# Patient Record
Sex: Female | Born: 1970
Health system: Southern US, Community
[De-identification: ages and names within clinical notes are randomized; demographics above are authoritative.]

## PROBLEM LIST (undated history)

## (undated) DIAGNOSIS — Z973 Presence of spectacles and contact lenses: Secondary | ICD-10-CM

## (undated) DIAGNOSIS — M1990 Unspecified osteoarthritis, unspecified site: Secondary | ICD-10-CM

## (undated) DIAGNOSIS — K219 Gastro-esophageal reflux disease without esophagitis: Secondary | ICD-10-CM

## (undated) DIAGNOSIS — E669 Obesity, unspecified: Secondary | ICD-10-CM

## (undated) DIAGNOSIS — I10 Essential (primary) hypertension: Secondary | ICD-10-CM

## (undated) DIAGNOSIS — G8929 Other chronic pain: Secondary | ICD-10-CM

## (undated) DIAGNOSIS — O00109 Unspecified tubal pregnancy without intrauterine pregnancy: Secondary | ICD-10-CM

## (undated) DIAGNOSIS — F419 Anxiety disorder, unspecified: Secondary | ICD-10-CM

## (undated) DIAGNOSIS — M549 Dorsalgia, unspecified: Secondary | ICD-10-CM

## (undated) DIAGNOSIS — Z87891 Personal history of nicotine dependence: Secondary | ICD-10-CM

## (undated) HISTORY — DX: Gastro-esophageal reflux disease without esophagitis: K21.9

## (undated) HISTORY — DX: Anxiety disorder, unspecified: F41.9

## (undated) HISTORY — DX: Unspecified osteoarthritis, unspecified site: M19.90

## (undated) HISTORY — DX: Presence of spectacles and contact lenses: Z97.3

## (undated) HISTORY — DX: Dorsalgia, unspecified: M54.9

## (undated) HISTORY — DX: Other chronic pain: G89.29

## (undated) HISTORY — DX: Obesity, unspecified: E66.9

## (undated) HISTORY — DX: Personal history of nicotine dependence: Z87.891

---

## 1997-10-15 HISTORY — PX: ECTOPIC PREGNANCY SURGERY: SHX613

## 2012-05-27 ENCOUNTER — Emergency Department (HOSPITAL_COMMUNITY): Payer: No Typology Code available for payment source

## 2012-05-27 ENCOUNTER — Encounter (HOSPITAL_COMMUNITY): Payer: Self-pay

## 2012-05-27 ENCOUNTER — Emergency Department (HOSPITAL_COMMUNITY)
Admission: EM | Admit: 2012-05-27 | Discharge: 2012-05-27 | Disposition: A | Discharge status: Home / Self Care | Payer: No Typology Code available for payment source | Attending: Emergency Medicine | Admitting: Emergency Medicine

## 2012-05-27 DIAGNOSIS — IMO0002 Reserved for concepts with insufficient information to code with codable children: Secondary | ICD-10-CM

## 2012-05-27 DIAGNOSIS — Y998 Other external cause status: Secondary | ICD-10-CM | POA: Diagnosis not present

## 2012-05-27 DIAGNOSIS — F17200 Nicotine dependence, unspecified, uncomplicated: Secondary | ICD-10-CM | POA: Insufficient documentation

## 2012-05-27 DIAGNOSIS — Y93I9 Activity, other involving external motion: Secondary | ICD-10-CM | POA: Insufficient documentation

## 2012-05-27 DIAGNOSIS — S139XXA Sprain of joints and ligaments of unspecified parts of neck, initial encounter: Secondary | ICD-10-CM | POA: Insufficient documentation

## 2012-05-27 DIAGNOSIS — S61209A Unspecified open wound of unspecified finger without damage to nail, initial encounter: Secondary | ICD-10-CM | POA: Diagnosis not present

## 2012-05-27 DIAGNOSIS — E119 Type 2 diabetes mellitus without complications: Secondary | ICD-10-CM | POA: Insufficient documentation

## 2012-05-27 DIAGNOSIS — Z043 Encounter for examination and observation following other accident: Secondary | ICD-10-CM | POA: Diagnosis present

## 2012-05-27 DIAGNOSIS — I10 Essential (primary) hypertension: Secondary | ICD-10-CM | POA: Diagnosis not present

## 2012-05-27 HISTORY — DX: Essential (primary) hypertension: I10

## 2012-05-27 MED ORDER — IBUPROFEN 800 MG PO TABS: 800.0000 mg | ORAL_TABLET | Freq: Three times a day (TID) | ORAL | Status: AC | PRN

## 2012-05-27 MED ORDER — OXYCODONE-ACETAMINOPHEN 5-325 MG PO TABS: 1.0000 | ORAL_TABLET | Freq: Once | ORAL | Status: DC

## 2012-05-27 MED ORDER — IBUPROFEN 800 MG PO TABS
800.0000 mg | ORAL_TABLET | Freq: Once | ORAL | Status: AC
  Administered 2012-05-27: 800 mg via ORAL
  Filled 2012-05-27: qty 1

## 2012-05-27 MED ORDER — ACETAMINOPHEN 325 MG PO TABS
975.0000 mg | ORAL_TABLET | Freq: Once | ORAL | Status: AC
  Administered 2012-05-27: 975 mg via ORAL
  Filled 2012-05-27: qty 3
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
## 2012-05-27 NOTE — ED Notes (Signed)
Pt ambulatory at discharge, pt discharged with son and spouse
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
## 2012-06-12 ENCOUNTER — Emergency Department (HOSPITAL_COMMUNITY)
Admission: EM | Admit: 2012-06-12 | Discharge: 2012-06-12 | Disposition: A | Discharge status: Home / Self Care | Payer: No Typology Code available for payment source | Attending: Emergency Medicine | Admitting: Emergency Medicine

## 2012-06-12 ENCOUNTER — Encounter (HOSPITAL_COMMUNITY): Payer: Self-pay | Admitting: *Deleted

## 2012-06-12 DIAGNOSIS — I10 Essential (primary) hypertension: Secondary | ICD-10-CM | POA: Insufficient documentation

## 2012-06-12 DIAGNOSIS — F17200 Nicotine dependence, unspecified, uncomplicated: Secondary | ICD-10-CM | POA: Insufficient documentation

## 2012-06-12 DIAGNOSIS — E119 Type 2 diabetes mellitus without complications: Secondary | ICD-10-CM | POA: Insufficient documentation

## 2012-06-12 DIAGNOSIS — M62838 Other muscle spasm: Secondary | ICD-10-CM | POA: Insufficient documentation

## 2012-06-12 HISTORY — DX: Unspecified tubal pregnancy without intrauterine pregnancy: O00.109

## 2012-06-12 MED ORDER — HYDROCODONE-ACETAMINOPHEN 5-325 MG PO TABS
2.0000 | ORAL_TABLET | Freq: Once | ORAL | Status: AC
  Administered 2012-06-12: 2 via ORAL
  Filled 2012-06-12: qty 2

## 2012-06-12 MED ORDER — DIAZEPAM 5 MG PO TABS
10.0000 mg | ORAL_TABLET | Freq: Once | ORAL | Status: AC
  Administered 2012-06-12: 10 mg via ORAL
  Filled 2012-06-12: qty 2

## 2012-06-12 MED ORDER — ONDANSETRON 4 MG PO TBDP
8.0000 mg | ORAL_TABLET | Freq: Once | ORAL | Status: AC
  Administered 2012-06-12: 8 mg via ORAL
  Filled 2012-06-12: qty 2

## 2012-06-12 MED ORDER — DIAZEPAM 5 MG/ML IJ SOLN
10.0000 mg | Freq: Once | INTRAMUSCULAR | Status: AC
  Administered 2012-06-12: 10 mg via INTRAMUSCULAR
  Filled 2012-06-12: qty 2

## 2012-06-12 MED ORDER — DIAZEPAM 5 MG PO TABS: 5.0000 mg | ORAL_TABLET | Freq: Four times a day (QID) | ORAL | Status: AC | PRN

## 2012-06-12 MED ORDER — HYDROCODONE-ACETAMINOPHEN 5-325 MG PO TABS: 2.0000 | ORAL_TABLET | ORAL | Status: AC | PRN

## 2012-06-12 MED ORDER — MORPHINE SULFATE 4 MG/ML IJ SOLN
4.0000 mg | Freq: Once | INTRAMUSCULAR | Status: AC
  Administered 2012-06-12: 4 mg via INTRAMUSCULAR
  Filled 2012-06-12: qty 1

## 2012-06-12 NOTE — ED Provider Notes (Signed)
Medical screening examination/treatment/procedure(s) were performed by non-physician practitioner and as supervising physician I was immediately available for consultation/collaboration.  Phinneas Shakoor, MD 06/12/12 0548 
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
## 2012-06-12 NOTE — ED Notes (Signed)
PT. REFUSED VALIUM AND MORPHINE INJECTION AS WELL AS ZOFRAN ODT , REQUESTED ORAL PAIN MEDICATION - P. DAMMEN PA NOTIFIED.
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
## 2012-08-17 ENCOUNTER — Other Ambulatory Visit: Payer: Self-pay | Admitting: Oncology

## 2012-10-31 ENCOUNTER — Encounter: Payer: Self-pay | Admitting: Medical

## 2012-10-31 ENCOUNTER — Ambulatory Visit (INDEPENDENT_AMBULATORY_CARE_PROVIDER_SITE_OTHER): Payer: Commercial Managed Care - PPO | Admitting: Medical

## 2012-10-31 VITALS — BP 130/70 | HR 83 | Temp 98.2°F | Resp 14 | Ht 66.0 in | Wt 214.0 lb

## 2012-10-31 DIAGNOSIS — I10 Essential (primary) hypertension: Secondary | ICD-10-CM

## 2012-10-31 DIAGNOSIS — F17200 Nicotine dependence, unspecified, uncomplicated: Secondary | ICD-10-CM

## 2012-10-31 DIAGNOSIS — R42 Dizziness and giddiness: Secondary | ICD-10-CM

## 2012-10-31 DIAGNOSIS — E119 Type 2 diabetes mellitus without complications: Secondary | ICD-10-CM

## 2012-10-31 LAB — CBC WITH DIFFERENTIAL/PLATELET
Eosinophils Relative: 4 % (ref 0–5)
HCT: 39.9 % (ref 36.0–46.0)
Hemoglobin: 13.1 g/dL (ref 12.0–15.0)
Lymphocytes Relative: 40 % (ref 12–46)
Lymphs Abs: 2.5 10*3/uL (ref 0.7–4.0)
MCV: 86 fL (ref 78.0–100.0)
Monocytes Absolute: 0.6 10*3/uL (ref 0.1–1.0)
Monocytes Relative: 10 % (ref 3–12)
Neutro Abs: 2.9 10*3/uL (ref 1.7–7.7)
RBC: 4.64 MIL/uL (ref 3.87–5.11)
WBC: 6.3 10*3/uL (ref 4.0–10.5)

## 2012-10-31 LAB — POCT URINALYSIS DIPSTICK
Bilirubin, UA: NEGATIVE
Blood, UA: NEGATIVE
Glucose, UA: NEGATIVE
Nitrite, UA: NEGATIVE
Spec Grav, UA: <=1.005
Urobilinogen, UA: NEGATIVE

## 2012-10-31 NOTE — Progress Notes (Signed)
Subjective:   HPI  Kelsey Olson is a 42 y.o. female who presents as a new patient today.  Moved from [place].  Diagnosed with diabetes in 2005.  Was originally on insulin and oral medication, but over time lost weight and did better with diet and exercise.  Not just taking metformin once daily.   She is on lisinopril once daily at 20mg  daily.  She thinks her medication is giving her problems.   Takes lisinopril on empty stomach in the morning, and within a short period of time gets dizziness and jitteriness.   At times feels like she may pass out, but hasn't fainted.  Sometimes seems like BP is low.  She doesn't check her BP regularly though.  She does check glucose which runs 90-115 fasting in the mornings.  She hasn't had labs in almost 2 years.  otherwise been in usual state of health without c/o. No other aggravating or relieving factors.    No other c/o.  The following portions of the patient's history were reviewed and updated as appropriate: allergies, current medications, past family history, past medical history, past social history, past surgical history and problem list.  Past Medical History  Diagnosis Date  . Hypertension   . Tubal pregnancy   . Tobacco use disorder   . Diabetes mellitus 2005  . Wears glasses   . Anxiety   . Chronic back pain     Allergies  Allergen Reactions  . Percocet (Oxycodone-Acetaminophen) Itching and Other (See Comments)    Hallucinations   Review of Systems Constitutional: -fever, -chills, -sweats, -unexpected weight change,-fatigue ENT: -runny nose, -ear pain, -sore throat Cardiology:  -chest pain, -palpitations, -edema Respiratory: -cough, -shortness of breath, -wheezing Gastroenterology: -abdominal pain, -nausea, -vomiting, -diarrhea, -constipation  Hematology: -bleeding or bruising problems Musculoskeletal: -arthralgias, -myalgias, -joint swelling, -back pain Ophthalmology: -vision changes Urology: -dysuria, -difficulty  urinating, -hematuria, -urinary frequency, -urgency Neurology: -headache, -weakness, -tingling, -numbness      Objective:   Physical Exam  General appearance: alert, no distress, WD/WN, pleasant AA female HEENT: normocephalic, sclerae anicteric, TMs pearly, nares patent, no discharge or erythema, pharynx normal Oral cavity: MMM, no lesions Neck: supple, no lymphadenopathy, no thyromegaly, no masses, no bruits Heart: RRR, normal S1, S2, no murmurs Lungs: CTA bilaterally, no wheezes, rhonchi, or rales Abdomen: +bs, soft, non tender, non distended, no masses, no hepatomegaly, no splenomegaly Pulses: 2+ symmetric, upper and lower extremities, normal cap refill Neuro: CN2-12 intact, nonfocal exam  Assessment and Plan :     Encounter Diagnoses  Name Primary?  . Type II or unspecified type diabetes mellitus without mention of complication, not stated as uncontrolled Yes  . Essential hypertension, benign   . Dizziness   . Tobacco use disorder    Labs today, c/t current medications.   Symptoms suggests possibly an intolerance to Lisinopril, but will await labs.  I asked her to records BP measurements and glucose measurements fasting and at bedtime and return these in 1wk for me to review.  Advised she work on quitting tobacco.  F/u pending labs.

## 2012-11-01 LAB — COMPREHENSIVE METABOLIC PANEL
ALT: 16 U/L (ref 0–35)
BUN: 9 mg/dL (ref 6–23)
CO2: 28 mEq/L (ref 19–32)
Calcium: 9.4 mg/dL (ref 8.4–10.5)
Creat: 0.97 mg/dL (ref 0.50–1.10)
Total Bilirubin: 0.4 mg/dL (ref 0.3–1.2)

## 2012-11-01 LAB — MICROALBUMIN / CREATININE URINE RATIO: Creatinine, Urine: 96.3 mg/dL

## 2012-11-01 LAB — LIPID PANEL
Cholesterol: 189 mg/dL (ref 0–200)
Total CHOL/HDL Ratio: 5 Ratio
Triglycerides: 143 mg/dL (ref <150)
VLDL: 29 mg/dL (ref 0–40)

## 2012-11-01 LAB — HEMOGLOBIN A1C: Mean Plasma Glucose: 146 mg/dL — ABNORMAL HIGH (ref <117)

## 2012-11-29 ENCOUNTER — Other Ambulatory Visit: Payer: Self-pay

## 2013-01-23 ENCOUNTER — Other Ambulatory Visit (INDEPENDENT_AMBULATORY_CARE_PROVIDER_SITE_OTHER): Payer: Commercial Managed Care - PPO

## 2013-01-23 DIAGNOSIS — Z111 Encounter for screening for respiratory tuberculosis: Secondary | ICD-10-CM

## 2013-03-01 ENCOUNTER — Emergency Department (HOSPITAL_COMMUNITY)
Admission: EM | Admit: 2013-03-01 | Discharge: 2013-03-02 | Disposition: A | Discharge status: Home / Self Care | Payer: Commercial Managed Care - PPO | Attending: Emergency Medicine | Admitting: Emergency Medicine

## 2013-03-01 ENCOUNTER — Encounter (HOSPITAL_COMMUNITY): Payer: Self-pay | Admitting: Emergency Medicine

## 2013-03-01 DIAGNOSIS — E119 Type 2 diabetes mellitus without complications: Secondary | ICD-10-CM | POA: Insufficient documentation

## 2013-03-01 DIAGNOSIS — I10 Essential (primary) hypertension: Secondary | ICD-10-CM

## 2013-03-01 DIAGNOSIS — G8929 Other chronic pain: Secondary | ICD-10-CM | POA: Insufficient documentation

## 2013-03-01 DIAGNOSIS — R51 Headache: Secondary | ICD-10-CM | POA: Insufficient documentation

## 2013-03-01 DIAGNOSIS — F411 Generalized anxiety disorder: Secondary | ICD-10-CM | POA: Insufficient documentation

## 2013-03-01 DIAGNOSIS — Z8742 Personal history of other diseases of the female genital tract: Secondary | ICD-10-CM | POA: Insufficient documentation

## 2013-03-01 DIAGNOSIS — Z79899 Other long term (current) drug therapy: Secondary | ICD-10-CM | POA: Insufficient documentation

## 2013-03-01 DIAGNOSIS — F17200 Nicotine dependence, unspecified, uncomplicated: Secondary | ICD-10-CM | POA: Insufficient documentation

## 2013-03-01 DIAGNOSIS — Z885 Allergy status to narcotic agent status: Secondary | ICD-10-CM | POA: Insufficient documentation

## 2013-03-01 NOTE — ED Provider Notes (Signed)
History     CSN: [id number]  Arrival date & time 03/01/13  2258   First MD Initiated Contact with Patient 03/01/13 2310      Chief Complaint  Patient presents with  . Hypertension    (Consider location/radiation/quality/duration/timing/severity/associated sxs/prior treatment) HPI Comments: Patient with a history of HTN presents today due to an elevated blood pressure.  She reports that she monitors her blood pressure at home and her blood pressure was 180/80 earlier at home.  She states that her blood pressure typically runs around 130/80.  She is currently on 20 mg Lisinopril daily.  She has been taking the medication as directed.  Last dose was earlier this morning.  Patient denies headache, SOB, chest pain, vision changes, or decreased urination at this time.    The history is provided by the patient.    Past Medical History  Diagnosis Date  . Hypertension   . Tubal pregnancy   . Tobacco use disorder   . Diabetes mellitus 2005  . Wears glasses   . Anxiety   . Chronic back pain     Past Surgical History  Procedure Laterality Date  . Total abdominal hysterectomy w/ bilateral salpingoophorectomy      R    No family history on file.  History  Substance Use Topics  . Smoking status: Current Every Day Smoker -- 0.50 packs/day  . Smokeless tobacco: Not on file  . Alcohol Use: No    OB History   Grav Para Term Preterm Abortions TAB SAB Ect Mult Living                  Review of Systems  Constitutional: Negative for fever and chills.  Respiratory: Negative for shortness of breath.   Cardiovascular: Negative for chest pain.  Genitourinary: Negative for decreased urine volume.  Neurological: Negative for dizziness, syncope, light-headedness and headaches.  All other systems reviewed and are negative.    Allergies  Percocet  Home Medications   Current Outpatient Rx  Name  Route  Sig  Dispense  Refill  . lisinopril (PRINIVIL,ZESTRIL) 20 MG tablet   Oral  Take 20 mg by mouth daily.         . metFORMIN (GLUCOPHAGE) 500 MG tablet   Oral   Take 500 mg by mouth daily with breakfast.          . Multiple Vitamin (MULTIVITAMIN WITH MINERALS) TABS   Oral   Take 1 tablet by mouth daily.           BP 189/72  Pulse 82  Temp(Src) 98.2 F (36.8 C) (Oral)  Resp 16  SpO2 99%  Physical Exam  Nursing note and vitals reviewed. Constitutional: She appears well-developed and well-nourished. No distress.  HENT:  Head: Normocephalic and atraumatic.  Eyes: EOM are normal. Pupils are equal, round, and reactive to light.  Fundoscopic exam:      The right eye shows no hemorrhage and no papilledema.       The left eye shows no hemorrhage and no papilledema.  Neck: Normal range of motion. Neck supple.  Cardiovascular: Normal rate, regular rhythm, normal heart sounds and intact distal pulses.   Pulmonary/Chest: Effort normal and breath sounds normal. No respiratory distress. She has no wheezes. She has no rales.  Abdominal: Soft. There is no tenderness.  Neurological: She is alert. She has normal strength. No cranial nerve deficit or sensory deficit. Coordination and gait normal.  Skin: Skin is warm and dry. She is not  diaphoretic.  Psychiatric: She has a normal mood and affect.    ED Course  Procedures (including critical care time)  Labs Reviewed - No data to display No results found.   No diagnosis found.  11:41 PM Repeat blood pressure 140/70  MDM  Patient with a history of HTN presenting with a chief complaint of elevated blood pressure.  She is currently asymptomatic.  No signs of end organ damage.  Therefore, feel that the patient is stable for discharge.  Patient instructed to keep track of her blood pressure in a log and follow up with her PCP.        Pascal Lux [place], PA-C 03/02/13 2158

## 2013-03-01 NOTE — ED Notes (Signed)
Patient states he has had a moderate to severe HA and HTN since late yesterday evening. Pt states she takes 20 mg Lisinopril in at a.m. States tonight her BP has been as high as 180/90. States normally her BP is well controlled and averages around 130/70. States she has been taking her Lisinopril exactly as prescribed. Ax4, NAD.

## 2013-03-03 ENCOUNTER — Telehealth: Payer: Self-pay | Admitting: Family Medicine

## 2013-03-03 NOTE — Telephone Encounter (Signed)
Message copied by Janeice Robinson on Tue Mar 03, 2013  3:21 PM ------      Message from: Aleen Campi, DAVID S      Created: Mon Mar 02, 2013  7:14 AM       Need ED f/u OV here.  She came here as new patient in 10/2012.    ------

## 2013-03-03 NOTE — Telephone Encounter (Signed)
Tried to call this patient no answer no voicemail. CLS

## 2013-03-04 NOTE — ED Provider Notes (Signed)
History/physical exam/procedure(s) were performed by non-physician practitioner and as supervising physician I was immediately available for consultation/collaboration. I have reviewed all notes and am in agreement with care and plan.   Hilario Quarry, MD 03/04/13 [phone]

## 2013-03-10 ENCOUNTER — Telehealth: Payer: Self-pay | Admitting: Internal Medicine

## 2013-03-10 MED ORDER — METFORMIN HCL 500 MG PO TABS: 500.0000 mg | ORAL_TABLET | Freq: Every day | ORAL | Status: DC

## 2013-03-10 NOTE — Telephone Encounter (Signed)
Pt has 2 pills left and has appt next Wednesday June 4th so i have sent med for a 30 day supply to pharmacy

## 2013-03-11 ENCOUNTER — Institutional Professional Consult (permissible substitution): Payer: Commercial Managed Care - PPO | Admitting: Medical

## 2013-03-18 ENCOUNTER — Ambulatory Visit (INDEPENDENT_AMBULATORY_CARE_PROVIDER_SITE_OTHER): Payer: Commercial Managed Care - PPO | Admitting: Medical

## 2013-03-18 ENCOUNTER — Encounter: Payer: Self-pay | Admitting: Medical

## 2013-03-18 VITALS — BP 112/70 | HR 76 | Temp 98.4°F | Resp 16 | Wt 210.0 lb

## 2013-03-18 DIAGNOSIS — M47812 Spondylosis without myelopathy or radiculopathy, cervical region: Secondary | ICD-10-CM

## 2013-03-18 DIAGNOSIS — F17200 Nicotine dependence, unspecified, uncomplicated: Secondary | ICD-10-CM

## 2013-03-18 DIAGNOSIS — M79645 Pain in left finger(s): Secondary | ICD-10-CM

## 2013-03-18 DIAGNOSIS — M79609 Pain in unspecified limb: Secondary | ICD-10-CM

## 2013-03-18 DIAGNOSIS — E119 Type 2 diabetes mellitus without complications: Secondary | ICD-10-CM

## 2013-03-18 DIAGNOSIS — I10 Essential (primary) hypertension: Secondary | ICD-10-CM

## 2013-03-18 DIAGNOSIS — E785 Hyperlipidemia, unspecified: Secondary | ICD-10-CM

## 2013-03-18 LAB — LIPID PANEL
Cholesterol: 156 mg/dL (ref 0–200)
HDL: 35 mg/dL — ABNORMAL LOW (ref >39)
Total CHOL/HDL Ratio: 4.5 Ratio

## 2013-03-18 LAB — HEMOGLOBIN A1C
Hgb A1c MFr Bld: 5.8 % — ABNORMAL HIGH (ref <5.7)
Mean Plasma Glucose: 120 mg/dL — ABNORMAL HIGH (ref <117)
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
## 2013-03-19 ENCOUNTER — Other Ambulatory Visit: Payer: Self-pay | Admitting: Medical

## 2013-03-19 MED ORDER — METFORMIN HCL 500 MG PO TABS: 500.0000 mg | ORAL_TABLET | Freq: Every day | ORAL | Status: DC

## 2013-03-19 MED ORDER — LISINOPRIL 20 MG PO TABS: 20.0000 mg | ORAL_TABLET | Freq: Every day | ORAL | Status: DC

## 2013-03-19 MED ORDER — ATORVASTATIN CALCIUM 20 MG PO TABS: 20.0000 mg | ORAL_TABLET | Freq: Every day | ORAL | Status: DC

## 2013-04-08 ENCOUNTER — Encounter: Payer: Self-pay | Admitting: Medical

## 2013-04-08 ENCOUNTER — Ambulatory Visit (INDEPENDENT_AMBULATORY_CARE_PROVIDER_SITE_OTHER): Payer: Commercial Managed Care - PPO | Admitting: Medical

## 2013-04-08 VITALS — BP 136/70 | HR 100 | Temp 98.3°F | Resp 16 | Wt 210.0 lb

## 2013-04-08 DIAGNOSIS — M25561 Pain in right knee: Secondary | ICD-10-CM

## 2013-04-08 DIAGNOSIS — M47812 Spondylosis without myelopathy or radiculopathy, cervical region: Secondary | ICD-10-CM

## 2013-04-08 DIAGNOSIS — E669 Obesity, unspecified: Secondary | ICD-10-CM

## 2013-04-08 DIAGNOSIS — M25569 Pain in unspecified knee: Secondary | ICD-10-CM

## 2013-04-08 DIAGNOSIS — M549 Dorsalgia, unspecified: Secondary | ICD-10-CM

## 2013-04-08 MED ORDER — MELOXICAM 7.5 MG PO TABS: ORAL_TABLET | ORAL | Status: DC

## 2013-04-08 MED ORDER — METFORMIN HCL 500 MG PO TABS: 500.0000 mg | ORAL_TABLET | Freq: Every day | ORAL | Status: DC
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
## 2013-04-09 ENCOUNTER — Emergency Department (HOSPITAL_COMMUNITY)
Admission: EM | Admit: 2013-04-09 | Discharge: 2013-04-09 | Disposition: A | Discharge status: Home / Self Care | Payer: Commercial Managed Care - PPO | Attending: Emergency Medicine | Admitting: Emergency Medicine

## 2013-04-09 ENCOUNTER — Emergency Department (HOSPITAL_COMMUNITY): Payer: Commercial Managed Care - PPO

## 2013-04-09 ENCOUNTER — Encounter (HOSPITAL_COMMUNITY): Payer: Self-pay | Admitting: *Deleted

## 2013-04-09 DIAGNOSIS — G8929 Other chronic pain: Secondary | ICD-10-CM | POA: Insufficient documentation

## 2013-04-09 DIAGNOSIS — R42 Dizziness and giddiness: Secondary | ICD-10-CM | POA: Insufficient documentation

## 2013-04-09 DIAGNOSIS — R109 Unspecified abdominal pain: Secondary | ICD-10-CM | POA: Insufficient documentation

## 2013-04-09 DIAGNOSIS — Z79899 Other long term (current) drug therapy: Secondary | ICD-10-CM | POA: Insufficient documentation

## 2013-04-09 DIAGNOSIS — Z789 Other specified health status: Secondary | ICD-10-CM | POA: Insufficient documentation

## 2013-04-09 DIAGNOSIS — I10 Essential (primary) hypertension: Secondary | ICD-10-CM | POA: Insufficient documentation

## 2013-04-09 DIAGNOSIS — Z8659 Personal history of other mental and behavioral disorders: Secondary | ICD-10-CM | POA: Insufficient documentation

## 2013-04-09 DIAGNOSIS — Z3202 Encounter for pregnancy test, result negative: Secondary | ICD-10-CM | POA: Insufficient documentation

## 2013-04-09 DIAGNOSIS — F17200 Nicotine dependence, unspecified, uncomplicated: Secondary | ICD-10-CM | POA: Insufficient documentation

## 2013-04-09 DIAGNOSIS — E119 Type 2 diabetes mellitus without complications: Secondary | ICD-10-CM | POA: Insufficient documentation

## 2013-04-09 LAB — URINALYSIS, ROUTINE W REFLEX MICROSCOPIC
Glucose, UA: NEGATIVE mg/dL
Hgb urine dipstick: NEGATIVE
Leukocytes, UA: NEGATIVE
Protein, ur: NEGATIVE mg/dL
pH: 6 (ref 5.0–8.0)
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
## 2013-04-09 NOTE — ED Notes (Signed)
Pt also c/o discomfort under left breast.  St's feels like a bubble.  Onset while waiting to be seen for back pain.  Pt st's she was at home this pm when she felt like she was having a spasm in lower back.  St's pain better now just feels tight.
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
## 2013-04-09 NOTE — ED Notes (Signed)
Pt denies any dizziness

## 2013-04-14 ENCOUNTER — Ambulatory Visit: Payer: No Typology Code available for payment source | Attending: Medical

## 2013-04-14 DIAGNOSIS — M542 Cervicalgia: Secondary | ICD-10-CM | POA: Insufficient documentation

## 2013-04-14 DIAGNOSIS — R5381 Other malaise: Secondary | ICD-10-CM | POA: Insufficient documentation

## 2013-04-14 DIAGNOSIS — M545 Low back pain, unspecified: Secondary | ICD-10-CM | POA: Insufficient documentation

## 2013-04-14 DIAGNOSIS — IMO0001 Reserved for inherently not codable concepts without codable children: Secondary | ICD-10-CM | POA: Insufficient documentation

## 2013-04-14 DIAGNOSIS — M25569 Pain in unspecified knee: Secondary | ICD-10-CM | POA: Insufficient documentation

## 2013-04-15 ENCOUNTER — Ambulatory Visit: Payer: No Typology Code available for payment source | Admitting: Physical Therapy

## 2013-04-15 ENCOUNTER — Telehealth: Payer: Self-pay | Admitting: Medical

## 2013-04-15 NOTE — Telephone Encounter (Signed)
Recommendations:  Increase water and fiber intake  Can use short term Colace or Docusate OTC for stool softener  If desired can use OTC saline enema  Can eat some prunes or drink some prune juice, or can try few tablespoons of molasses  If none of the above is improving, can begin 1 cap full of Miralax OTC

## 2013-04-15 NOTE — Telephone Encounter (Signed)
Pt called and stated that she has been constipated for a couple of days and would like a recommendation for something otc. Please call pt.

## 2013-04-20 NOTE — Telephone Encounter (Signed)
Patient is aware of Shane Tysinger PAC recommendations. CLS 

## 2013-04-21 ENCOUNTER — Ambulatory Visit: Payer: No Typology Code available for payment source | Admitting: Physical Therapy

## 2013-04-23 ENCOUNTER — Ambulatory Visit: Payer: No Typology Code available for payment source

## 2013-04-29 ENCOUNTER — Ambulatory Visit: Payer: No Typology Code available for payment source | Admitting: Physical Therapy

## 2013-04-30 ENCOUNTER — Ambulatory Visit: Payer: No Typology Code available for payment source

## 2013-05-06 ENCOUNTER — Ambulatory Visit: Payer: No Typology Code available for payment source | Admitting: Physical Therapy

## 2013-05-07 ENCOUNTER — Ambulatory Visit: Payer: No Typology Code available for payment source

## 2013-05-11 ENCOUNTER — Ambulatory Visit: Payer: No Typology Code available for payment source

## 2013-05-13 ENCOUNTER — Ambulatory Visit: Payer: No Typology Code available for payment source | Admitting: Physical Therapy

## 2013-05-15 ENCOUNTER — Ambulatory Visit: Payer: No Typology Code available for payment source | Attending: Medical | Admitting: Physical Therapy

## 2013-05-15 DIAGNOSIS — M542 Cervicalgia: Secondary | ICD-10-CM | POA: Insufficient documentation

## 2013-05-15 DIAGNOSIS — M545 Low back pain, unspecified: Secondary | ICD-10-CM | POA: Insufficient documentation

## 2013-05-15 DIAGNOSIS — M25569 Pain in unspecified knee: Secondary | ICD-10-CM | POA: Insufficient documentation

## 2013-05-15 DIAGNOSIS — IMO0001 Reserved for inherently not codable concepts without codable children: Secondary | ICD-10-CM | POA: Insufficient documentation

## 2013-05-15 DIAGNOSIS — R5381 Other malaise: Secondary | ICD-10-CM | POA: Insufficient documentation

## 2013-05-20 ENCOUNTER — Ambulatory Visit: Payer: No Typology Code available for payment source | Admitting: Physical Therapy

## 2013-05-22 ENCOUNTER — Ambulatory Visit: Payer: No Typology Code available for payment source | Admitting: Physical Therapy

## 2013-06-10 ENCOUNTER — Other Ambulatory Visit: Payer: Self-pay | Admitting: Orthopedic Surgery

## 2013-06-10 DIAGNOSIS — M25562 Pain in left knee: Secondary | ICD-10-CM

## 2013-06-10 DIAGNOSIS — M25561 Pain in right knee: Secondary | ICD-10-CM

## 2013-06-13 ENCOUNTER — Other Ambulatory Visit: Payer: Commercial Managed Care - PPO

## 2013-06-18 ENCOUNTER — Telehealth: Payer: Self-pay | Admitting: Internal Medicine

## 2013-06-18 NOTE — Telephone Encounter (Signed)
Call in test strips

## 2013-06-18 NOTE — Telephone Encounter (Signed)
Pt states she needs test strips for rely on consirm mirco. It is a [facility] brand so she needs it sent to [facility] [address]

## 2013-06-19 ENCOUNTER — Telehealth: Payer: Self-pay | Admitting: Family Medicine

## 2013-06-19 NOTE — Telephone Encounter (Signed)
I fax over her Rx for test strips to her [facility] pharmacy per the patients request. CLS

## 2013-06-19 NOTE — Telephone Encounter (Signed)
I change the patients pharmacy to call out a rx for test strips. CLS

## 2013-06-21 ENCOUNTER — Other Ambulatory Visit: Payer: Commercial Managed Care - PPO

## 2013-08-20 ENCOUNTER — Other Ambulatory Visit: Payer: Self-pay

## 2013-09-12 ENCOUNTER — Emergency Department (HOSPITAL_COMMUNITY)
Admission: EM | Admit: 2013-09-12 | Discharge: 2013-09-13 | Disposition: A | Discharge status: Home / Self Care | Payer: Commercial Managed Care - PPO | Attending: Emergency Medicine | Admitting: Emergency Medicine

## 2013-09-12 DIAGNOSIS — G8929 Other chronic pain: Secondary | ICD-10-CM | POA: Insufficient documentation

## 2013-09-12 DIAGNOSIS — F410 Panic disorder [episodic paroxysmal anxiety] without agoraphobia: Secondary | ICD-10-CM | POA: Insufficient documentation

## 2013-09-12 DIAGNOSIS — E119 Type 2 diabetes mellitus without complications: Secondary | ICD-10-CM | POA: Insufficient documentation

## 2013-09-12 DIAGNOSIS — I10 Essential (primary) hypertension: Secondary | ICD-10-CM | POA: Insufficient documentation

## 2013-09-12 DIAGNOSIS — F17200 Nicotine dependence, unspecified, uncomplicated: Secondary | ICD-10-CM | POA: Insufficient documentation

## 2013-09-12 DIAGNOSIS — Z79899 Other long term (current) drug therapy: Secondary | ICD-10-CM | POA: Insufficient documentation

## 2013-09-12 LAB — POCT I-STAT, CHEM 8
BUN: 13 mg/dL (ref 6–23)
Calcium, Ion: 1.26 mmol/L — ABNORMAL HIGH (ref 1.12–1.23)
Chloride: 102 mEq/L (ref 96–112)
Creatinine, Ser: 1.3 mg/dL — ABNORMAL HIGH (ref 0.50–1.10)
TCO2: 29 mmol/L (ref 0–100)

## 2013-09-12 LAB — POCT I-STAT TROPONIN I

## 2013-09-12 NOTE — ED Notes (Signed)
While watching TV experienced dizziness; checked hr (136), bp 127/65. No cp, no sob. Pt. Ambulatory.

## 2013-09-13 LAB — URINALYSIS, ROUTINE W REFLEX MICROSCOPIC
Bilirubin Urine: NEGATIVE
Hgb urine dipstick: NEGATIVE
Nitrite: NEGATIVE
Specific Gravity, Urine: 1.01 (ref 1.005–1.030)
pH: 5.5 (ref 5.0–8.0)

## 2013-09-13 MED ORDER — SODIUM CHLORIDE 0.9 % IV BOLUS (SEPSIS)
1000.0000 mL | Freq: Once | INTRAVENOUS | Status: AC
  Administered 2013-09-13: 1000 mL via INTRAVENOUS

## 2013-09-13 NOTE — ED Provider Notes (Signed)
CSN: [id number]     Arrival date & time 09/12/13  2247 History   First MD Initiated Contact with Patient 09/12/13 2330     Chief Complaint  Patient presents with  . Tachycardia  . Dizziness   (Consider location/radiation/quality/duration/timing/severity/associated sxs/prior Treatment) HPI History provided by pt.   Pt reports that ~75min prior to arrival, she was sitting on her couch watching TV when she developed lightheadedness, hot flash, hyperventilation, tremulousness and sensation that she had to have  BM.  Sx gradually improved over several minutes, she checked her BP and it was nml, but HR 136.  Sx have been waxing and waning since then but first episode most severe.  Has not had CP, SOB, palpitations, N/V, diaphoresis.  Has had these sx in the past but to a much milder degree and has been diagnosed w/ anxiety.  Also has h/o diabetes and HTN.  Past Medical History  Diagnosis Date  . Hypertension   . Tubal pregnancy   . Tobacco use disorder   . Diabetes mellitus 2005  . Wears glasses   . Anxiety   . Chronic back pain    Past Surgical History  Procedure Laterality Date  . Total abdominal hysterectomy w/ bilateral salpingoophorectomy      R   No family history on file. History  Substance Use Topics  . Smoking status: Current Every Day Smoker -- 0.50 packs/day  . Smokeless tobacco: Not on file  . Alcohol Use: No   OB History   Grav Para Term Preterm Abortions TAB SAB Ect Mult Living                 Review of Systems  All other systems reviewed and are negative.    Allergies  Percocet  Home Medications   Current Outpatient Rx  Name  Route  Sig  Dispense  Refill  . ibuprofen (ADVIL,MOTRIN) 200 MG tablet   Oral   Take 200-400 mg by mouth every 6 (six) hours as needed for headache.         . lisinopril (PRINIVIL,ZESTRIL) 20 MG tablet   Oral   Take 1 tablet (20 mg total) by mouth daily.   90 tablet   3   . metFORMIN (GLUCOPHAGE) 500 MG tablet   Oral  Take 1 tablet (500 mg total) by mouth daily with breakfast.   90 tablet   1    BP 156/68  Pulse 87  Temp(Src) 98.4 F (36.9 C) (Oral)  Resp 16  Ht 5\' 7"  (1.702 m)  Wt 216 lb (97.977 kg)  BMI 33.82 kg/m2  SpO2 99%  LMP 08/21/2013 Physical Exam  Nursing note and vitals reviewed. Constitutional: She is oriented to person, place, and time. She appears well-developed and well-nourished. No distress.  HENT:  Head: Normocephalic and atraumatic.  Eyes:  Normal appearance  Neck: Normal range of motion.  Cardiovascular: Normal rate, regular rhythm and intact distal pulses.   HR 86  Pulmonary/Chest: Effort normal and breath sounds normal. No respiratory distress.  No pleuritic pain reported  Abdominal: Soft. Bowel sounds are normal. She exhibits no distension. There is no tenderness. There is no guarding.  Musculoskeletal: Normal range of motion.  No peripheral edema or calf tenderness  Neurological: She is alert and oriented to person, place, and time.  Skin: Skin is warm and dry. No rash noted.  Psychiatric: She has a normal mood and affect. Her behavior is normal.  Pt does not appear anxious    ED Course  Procedures (including critical care time) Labs Review Labs Reviewed  POCT I-STAT, CHEM 8 - Abnormal; Notable for the following:    Creatinine, Ser 1.30 (*)    Glucose, Bld 143 (*)    Calcium, Ion 1.26 (*)    All other components within normal limits  POCT I-STAT TROPONIN I   Imaging Review No results found.  EKG Interpretation    Date/Time:  Saturday September 12 2013 22:52:34 EST Ventricular Rate:  82 PR Interval:  164 QRS Duration: 84 QT Interval:  352 QTC Calculation: 411 R Axis:   86 Text Interpretation:  Normal sinus rhythm with sinus arrhythmia Nonspecific T wave abnormality w isolated t wave inversion in III Abnormal ECG Confirmed by HARRISON  MD, FORREST ([id number]) on 09/12/2013 11:14:49 PM            MDM   1. Anxiety attack    42yo F w/ anxiety,  HTN and diabetes presents w/ intermittent lightheadedness, hot flashes, tremulousness and hyperventilation that started at rest ~18min prior to arrival.  Checked BP at home following initial episode and nml, but HR 136.  Has had these sx in the past but to a milder degree. Denies CP, SOB, palpitations and syncope.  Currently asx and no significant exam findings; all VS currently w/in nml range.  EKG unremarkable and labs sig for elevated Cr.  Pt aware of all test results.  Discussed case w/ Dr. Romeo Apple who recommends IVF and then d/c home to f/u with PCP.  12:19 AM   Pt reports feeling better.  D/c'd home.  Recommended oral fluids and f/u with PCP for Cr recheck.  Return precautions discussed.     Otilio Miu, PA-C 09/13/13 [phone]

## 2013-09-14 NOTE — ED Provider Notes (Signed)
Medical screening examination/treatment/procedure(s) were performed by non-physician practitioner and as supervising physician I was immediately available for consultation/collaboration.  EKG Interpretation    Date/Time:  Saturday September 12 2013 22:52:34 EST Ventricular Rate:  82 PR Interval:  164 QRS Duration: 84 QT Interval:  352 QTC Calculation: 411 R Axis:   86 Text Interpretation:  Normal sinus rhythm with sinus arrhythmia Nonspecific T wave abnormality w isolated t wave inversion in III Abnormal ECG Confirmed by Romeo Apple  MD, Akeela Busk ([id number]) on 09/12/2013 11:14:49 PM              Randa Spike Mort Sawyers, MD 09/14/13 [phone]

## 2013-09-15 ENCOUNTER — Ambulatory Visit (INDEPENDENT_AMBULATORY_CARE_PROVIDER_SITE_OTHER): Payer: Commercial Managed Care - PPO | Admitting: Medical

## 2013-09-15 ENCOUNTER — Encounter: Payer: Self-pay | Admitting: Medical

## 2013-09-15 VITALS — BP 122/70 | HR 82 | Temp 97.6°F | Resp 16 | Wt 218.0 lb

## 2013-09-15 DIAGNOSIS — E860 Dehydration: Secondary | ICD-10-CM

## 2013-09-15 DIAGNOSIS — R5381 Other malaise: Secondary | ICD-10-CM

## 2013-09-15 DIAGNOSIS — N289 Disorder of kidney and ureter, unspecified: Secondary | ICD-10-CM

## 2013-09-15 DIAGNOSIS — R059 Cough, unspecified: Secondary | ICD-10-CM

## 2013-09-15 DIAGNOSIS — R351 Nocturia: Secondary | ICD-10-CM

## 2013-09-15 DIAGNOSIS — R05 Cough: Secondary | ICD-10-CM

## 2013-09-15 DIAGNOSIS — E119 Type 2 diabetes mellitus without complications: Secondary | ICD-10-CM

## 2013-09-15 DIAGNOSIS — I10 Essential (primary) hypertension: Secondary | ICD-10-CM

## 2013-09-15 LAB — POCT URINALYSIS DIPSTICK
Glucose, UA: NEGATIVE
Leukocytes, UA: NEGATIVE
Protein, UA: NEGATIVE
Spec Grav, UA: <=1.005
Urobilinogen, UA: NEGATIVE

## 2013-09-15 NOTE — Progress Notes (Signed)
  Subjective:  Kelsey Olson is a 42 y.o. female who presents for hospital f/u.   Went to the ED few days ago with not feeling well.  Her creatinine was apparently elevated, slightly dehydrated, given IV fluids, and discharged home. Overall improved.   Cough - only occurs at nighttime when she lies down.  She does eat spicy foods,acidic foods, eats close to bedtime Nocturia - gets up few times at night to urinate, but no urinary frequency during the day.  No UTI symptoms HTN - compliant with medication, BPs run normal DM type II - doesn't feel good on metformin.   Highest pre meal number is 130.  Only takes metformin once daily, but has not taken the last 2 wk.  No other c/o.  The following portions of the patient's history were reviewed and updated as appropriate: allergies, current medications, past family history, past medical history, past social history, past surgical history and problem list.  ROS Otherwise as in subjective above  Objective: Physical Exam  BP 122/70  Pulse 82  Temp(Src) 97.6 F (36.4 C) (Oral)  Resp 16  Wt 218 lb (98.884 kg)  LMP 08/21/2013  General appearance: alert, no distress, WD/WN HEENT: normocephalic, sclerae anicteric, conjunctiva pink and moist, TMs pearly, nares patent, no discharge or erythema, pharynx normal Oral cavity: MMM, no lesions Neck: supple, no lymphadenopathy, no thyromegaly, no masses Heart: RRR, normal S1, S2, no murmurs Lungs: CTA bilaterally, no wheezes, rhonchi, or rales Abdomen: +bs, soft, non tender, non distended, no masses, no hepatomegaly, no splenomegaly Pulses: 2+ radial pulses, 2+ pedal pulses, normal cap refill Ext: no edema   Assessment: Encounter Diagnoses  Name Primary?  . Renal insufficiency Yes  . Dehydration   . Essential hypertension, benign   . Type II or unspecified type diabetes mellitus without mention of complication, not stated as uncontrolled    . Cough   . Nocturia   . Other malaise and fatigue      Plan: Renal insufficiency - reviewed recent ED report, labs.   She is currently hydrating well, feels improved.  Return in 1 wk for BMET  dehydration - improved HTN - c/t current medication, check BP readings and bring back numbers in 36mo DM type II - stop metformin, check glucose, c/t healthy diet, recheck 36mo. Cough - likely due to GERD . Discussed trigger avoidance, no eating or drinking within 2 hours of bedtime, can use some OTC Zantac, recheck 36mo Nocturia - seems to be related to fluid intake and not necessarily a bladder issue.  Discussed avoidance of liquids within 2-3 hours of bedtime Fatigue - TSH lab along BMET in 1wk Follow up: 36mo

## 2013-09-28 ENCOUNTER — Other Ambulatory Visit: Payer: Commercial Managed Care - PPO

## 2013-09-29 ENCOUNTER — Other Ambulatory Visit: Payer: Commercial Managed Care - PPO

## 2013-09-29 DIAGNOSIS — R899 Unspecified abnormal finding in specimens from other organs, systems and tissues: Secondary | ICD-10-CM

## 2013-09-29 DIAGNOSIS — R5381 Other malaise: Secondary | ICD-10-CM

## 2013-09-29 NOTE — Addendum Note (Signed)
Addended by: Lavell Islam on: 09/29/2013 03:56 PM   Modules accepted: Orders

## 2013-09-30 ENCOUNTER — Emergency Department (HOSPITAL_COMMUNITY)
Admission: EM | Admit: 2013-09-30 | Discharge: 2013-09-30 | Disposition: A | Discharge status: Home / Self Care | Payer: Commercial Managed Care - PPO | Attending: Emergency Medicine | Admitting: Emergency Medicine

## 2013-09-30 ENCOUNTER — Encounter (HOSPITAL_COMMUNITY): Payer: Self-pay | Admitting: Emergency Medicine

## 2013-09-30 DIAGNOSIS — F17200 Nicotine dependence, unspecified, uncomplicated: Secondary | ICD-10-CM | POA: Insufficient documentation

## 2013-09-30 DIAGNOSIS — E119 Type 2 diabetes mellitus without complications: Secondary | ICD-10-CM | POA: Insufficient documentation

## 2013-09-30 DIAGNOSIS — G8929 Other chronic pain: Secondary | ICD-10-CM | POA: Insufficient documentation

## 2013-09-30 DIAGNOSIS — H938X9 Other specified disorders of ear, unspecified ear: Secondary | ICD-10-CM | POA: Insufficient documentation

## 2013-09-30 DIAGNOSIS — Z8659 Personal history of other mental and behavioral disorders: Secondary | ICD-10-CM | POA: Insufficient documentation

## 2013-09-30 DIAGNOSIS — Z79899 Other long term (current) drug therapy: Secondary | ICD-10-CM | POA: Insufficient documentation

## 2013-09-30 DIAGNOSIS — H9190 Unspecified hearing loss, unspecified ear: Secondary | ICD-10-CM | POA: Insufficient documentation

## 2013-09-30 DIAGNOSIS — R42 Dizziness and giddiness: Secondary | ICD-10-CM | POA: Insufficient documentation

## 2013-09-30 DIAGNOSIS — H938X3 Other specified disorders of ear, bilateral: Secondary | ICD-10-CM

## 2013-09-30 DIAGNOSIS — I10 Essential (primary) hypertension: Secondary | ICD-10-CM | POA: Insufficient documentation

## 2013-09-30 LAB — BASIC METABOLIC PANEL
Calcium: 9 mg/dL (ref 8.4–10.5)
Glucose, Bld: 180 mg/dL — ABNORMAL HIGH (ref 70–99)
Potassium: 3.7 mEq/L (ref 3.5–5.3)
Sodium: 136 mEq/L (ref 135–145)

## 2013-09-30 LAB — TSH: TSH: 1.098 u[IU]/mL (ref 0.350–4.500)

## 2013-09-30 NOTE — ED Notes (Signed)
Reports difficulty hearing out of bilateral ears since 6:30pm.Denies ear pain.States she can hear but she feels like she is yelling when she talks.

## 2013-09-30 NOTE — ED Provider Notes (Signed)
CSN: [id number]     Arrival date & time 09/30/13  1926 History  This chart was scribed for non-physician practitioner Johnnette Gourd, working with Toy Baker, MD by Carl Best, ED Scribe. This patient was seen in room TR04C/TR04C and the patient's care was started at 8:31 PM.    Chief Complaint  Patient presents with  . Ear Fullness    The history is provided by the patient. No language interpreter was used.   HPI Comments: Kelsey Olson is a 42 y.o. female who presents to the Emergency Department complaining of bilateral hearing loss that started at 6:30 PM as the patient was getting out of the car.  The patient states that everything sounds "muffled".  She states that the sensation feels like a fullness.  She states that standing aggravates her hearing loss.  She denies chest pain, SOB, tinnitus, eye pain, and ear pain as associated symptoms.  She states that she was dizzy and lightheaded when she was at home and that her blood pressure was 180/92.  She states that those symptoms have since subsided.  She states that she had a dizzy spell yesterday while sitting down at work.  She states that she felt like she was spinning.  She states that the spell lasted for a couple of minutes and did not return today.  She denies experiencing a dizzy spell that severe in the past.     Past Medical History  Diagnosis Date  . Hypertension   . Tubal pregnancy   . Tobacco use disorder   . Diabetes mellitus 2005  . Wears glasses   . Anxiety   . Chronic back pain    Past Surgical History  Procedure Laterality Date  . Total abdominal hysterectomy w/ bilateral salpingoophorectomy      R   No family history on file. History  Substance Use Topics  . Smoking status: Current Every Day Smoker -- 0.50 packs/day  . Smokeless tobacco: Not on file  . Alcohol Use: No   OB History   Grav Para Term Preterm Abortions TAB SAB Ect Mult Living                 Review of Systems  HENT: Positive for  hearing loss. Negative for ear pain and tinnitus.   Eyes: Negative for pain.  Respiratory: Negative for shortness of breath.   Cardiovascular: Negative for chest pain.  Neurological: Negative for dizziness and light-headedness.  All other systems reviewed and are negative.    Allergies  Percocet  Home Medications   Current Outpatient Rx  Name  Route  Sig  Dispense  Refill  . ibuprofen (ADVIL,MOTRIN) 200 MG tablet   Oral   Take 200-400 mg by mouth every 6 (six) hours as needed for headache.         . lisinopril (PRINIVIL,ZESTRIL) 20 MG tablet   Oral   Take 1 tablet (20 mg total) by mouth daily.   90 tablet   3    Triage Vitals: BP 142/61  Pulse 80  Temp(Src) 98.2 F (36.8 C) (Oral)  Resp 16  Ht 5\' 5"  (1.651 m)  Wt 216 lb 11.2 oz (98.294 kg)  BMI 36.06 kg/m2  SpO2 100%  LMP 09/25/2013  Physical Exam  Nursing note and vitals reviewed. Constitutional: She is oriented to person, place, and time. She appears well-developed and well-nourished. No distress.  HENT:  Head: Normocephalic and atraumatic.  Right Ear: Hearing, tympanic membrane and ear canal normal.  Left Ear:  Hearing, tympanic membrane and ear canal normal.  Mouth/Throat: Oropharynx is clear and moist.  Eyes: Conjunctivae and EOM are normal. Pupils are equal, round, and reactive to light.  Neck: Normal range of motion. Neck supple.  Cardiovascular: Normal rate, regular rhythm and normal heart sounds.   Pulmonary/Chest: Effort normal and breath sounds normal. No respiratory distress.  Musculoskeletal: Normal range of motion. She exhibits no edema.  Neurological: She is alert and oriented to person, place, and time. No cranial nerve deficit or sensory deficit. She displays a negative Romberg sign.  Skin: Skin is warm and dry.  Psychiatric: She has a normal mood and affect. Her behavior is normal.    ED Course  Procedures (including critical care time)  DIAGNOSTIC STUDIES: Oxygen Saturation is 100% on  room air, normal by my interpretation.    COORDINATION OF CARE: 8:35 PM- Discussed speaking with the attending physician about the patient's symptoms and the patient agreed to the treatment plan.    Labs Review Labs Reviewed - No data to display Imaging Review No results found.  EKG Interpretation   None       MDM   1. Ear fullness, bilateral   2. Hypertension    Pt presenting with sensation of ear fullness, muffled hearing. PE unremarkable. BP in ED 142/61. Symptoms most likely related to BP changes. No associated HA, dizziness has not returned. She is not orthostatic. Advised f/u with PCP, discuss BP medications. Return precautions given. Patient states understanding of treatment care plan and is agreeable. Case discussed with attending Dr. Freida Busman who agrees with plan of care.   I personally performed the services described in this documentation, which was scribed in my presence. The recorded information has been reviewed and is accurate.   Trevor Mace, PA-C 09/30/13 [phone]

## 2013-10-02 ENCOUNTER — Ambulatory Visit (INDEPENDENT_AMBULATORY_CARE_PROVIDER_SITE_OTHER): Payer: Commercial Managed Care - PPO | Admitting: Medical

## 2013-10-02 VITALS — BP 128/74 | HR 76 | Temp 98.2°F | Wt 215.0 lb

## 2013-10-02 DIAGNOSIS — H9193 Unspecified hearing loss, bilateral: Secondary | ICD-10-CM

## 2013-10-02 DIAGNOSIS — H9190 Unspecified hearing loss, unspecified ear: Secondary | ICD-10-CM

## 2013-10-02 DIAGNOSIS — I10 Essential (primary) hypertension: Secondary | ICD-10-CM

## 2013-10-02 DIAGNOSIS — H8103 Meniere's disease, bilateral: Secondary | ICD-10-CM

## 2013-10-02 DIAGNOSIS — R42 Dizziness and giddiness: Secondary | ICD-10-CM

## 2013-10-02 DIAGNOSIS — H8109 Meniere's disease, unspecified ear: Secondary | ICD-10-CM

## 2013-10-02 MED ORDER — MECLIZINE HCL 25 MG PO TABS: 25.0000 mg | ORAL_TABLET | Freq: Two times a day (BID) | ORAL | Status: DC

## 2013-10-02 NOTE — Progress Notes (Signed)
Subjective: Here for dizziness.  Went to the ED twice recently for issues with BP, and few days ago visit for elevated BP, dizziness, and muffled hearing.  The night she went to the ED, had felt sudden hearing loss bilat/muffled hearing.  Denies chest pain, vision loss, numbness, tingling, weakness.  Ended up going to the ED.  They advised that BP was fine, and advised she f/u with [place].   Objective: Filed Vitals:   10/02/13 1136  BP: 128/74  Pulse: 76  Temp: 98.2 F (36.8 C)    General appearance: alert, no distress, WD/WN HEENT: normocephalic, sclerae anicteric, TMs pearly, nares patent, no discharge or erythema, pharynx normal Oral cavity: MMM, no lesions Neck: supple, no lymphadenopathy, no thyromegaly, no masses Heart: RRR, normal S1, S2, no murmurs Lungs: CTA bilaterally, no wheezes, rhonchi, or rales Pulses: 2+ symmetric, upper and lower extremities, normal cap refill Neuro: nonfocal exam Ext: no edema   Assessment: Encounter Diagnoses  Name Primary?  . Essential hypertension, benign Yes  . Hearing loss, bilateral   . Dizziness and giddiness   . Meniere disease, bilateral     Plan: Reviewed her recent emergency dept reports.  After reviewing her symptoms, the ED findings, her symptoms mostly suggest meniere's disease. Discussed diagnosis, treatment options, and at this time she declines HCTZ, but is willing to begin meclizine as a trial.   HTN - c/t same medication. Follow-up with call back next week.

## 2013-10-03 ENCOUNTER — Encounter: Payer: Self-pay | Admitting: Medical

## 2013-10-03 NOTE — Patient Instructions (Signed)
Meniere's Disease  You have Meniere's disease. Meniere's disease is a term for the recurrent symptoms (problems) of vertigo (the room or you seem to spin around), tinnitus (ringing in the ears), and hearing loss. The cause is unknown. These episodes often come on suddenly and without warning. They are sometimes associated with nausea (feeling sick to your stomach) and vomiting. There is no cure. A number of strategies may help the symptoms. Surgical help is available if conservative measures fail.  HOME CARE INSTRUCTIONS   · If you smoke, STOP.  · Restrict your salt intake.  · Stop caffeine consumption (caffeinated coffee, sodas, and chocolate).  · Do not drive during or near the time of attacks.  · Over the counter Antivert® (meclizine®) at a 25 mg dosage 3 times per day may be helpful.  SEEK IMMEDIATE MEDICAL CARE IF:   · Nausea and vomiting are continuous.  · You have no relief from vertigo.  MAKE SURE YOU:   · Understand these instructions.  · Will watch your condition.  · Will get help right away if you are not doing well or get worse.  Document Released: 09/28/2000 Document Revised: 12/24/2011 Document Reviewed: 10/01/2005  ExitCare® Patient Information ©2014 [employer].

## 2013-10-03 NOTE — ED Provider Notes (Signed)
Medical screening examination/treatment/procedure(s) were performed by non-physician practitioner and as supervising physician I was immediately available for consultation/collaboration.  Kennesha Brewbaker T Dee Paden, MD 10/03/13 1628 

## 2013-11-25 ENCOUNTER — Emergency Department (HOSPITAL_COMMUNITY)
Admission: EM | Admit: 2013-11-25 | Discharge: 2013-11-25 | Disposition: A | Discharge status: Home / Self Care | Payer: Commercial Managed Care - PPO | Attending: Emergency Medicine | Admitting: Emergency Medicine

## 2013-11-25 ENCOUNTER — Emergency Department (HOSPITAL_COMMUNITY): Payer: Commercial Managed Care - PPO

## 2013-11-25 ENCOUNTER — Encounter (HOSPITAL_COMMUNITY): Payer: Self-pay | Admitting: Emergency Medicine

## 2013-11-25 DIAGNOSIS — Z8739 Personal history of other diseases of the musculoskeletal system and connective tissue: Secondary | ICD-10-CM | POA: Insufficient documentation

## 2013-11-25 DIAGNOSIS — R109 Unspecified abdominal pain: Secondary | ICD-10-CM

## 2013-11-25 DIAGNOSIS — Z8742 Personal history of other diseases of the female genital tract: Secondary | ICD-10-CM | POA: Insufficient documentation

## 2013-11-25 DIAGNOSIS — I10 Essential (primary) hypertension: Secondary | ICD-10-CM | POA: Insufficient documentation

## 2013-11-25 DIAGNOSIS — E119 Type 2 diabetes mellitus without complications: Secondary | ICD-10-CM | POA: Insufficient documentation

## 2013-11-25 DIAGNOSIS — K5900 Constipation, unspecified: Secondary | ICD-10-CM | POA: Insufficient documentation

## 2013-11-25 DIAGNOSIS — Z79899 Other long term (current) drug therapy: Secondary | ICD-10-CM | POA: Insufficient documentation

## 2013-11-25 DIAGNOSIS — F17200 Nicotine dependence, unspecified, uncomplicated: Secondary | ICD-10-CM | POA: Insufficient documentation

## 2013-11-25 DIAGNOSIS — Z3202 Encounter for pregnancy test, result negative: Secondary | ICD-10-CM | POA: Insufficient documentation

## 2013-11-25 DIAGNOSIS — Z90710 Acquired absence of both cervix and uterus: Secondary | ICD-10-CM | POA: Insufficient documentation

## 2013-11-25 DIAGNOSIS — Z8659 Personal history of other mental and behavioral disorders: Secondary | ICD-10-CM | POA: Insufficient documentation

## 2013-11-25 DIAGNOSIS — Z789 Other specified health status: Secondary | ICD-10-CM | POA: Insufficient documentation

## 2013-11-25 LAB — CBC WITH DIFFERENTIAL/PLATELET
BASOS ABS: 0 10*3/uL (ref 0.0–0.1)
Basophils Relative: 1 % (ref 0–1)
Eosinophils Absolute: 0.1 10*3/uL (ref 0.0–0.7)
Eosinophils Relative: 3 % (ref 0–5)
HCT: 38.8 % (ref 36.0–46.0)
Hemoglobin: 13 g/dL (ref 12.0–15.0)
LYMPHS ABS: 2.2 10*3/uL (ref 0.7–4.0)
Lymphocytes Relative: 59 % — ABNORMAL HIGH (ref 12–46)
MCH: 29 pg (ref 26.0–34.0)
MCHC: 33.5 g/dL (ref 30.0–36.0)
MCV: 86.6 fL (ref 78.0–100.0)
MONO ABS: 0.5 10*3/uL (ref 0.1–1.0)
Monocytes Relative: 15 % — ABNORMAL HIGH (ref 3–12)
Neutro Abs: 0.8 10*3/uL — ABNORMAL LOW (ref 1.7–7.7)
Neutrophils Relative %: 22 % — ABNORMAL LOW (ref 43–77)
PLATELETS: 266 10*3/uL (ref 150–400)
RBC: 4.48 MIL/uL (ref 3.87–5.11)
RDW: 13.2 % (ref 11.5–15.5)
WBC: 3.6 10*3/uL — ABNORMAL LOW (ref 4.0–10.5)

## 2013-11-25 LAB — URINALYSIS, ROUTINE W REFLEX MICROSCOPIC
Bilirubin Urine: NEGATIVE
GLUCOSE, UA: NEGATIVE mg/dL
Hgb urine dipstick: NEGATIVE
Ketones, ur: NEGATIVE mg/dL
LEUKOCYTES UA: NEGATIVE
NITRITE: NEGATIVE
PROTEIN: NEGATIVE mg/dL
Specific Gravity, Urine: 1.009 (ref 1.005–1.030)
Urobilinogen, UA: 0.2 mg/dL (ref 0.0–1.0)
pH: 6 (ref 5.0–8.0)

## 2013-11-25 LAB — COMPREHENSIVE METABOLIC PANEL
ALBUMIN: 3.8 g/dL (ref 3.5–5.2)
ALT: 20 U/L (ref 0–35)
AST: 16 U/L (ref 0–37)
Alkaline Phosphatase: 69 U/L (ref 39–117)
BUN: 8 mg/dL (ref 6–23)
CALCIUM: 8.9 mg/dL (ref 8.4–10.5)
CO2: 24 meq/L (ref 19–32)
CREATININE: 0.99 mg/dL (ref 0.50–1.10)
Chloride: 103 mEq/L (ref 96–112)
GFR calc Af Amer: 81 mL/min — ABNORMAL LOW (ref >90)
GFR, EST NON AFRICAN AMERICAN: 70 mL/min — AB (ref >90)
Glucose, Bld: 102 mg/dL — ABNORMAL HIGH (ref 70–99)
Potassium: 3.8 mEq/L (ref 3.7–5.3)
Sodium: 140 mEq/L (ref 137–147)
Total Bilirubin: 0.2 mg/dL — ABNORMAL LOW (ref 0.3–1.2)
Total Protein: 7.9 g/dL (ref 6.0–8.3)

## 2013-11-25 LAB — LIPASE, BLOOD: Lipase: 21 U/L (ref 11–59)

## 2013-11-25 LAB — POCT PREGNANCY, URINE: PREG TEST UR: NEGATIVE

## 2013-11-25 LAB — POCT I-STAT TROPONIN I: Troponin i, poc: 0 ng/mL (ref 0.00–0.08)

## 2013-11-25 MED ORDER — POLYETHYLENE GLYCOL 3350 17 G PO PACK: 17.0000 g | PACK | Freq: Every day | ORAL | Status: DC

## 2013-11-25 MED ORDER — OMEPRAZOLE 20 MG PO CPDR: 20.0000 mg | DELAYED_RELEASE_CAPSULE | Freq: Every day | ORAL | Status: DC

## 2013-11-25 MED ORDER — SUCRALFATE 1 G PO TABS: 1.0000 g | ORAL_TABLET | Freq: Three times a day (TID) | ORAL | Status: DC

## 2013-11-25 NOTE — Discharge Instructions (Signed)
Follow up with your PCP in 2 days. Take Miralax for constipation symptoms. Take Carafate and Omeprazole for symptoms of abdominal pain. Return to ED should you develop worsening pain, fever/chills, chest pain, or shortness of breath.

## 2013-11-25 NOTE — ED Notes (Signed)
Pt reporting LUQ pain with radiation to back since yesterday. PCP sent here for evaluation. Denies any sob, n/v. Reports last BM was Monday. Pt is a x4 in NAD

## 2013-11-25 NOTE — ED Provider Notes (Signed)
CSN: [id number]     Arrival date & time 11/25/13  1308 History   First MD Initiated Contact with Patient 11/25/13 1409     Chief Complaint  Patient presents with  . Abdominal Pain     (Consider location/radiation/quality/duration/timing/severity/associated sxs/prior Treatment) HPI 43 yo female presents with constant LUQ pain that is described as "full, pressure" that radiates to the back. Pain started yesterday morning at 6:30 am. Pain rated currently at a 0/10 but 5/10 when she takes a deep breath or sits up. Patient states last BM was this morning and was very little and hard. Patient admits to constipation and excessive belching.  Patient has not taking anything for symptoms. Denies CP, SOB, N/V/D, bloody stools. Patient states that abdominal pain is worse with sitting up, and taking deep breaths. Pain improved with lying flat. Pain not associated with eating.  LMP finished yesterday. Denies any urinary sxs. No vaginal pain or discharge. PMH significant for DM, and HTN. Patient states her PCP just discontinued her Metformin in December, because her "sugars were low, and I was feeling dizzy on the medication". Dizziness resolved since cessation of metformin.  Past Medical History  Diagnosis Date  . Hypertension   . Tubal pregnancy   . Tobacco use disorder   . Diabetes mellitus 2005  . Wears glasses   . Anxiety   . Chronic back pain    Past Surgical History  Procedure Laterality Date  . Total abdominal hysterectomy w/ bilateral salpingoophorectomy      R   No family history on file. History  Substance Use Topics  . Smoking status: Current Every Day Smoker -- 0.50 packs/day  . Smokeless tobacco: Not on file  . Alcohol Use: No   OB History   Grav Para Term Preterm Abortions TAB SAB Ect Mult Living                 Review of Systems  All other systems reviewed and are negative.    Allergies  Percocet  Home Medications   Current Outpatient Rx  Name  Route  Sig  Dispense   Refill  . lisinopril (PRINIVIL,ZESTRIL) 20 MG tablet   Oral   Take 1 tablet (20 mg total) by mouth daily.   90 tablet   3   . OVER THE COUNTER MEDICATION   Oral   Take 1 tablet by mouth every 8 (eight) hours as needed (for cold/ allergies). Coricidin tablet         . omeprazole (PRILOSEC) 20 MG capsule   Oral   Take 1 capsule (20 mg total) by mouth daily.   30 capsule   0   . polyethylene glycol (MIRALAX / GLYCOLAX) packet   Oral   Take 17 g by mouth daily.   14 each   0   . sucralfate (CARAFATE) 1 G tablet   Oral   Take 1 tablet (1 g total) by mouth 4 (four) times daily -  with meals and at bedtime.   30 tablet   0    BP 124/53  Pulse 60  Temp(Src) 98.4 F (36.9 C) (Oral)  Resp 13  Ht 5\' 5"  (1.651 m)  Wt 216 lb (97.977 kg)  BMI 35.94 kg/m2  SpO2 99%  LMP 11/22/2013 Physical Exam  Nursing note and vitals reviewed. Constitutional: She is oriented to person, place, and time. She appears well-developed and well-nourished. No distress.  HENT:  Head: Normocephalic and atraumatic.  Eyes: Conjunctivae are normal. No scleral icterus.  Neck: No tracheal deviation present.  Cardiovascular: Normal rate and regular rhythm.   Pulmonary/Chest: Effort normal and breath sounds normal. No respiratory distress.  Abdominal: Soft. Bowel sounds are normal. She exhibits no distension. There is no hepatosplenomegaly. There is tenderness in the left upper quadrant. There is no rigidity, no rebound, no guarding, no tenderness at McBurney's point and negative Murphy's sign.  Musculoskeletal: Normal range of motion. She exhibits no edema.  Neurological: She is alert and oriented to person, place, and time.  Skin: Skin is warm and dry. She is not diaphoretic.  Psychiatric: She has a normal mood and affect. Her behavior is normal.    ED Course  Procedures (including critical care time) Labs Review Labs Reviewed  CBC WITH DIFFERENTIAL - Abnormal; Notable for the following:    WBC 3.6  (*)    Neutrophils Relative % 22 (*)    Lymphocytes Relative 59 (*)    Monocytes Relative 15 (*)    Neutro Abs 0.8 (*)    All other components within normal limits  COMPREHENSIVE METABOLIC PANEL - Abnormal; Notable for the following:    Glucose, Bld 102 (*)    Total Bilirubin 0.2 (*)    GFR calc non Af Amer 70 (*)    GFR calc Af Amer 81 (*)    All other components within normal limits  LIPASE, BLOOD  URINALYSIS, ROUTINE W REFLEX MICROSCOPIC  POCT PREGNANCY, URINE  POCT I-STAT TROPONIN I   Imaging Review Dg Chest 2 View  11/25/2013   CLINICAL DATA:  Left side abdominal pain, smoker, diabetes, hypertension  EXAM: CHEST  2 VIEW  COMPARISON:  None  FINDINGS: Normal heart size, mediastinal contours, and pulmonary vascularity.  Lungs clear.  No pleural effusion or pneumothorax.  Bones unremarkable.  No free air under the diaphragms.  IMPRESSION: No acute abnormalities.   Electronically Signed   By: Ulyses Southward M.D.   On: 11/25/2013 15:59    EKG Interpretation    Date/Time:  Wednesday November 25 2013 13:21:49 EST Ventricular Rate:  71 PR Interval:  162 QRS Duration: 86 QT Interval:  354 QTC Calculation: 384 R Axis:   72 Text Interpretation:  Normal sinus rhythm Cannot rule out Anterior infarct , age undetermined Abnormal ECG No significant change since last tracing Confirmed by ALLEN  MD, ANTHONY ([id number]) on 11/25/2013 3:16:43 PM            MDM   Final diagnoses:  Abdominal pain  Constipation   Patient afebrile with normal VS.  Urine preg negative UA  WNL Troponin negative Lipase negative CXR negative   Discussed findings with patient. Plan to treat patient's symptoms and have her follow up with her PCP in 2 days. Patient agrees with plan. Discharged in good condition.   Meds given in ED:  Medications - No data to display  Discharge Medication List as of 11/25/2013  4:21 PM    START taking these medications   Details  omeprazole (PRILOSEC) 20 MG capsule Take 1  capsule (20 mg total) by mouth daily., Starting 11/25/2013, Until Discontinued, Print    polyethylene glycol (MIRALAX / GLYCOLAX) packet Take 17 g by mouth daily., Starting 11/25/2013, Until Discontinued, Print    sucralfate (CARAFATE) 1 G tablet Take 1 tablet (1 g total) by mouth 4 (four) times daily -  with meals and at bedtime., Starting 11/25/2013, Until Discontinued, Print              Rudene Anda, [place] 11/25/13 2305

## 2013-11-25 NOTE — ED Provider Notes (Signed)
Medical screening examination/treatment/procedure(s) were conducted as a shared visit with non-physician practitioner(s) and myself.  I personally evaluated the patient during the encounter.  EKG Interpretation    Date/Time:  Wednesday November 25 2013 13:21:49 EST Ventricular Rate:  71 PR Interval:  162 QRS Duration: 86 QT Interval:  354 QTC Calculation: 384 R Axis:   72 Text Interpretation:  Normal sinus rhythm Cannot rule out Anterior infarct , age undetermined Abnormal ECG No significant change since last tracing Confirmed by Eliyana Pagliaro  MD, Jemar Paulsen ([id number]) on 11/25/2013 3:16:43 PM           Patient here with left upper quadrant pain radiating to her back since yesterday. Pain is worse with sitting positions. No other associated symptoms. Patient's EKG without signs of acute infarction. Laboratory studies showed no signs of pancreatic involvement. Will check chest x-ray. Suspect possible hiatal hernia. Will refer back to her physician  Toy BakerAnthony T Charbel Los, MD 11/25/13 1531

## 2013-11-26 LAB — PATHOLOGIST SMEAR REVIEW: Path Review: REACTIVE

## 2013-11-27 NOTE — ED Provider Notes (Signed)
Medical screening examination/treatment/procedure(s) were conducted as a shared visit with non-physician practitioner(s) and myself.  I personally evaluated the patient during the encounter.  EKG Interpretation    Date/Time:  Wednesday November 25 2013 13:21:49 EST Ventricular Rate:  71 PR Interval:  162 QRS Duration: 86 QT Interval:  354 QTC Calculation: 384 R Axis:   72 Text Interpretation:  Normal sinus rhythm Cannot rule out Anterior infarct , age undetermined Abnormal ECG No significant change since last tracing Confirmed by Freida BusmanALLEN  MD, Deondrea Markos [phone]([id number]) on 11/25/2013 3:16:43 PM             Toy BakerAnthony T Nina Hoar, MD 11/27/13 2013

## 2014-01-24 ENCOUNTER — Emergency Department (HOSPITAL_COMMUNITY)
Admission: EM | Admit: 2014-01-24 | Discharge: 2014-01-24 | Disposition: A | Discharge status: Home / Self Care | Payer: Commercial Managed Care - PPO | Attending: Emergency Medicine | Admitting: Emergency Medicine

## 2014-01-24 ENCOUNTER — Encounter (HOSPITAL_COMMUNITY): Payer: Self-pay | Admitting: Emergency Medicine

## 2014-01-24 DIAGNOSIS — Z79899 Other long term (current) drug therapy: Secondary | ICD-10-CM | POA: Insufficient documentation

## 2014-01-24 DIAGNOSIS — G8929 Other chronic pain: Secondary | ICD-10-CM | POA: Insufficient documentation

## 2014-01-24 DIAGNOSIS — N750 Cyst of Bartholin's gland: Secondary | ICD-10-CM | POA: Insufficient documentation

## 2014-01-24 DIAGNOSIS — Z90710 Acquired absence of both cervix and uterus: Secondary | ICD-10-CM | POA: Insufficient documentation

## 2014-01-24 DIAGNOSIS — Z23 Encounter for immunization: Secondary | ICD-10-CM | POA: Insufficient documentation

## 2014-01-24 DIAGNOSIS — Z8659 Personal history of other mental and behavioral disorders: Secondary | ICD-10-CM | POA: Insufficient documentation

## 2014-01-24 DIAGNOSIS — E119 Type 2 diabetes mellitus without complications: Secondary | ICD-10-CM | POA: Insufficient documentation

## 2014-01-24 DIAGNOSIS — I10 Essential (primary) hypertension: Secondary | ICD-10-CM | POA: Insufficient documentation

## 2014-01-24 DIAGNOSIS — F17200 Nicotine dependence, unspecified, uncomplicated: Secondary | ICD-10-CM | POA: Insufficient documentation

## 2014-01-24 MED ORDER — SULFAMETHOXAZOLE-TRIMETHOPRIM 800-160 MG PO TABS: 1.0000 | ORAL_TABLET | Freq: Two times a day (BID) | ORAL | Status: AC

## 2014-01-24 MED ORDER — TETANUS-DIPHTH-ACELL PERTUSSIS 5-2.5-18.5 LF-MCG/0.5 IM SUSP
0.5000 mL | Freq: Once | INTRAMUSCULAR | Status: AC
  Administered 2014-01-24: 0.5 mL via INTRAMUSCULAR
  Filled 2014-01-24: qty 0.5

## 2014-01-24 MED ORDER — CEPHALEXIN 500 MG PO CAPS: 500.0000 mg | ORAL_CAPSULE | Freq: Four times a day (QID) | ORAL | Status: DC

## 2014-01-24 MED ORDER — NAPROXEN 500 MG PO TABS: 500.0000 mg | ORAL_TABLET | Freq: Two times a day (BID) | ORAL | Status: DC

## 2014-01-24 NOTE — ED Provider Notes (Signed)
CSN: [id number]     Arrival date & time 01/24/14  0458 History   First MD Initiated Contact with Patient 01/24/14 0602     Chief Complaint  Patient presents with  . Abscess     (Consider location/radiation/quality/duration/timing/severity/associated sxs/prior Treatment) HPI  43 year old female with history of non-insulin-dependent diabetes, hypertension, chronic back pain presents for evaluations of abscess. Patient reports gradual onset of pain and swelling noted to the right labial region for the past 2 days. Pain is worsened to palpation. Nonradiating and felt similar to prior abscess. She has tried to use Boil-EZ and aspirin with minimal relief. She denies fever, chills, vaginal discharge, or rash. She has never had any I&D procedure in the past. She is not up-to-date with her tetanus. She has no other complaint.  Past Medical History  Diagnosis Date  . Hypertension   . Tubal pregnancy   . Tobacco use disorder   . Diabetes mellitus 2005  . Wears glasses   . Anxiety   . Chronic back pain    Past Surgical History  Procedure Laterality Date  . Total abdominal hysterectomy w/ bilateral salpingoophorectomy      R   No family history on file. History  Substance Use Topics  . Smoking status: Current Every Day Smoker -- 0.50 packs/day  . Smokeless tobacco: Not on file  . Alcohol Use: No   OB History   Grav Para Term Preterm Abortions TAB SAB Ect Mult Living                 Review of Systems  Constitutional: Negative for fever.  Genitourinary: Negative for vaginal bleeding and vaginal discharge.  Skin: Negative for rash.      Allergies  Percocet  Home Medications   Current Outpatient Rx  Name  Route  Sig  Dispense  Refill  . ibuprofen (ADVIL,MOTRIN) 200 MG tablet   Oral   Take 200 mg by mouth every 6 (six) hours as needed for mild pain.         Marland Kitchen. lisinopril (PRINIVIL,ZESTRIL) 20 MG tablet   Oral   Take 1 tablet (20 mg total) by mouth daily.   90 tablet    3    BP 115/49  Pulse 68  Temp(Src) 97.6 F (36.4 C) (Oral)  Resp 16  Ht 5\' 5"  (1.651 m)  Wt 215 lb (97.523 kg)  BMI 35.78 kg/m2  SpO2 98%  LMP 01/24/2014 Physical Exam  Nursing note and vitals reviewed. Constitutional: She appears well-developed and well-nourished. No distress.  HENT:  Head: Atraumatic.  Eyes: Conjunctivae are normal.  Neck: Neck supple.  Genitourinary:  Chaperone present:  Abscess noted to R labia majora consistent with bartholin cyst infection.  Ttp.  No rash or exudates.    Neurological: She is alert.  Skin: No rash noted.  Psychiatric: She has a normal mood and affect.    ED Course  Procedures (including critical care time)  7:21 AM Patient has an infected right Bartholin's cyst abscess does assess for I&D by me. A word catheter was placed successfully and 3cc of sterile water was injected into the catheter, successfully inflated the balloon.  Pt tolerates well. I recommend patient to followup for cath removal however patient prefers not to have the catheter because she doesn't think she can follow up at the appropriate time.  I then removed the word catheter and instruct pt to use warm compress and sitz bath regularly.  Will d/c with abx and pain meds.  abx  was chosen due to hx of diabetes and pt also report having other isolated abscesses near labia that was not amenable for drainage at this time.  tdap given.  Pt aware the risk of abscess reaccumulation without word catheter.  Return precaution discussed.    INCISION AND DRAINAGE Performed by: Fayrene Helper Consent: Verbal consent obtained. Risks and benefits: risks, benefits and alternatives were discussed Type: abscess  Body area: Bartholin Cyst, R labial region  Anesthesia: local infiltration  Incision was made with a scalpel.  Local anesthetic: lidocaine 2% w/out epinephrine  Anesthetic total: 3 ml  Complexity: complex Blunt dissection to break up loculations  Drainage:  purulent  Drainage amount: mild  Packing material: word catheter  Patient tolerance: Patient tolerated the procedure well with no immediate complications.     Labs Review Labs Reviewed - No data to display Imaging Review No results found.   EKG Interpretation None      MDM   Final diagnoses:  Infected cyst of Bartholin's gland duct    BP 119/87  Pulse 76  Temp(Src) 98.3 F (36.8 C) (Oral)  Resp 16  Ht 5\' 5"  (1.651 m)  Wt 215 lb (97.523 kg)  BMI 35.78 kg/m2  SpO2 100%  LMP 01/24/2014     Fayrene Helper, PA-C 01/24/14 0727  Fayrene Helper, PA-C 01/24/14 [phone]

## 2014-01-24 NOTE — ED Notes (Signed)
States,"abscess on the rt. Side of vagina."

## 2014-01-24 NOTE — Discharge Instructions (Signed)
Bartholin's Cyst or Abscess Bartholin's glands are small glands located within the folds of skin (labia) along the sides of the lower opening of the vagina (birth canal). A cyst may develop when the duct of the gland becomes blocked. When this happens, fluid that accumulates within the cyst can become infected. This is known as an abscess. The Bartholin gland produces a mucous fluid to lubricate the outside of the vagina during sexual intercourse. SYMPTOMS   Patients with a small cyst may not have any symptoms.  Mild discomfort to severe pain depending on the size of the cyst and if it is infected (abscess).  Pain, redness, and swelling around the lower opening of the vagina.  Painful intercourse.  Pressure in the perineal area.  Swelling of the lips of the vagina (labia).  The cyst or abscess can be on one side or both sides of the vagina. DIAGNOSIS   A large swelling is seen in the lower vagina area by your caregiver.  Painful to touch.  Redness and pain, if it is an abscess. TREATMENT   Sometimes the cyst will go away on its own.  Apply warm wet compresses to the area or take hot sitz baths several times a day.  An incision to drain the cyst or abscess with local anesthesia.  Culture the pus, if it is an abscess.  Antibiotic treatment, if it is an abscess.  Cut open the gland and suture the edges to make the opening of the gland bigger (marsupialization).  Remove the whole gland if the cyst or abscess returns. PREVENTION   Practice good hygiene.  Clean the vaginal area with a mild soap and soft cloth when bathing.  Do not rub hard in the vaginal area when bathing.  Protect the crotch area with a padded cushion if you take long bike rides or ride horses.  Be sure you are well lubricated when you have sexual intercourse. HOME CARE INSTRUCTIONS   If your cyst or abscess was opened, a small piece of gauze, or a drain, may have been placed in the wound to allow  drainage. Do not remove this gauze or drain unless directed by your caregiver.  Wear feminine pads, not tampons, as needed for any drainage or bleeding.  If antibiotics were prescribed, take them exactly as directed. Finish the entire course.  Only take over-the-counter or prescription medicines for pain, discomfort, or fever as directed by your caregiver. SEEK IMMEDIATE MEDICAL CARE IF:   You have an increase in pain, redness, swelling, or drainage.  You have bleeding from the wound which results in the use of more than the number of pads suggested by your caregiver in 24 hours.  You have chills.  You have a fever.  You develop any new problems (symptoms) or aggravation of your existing condition. MAKE SURE YOU:   Understand these instructions.  Will watch your condition.  Will get help right away if you are not doing well or get worse. Document Released: 10/01/2005 Document Revised: 12/24/2011 Document Reviewed: 05/19/2008 ExitCare Patient Information 2014 [employer].  

## 2014-01-24 NOTE — ED Provider Notes (Signed)
Medical screening examination/treatment/procedure(s) were performed by non-physician practitioner and as supervising physician I was immediately available for consultation/collaboration.    Brandt LoosenJulie Osby Sweetin, MD 01/24/14 2329

## 2014-02-12 DIAGNOSIS — Z87891 Personal history of nicotine dependence: Secondary | ICD-10-CM

## 2014-02-12 HISTORY — DX: Personal history of nicotine dependence: Z87.891

## 2014-02-18 ENCOUNTER — Ambulatory Visit (INDEPENDENT_AMBULATORY_CARE_PROVIDER_SITE_OTHER): Payer: Commercial Managed Care - PPO | Admitting: Medical

## 2014-02-18 ENCOUNTER — Encounter: Payer: Self-pay | Admitting: Medical

## 2014-02-18 VITALS — BP 126/60 | HR 68 | Temp 98.0°F | Resp 16 | Wt 215.0 lb

## 2014-02-18 DIAGNOSIS — R0600 Dyspnea, unspecified: Secondary | ICD-10-CM

## 2014-02-18 DIAGNOSIS — R0683 Snoring: Secondary | ICD-10-CM

## 2014-02-18 DIAGNOSIS — R400 Somnolence: Secondary | ICD-10-CM

## 2014-02-18 DIAGNOSIS — R0989 Other specified symptoms and signs involving the circulatory and respiratory systems: Secondary | ICD-10-CM

## 2014-02-18 DIAGNOSIS — Z87891 Personal history of nicotine dependence: Secondary | ICD-10-CM

## 2014-02-18 DIAGNOSIS — I10 Essential (primary) hypertension: Secondary | ICD-10-CM

## 2014-02-18 DIAGNOSIS — G4710 Hypersomnia, unspecified: Secondary | ICD-10-CM

## 2014-02-18 DIAGNOSIS — R0609 Other forms of dyspnea: Secondary | ICD-10-CM

## 2014-02-18 DIAGNOSIS — E119 Type 2 diabetes mellitus without complications: Secondary | ICD-10-CM

## 2014-02-18 LAB — CBC WITH DIFFERENTIAL/PLATELET
Basophils Absolute: 0 10*3/uL (ref 0.0–0.1)
Basophils Relative: 1 % (ref 0–1)
Eosinophils Absolute: 0.2 10*3/uL (ref 0.0–0.7)
Eosinophils Relative: 4 % (ref 0–5)
HEMATOCRIT: 35.3 % — AB (ref 36.0–46.0)
Hemoglobin: 11.7 g/dL — ABNORMAL LOW (ref 12.0–15.0)
LYMPHS PCT: 48 % — AB (ref 12–46)
Lymphs Abs: 2.1 10*3/uL (ref 0.7–4.0)
MCH: 28.1 pg (ref 26.0–34.0)
MCHC: 33.1 g/dL (ref 30.0–36.0)
MCV: 84.9 fL (ref 78.0–100.0)
MONO ABS: 0.3 10*3/uL (ref 0.1–1.0)
Monocytes Relative: 8 % (ref 3–12)
NEUTROS ABS: 1.7 10*3/uL (ref 1.7–7.7)
Neutrophils Relative %: 39 % — ABNORMAL LOW (ref 43–77)
Platelets: 314 10*3/uL (ref 150–400)
RBC: 4.16 MIL/uL (ref 3.87–5.11)
RDW: 13.8 % (ref 11.5–15.5)
WBC: 4.3 10*3/uL (ref 4.0–10.5)

## 2014-02-18 NOTE — Progress Notes (Signed)
   Subjective:   Kelsey Olson is a 43 y.o. female presenting on 02/18/2014 with Diabetes and Hypertension  Here for routine f/u.  Quit tobacco Sunday, and since then BP as been staying low to the point of feeling bad.  She thinks her BP medication maybe too strong now.  Didn't take her BP medication yesterday.  Checking BPs, getting 116/64 this morning, 110/64 yesterday.  Yesterday after taking her medication, felt dizzy and BP was 90/54.  Drinking same amount of water as usual.   Exercise walking 2-3 days per week.   Diet is ok, trying hard to eat the right foods.    She does report at times awakes gasping for air after she goes to sleep. Feels day time somnolence at times, but usually awakes feeling rested.  No other aggravating or relieving factors.  No other complaint.  Review of Systems ROS as in subjective      Objective:     Filed Vitals:   02/18/14 1115  BP: 126/60  Pulse: 68  Temp: 98 F (36.7 C)  Resp: 16    General appearance: alert, no distress, WD/WN, AA female HEENT: normocephalic, sclerae anicteric, TMs pearly, nares patent, no discharge or erythema, pharynx normal Oral cavity: MMM, no lesions Neck: supple, no lymphadenopathy, no thyromegaly, no masses Heart: RRR, normal S1, S2, no murmurs Lungs: CTA bilaterally, no wheezes, rhonchi, or rales Abdomen: +bs, soft, non tender, non distended, no masses, no hepatomegaly, no splenomegaly Pulses: 2+ symmetric, upper and lower extremities, normal cap refill Ext: no edema     Assessment: Encounter Diagnoses  Name Primary?  . Type II or unspecified type diabetes mellitus without mention of complication, not stated as uncontrolled Yes  . Essential hypertension, benign   . Former smoker   . PND (paroxysmal nocturnal dyspnea)   . Daytime somnolence      Plan: So glad to hear she stopped tobacco. She has a support network of friends helping her remain quit.  routine labs today, but she is nonfasting.,  DM  type II - labs today, c/t exercise, healthy diet  HTN - cut Lisinopril 20mg  in half to 10mg  daily, check BP daily, get me numbers in 2 wk  Daytime somnolence, PND, snoring, possibly sleep apnea.  discussed diagnose, risk factors, possible treatments. Epworth sleep scale 2.  Consider sleep study, she will work on weight loss efforts.  Kelsey Olson was seen today for diabetes and hypertension.  Diagnoses and associated orders for this visit:  Type II or unspecified type diabetes mellitus without mention of complication, not stated as uncontrolled - Comprehensive metabolic panel - Hemoglobin A1c - CBC with Differential - Microalbumin / creatinine urine ratio - Urinalysis Dipstick  Essential hypertension, benign - Comprehensive metabolic panel - Hemoglobin A1c - CBC with Differential - Microalbumin / creatinine urine ratio - Urinalysis Dipstick  Former smoker - Comprehensive metabolic panel - Hemoglobin A1c - CBC with Differential - Microalbumin / creatinine urine ratio - Urinalysis Dipstick  PND (paroxysmal nocturnal dyspnea) - Comprehensive metabolic panel - Hemoglobin A1c - CBC with Differential - Microalbumin / creatinine urine ratio - Urinalysis Dipstick  Daytime somnolence - Comprehensive metabolic panel - Hemoglobin A1c - CBC with Differential - Microalbumin / creatinine urine ratio - Urinalysis Dipstick     Return pending labs.

## 2014-02-19 LAB — COMPREHENSIVE METABOLIC PANEL
ALBUMIN: 3.7 g/dL (ref 3.5–5.2)
ALK PHOS: 63 U/L (ref 39–117)
ALT: 11 U/L (ref 0–35)
AST: 11 U/L (ref 0–37)
BILIRUBIN TOTAL: 0.3 mg/dL (ref 0.2–1.2)
BUN: 13 mg/dL (ref 6–23)
CO2: 29 mEq/L (ref 19–32)
Calcium: 8.8 mg/dL (ref 8.4–10.5)
Chloride: 102 mEq/L (ref 96–112)
Creat: 1.04 mg/dL (ref 0.50–1.10)
GLUCOSE: 109 mg/dL — AB (ref 70–99)
POTASSIUM: 4.5 meq/L (ref 3.5–5.3)
Sodium: 138 mEq/L (ref 135–145)
Total Protein: 6.4 g/dL (ref 6.0–8.3)

## 2014-02-19 LAB — HEMOGLOBIN A1C
Hgb A1c MFr Bld: 7 % — ABNORMAL HIGH (ref <5.7)
MEAN PLASMA GLUCOSE: 154 mg/dL — AB (ref <117)

## 2014-02-19 LAB — MICROALBUMIN / CREATININE URINE RATIO
Creatinine, Urine: 194.5 mg/dL
MICROALB/CREAT RATIO: 2.6 mg/g (ref 0.0–30.0)
Microalb, Ur: 0.5 mg/dL (ref 0.00–1.89)

## 2014-03-11 ENCOUNTER — Other Ambulatory Visit: Payer: Self-pay | Admitting: Medical

## 2014-04-23 ENCOUNTER — Encounter: Payer: Self-pay | Admitting: Medical

## 2014-07-21 ENCOUNTER — Other Ambulatory Visit: Payer: Self-pay | Admitting: Medical

## 2014-08-24 ENCOUNTER — Other Ambulatory Visit: Payer: Self-pay | Admitting: Medical

## 2014-08-24 NOTE — Telephone Encounter (Signed)
PLEASE CALL THE OFFICE AND SCHEDULE A DIABETIC MEDICATION CHECK YOU ARE DUE

## 2014-09-27 ENCOUNTER — Telehealth: Payer: Self-pay | Admitting: Family Medicine

## 2014-09-27 ENCOUNTER — Other Ambulatory Visit: Payer: Self-pay | Admitting: Family Medicine

## 2014-09-27 MED ORDER — METFORMIN HCL 500 MG PO TABS: 500.0000 mg | ORAL_TABLET | Freq: Every day | ORAL | Status: DC

## 2014-09-27 NOTE — Telephone Encounter (Signed)
Pt called to schedule diabetic follow up for Friday.  Refilled Metformin to [facility] on [address] as pt is out of meds.

## 2014-10-01 ENCOUNTER — Telehealth: Payer: Self-pay | Admitting: Medical

## 2014-10-01 ENCOUNTER — Ambulatory Visit (INDEPENDENT_AMBULATORY_CARE_PROVIDER_SITE_OTHER): Payer: Commercial Managed Care - PPO | Admitting: Medical

## 2014-10-01 ENCOUNTER — Encounter: Payer: Self-pay | Admitting: Medical

## 2014-10-01 VITALS — BP 120/78 | HR 74 | Temp 97.2°F | Wt 226.0 lb

## 2014-10-01 DIAGNOSIS — M62838 Other muscle spasm: Secondary | ICD-10-CM

## 2014-10-01 DIAGNOSIS — R140 Abdominal distension (gaseous): Secondary | ICD-10-CM

## 2014-10-01 DIAGNOSIS — IMO0002 Reserved for concepts with insufficient information to code with codable children: Secondary | ICD-10-CM

## 2014-10-01 DIAGNOSIS — M6248 Contracture of muscle, other site: Secondary | ICD-10-CM

## 2014-10-01 DIAGNOSIS — E669 Obesity, unspecified: Secondary | ICD-10-CM | POA: Insufficient documentation

## 2014-10-01 DIAGNOSIS — G478 Other sleep disorders: Secondary | ICD-10-CM

## 2014-10-01 DIAGNOSIS — I10 Essential (primary) hypertension: Secondary | ICD-10-CM

## 2014-10-01 DIAGNOSIS — M549 Dorsalgia, unspecified: Secondary | ICD-10-CM

## 2014-10-01 DIAGNOSIS — R0683 Snoring: Secondary | ICD-10-CM

## 2014-10-01 DIAGNOSIS — R143 Flatulence: Secondary | ICD-10-CM

## 2014-10-01 DIAGNOSIS — E1165 Type 2 diabetes mellitus with hyperglycemia: Secondary | ICD-10-CM

## 2014-10-01 DIAGNOSIS — G8929 Other chronic pain: Secondary | ICD-10-CM

## 2014-10-01 LAB — POCT GLYCOSYLATED HEMOGLOBIN (HGB A1C): Hemoglobin A1C: 6.7

## 2014-10-01 LAB — LIPID PANEL
CHOLESTEROL: 185 mg/dL (ref 0–200)
HDL: 44 mg/dL (ref >39)
LDL Cholesterol: 117 mg/dL — ABNORMAL HIGH (ref 0–99)
TRIGLYCERIDES: 119 mg/dL (ref <150)
Total CHOL/HDL Ratio: 4.2 Ratio
VLDL: 24 mg/dL (ref 0–40)

## 2014-10-01 LAB — BASIC METABOLIC PANEL
BUN: 7 mg/dL (ref 6–23)
CALCIUM: 9.2 mg/dL (ref 8.4–10.5)
CO2: 27 mEq/L (ref 19–32)
CREATININE: 0.97 mg/dL (ref 0.50–1.10)
Chloride: 101 mEq/L (ref 96–112)
Glucose, Bld: 111 mg/dL — ABNORMAL HIGH (ref 70–99)
POTASSIUM: 4.4 meq/L (ref 3.5–5.3)
Sodium: 138 mEq/L (ref 135–145)

## 2014-10-01 MED ORDER — METFORMIN HCL 500 MG PO TABS: 500.0000 mg | ORAL_TABLET | Freq: Every day | ORAL | Status: DC

## 2014-10-01 MED ORDER — TRAMADOL HCL 50 MG PO TABS: 50.0000 mg | ORAL_TABLET | Freq: Three times a day (TID) | ORAL | Status: DC | PRN

## 2014-10-01 MED ORDER — LISINOPRIL 20 MG PO TABS: 20.0000 mg | ORAL_TABLET | Freq: Every day | ORAL | Status: DC

## 2014-10-01 MED ORDER — POLYETHYLENE GLYCOL 3350 17 GM/SCOOP PO POWD: 17.0000 g | Freq: Two times a day (BID) | ORAL | Status: DC | PRN

## 2014-10-01 MED ORDER — CYCLOBENZAPRINE HCL 10 MG PO TABS: ORAL_TABLET | ORAL | Status: DC

## 2014-10-01 NOTE — Progress Notes (Signed)
  Subjective:   Kelsey Olson is an 43 y.o. female who presents for follow up of chronic issues and med check.  Last visit was 02/2014 for same.   Quit tobacco in May and has been quit since.  Gets urges at times  Hypertension - taking Lisinopril 20mg , 1/2 tablet daily.   Running in the 120s SBP, normal DBPm  Diabetes type 2 - taking Metformin 500mg  once daily. Patient is checking home blood sugars.   Home blood sugar records:120 to 145 and sometimes up to 180 Patient is checking their feet daily. Foot concerns (callous, ulcer, wound, thickened nails, toenail fungus, skin fungus, hammer toe): no concerns Last dilated eye exam 2014, due at this time.   Medication compliance: good  Current diet: well balanced, has better appetite now that she can taste her food since quitting tobacco. Current exercise: none, hurts all the time. Known diabetic complications: her legs and knees hurt all the time   She notes that she hurts all over, knees, back, muscles all over.    Wants sleep study from what we discussed last visit.  The following portions of the patient's history were reviewed and updated as appropriate: allergies, current medications, past family history, past medical history, past social history, past surgical history and problem list.  ROS as in subjective above    Objective:   BP 120/78 mmHg  Pulse 74  Temp(Src) 97.2 F (36.2 C) (Oral)  Wt 226 lb (102.513 kg)  Wt Readings from Last 3 Encounters:  10/01/14 226 lb (102.513 kg)  02/18/14 215 lb (97.523 kg)  01/24/14 215 lb (97.523 kg)   BP Readings from Last 3 Encounters:  10/01/14 120/78  02/18/14 126/60  01/24/14 119/87   Filed Vitals:   10/01/14 0953  BP: 120/78  Pulse: 74  Temp: 97.2 F (36.2 C)    General appearance: alert, no distress, WD/WN Neck: supple, no lymphadenopathy, no thyromegaly, no masses Heart: RRR, normal S1, S2, no murmurs Lungs: CTA bilaterally, no wheezes, rhonchi, or rales Abdomen: +bs,  soft, non tender, non distended, no masses, no hepatomegaly, no splenomegaly Pulses: 2+ symmetric, upper and lower extremities, normal cap refill Ext: no edema Back: Nontender, normal range of motion no scoliosis Legs: Nontender, normal range of motion, no deformity   Assessment:   Encounter Diagnoses  Name Primary?  . Essential hypertension Yes  . Diabetes type 2, uncontrolled   . Obesity   . Chronic back pain   . Neck muscle spasm   . Snoring   . Non-restorative sleep   . Bloating   . Flatulence     Plan:   HTN - c/t same medication DM type 2- see eye doctor, HgbA1C 6.7% today, eat healthy diet, work on lifestyle changes, c/t Metformin 500mg  once daily Obesity-work on lifestyle changes and weight loss as discussed Chronic back pain-can use Ultram when necessary pain, work on routine daily exercise and stretching Neck muscle spasm-Flexeril when necessary Snoring, non restorative sleep-referral for sleep study Labs today Begin Miralax for bloating, flatulence, mild constipation

## 2014-10-01 NOTE — Telephone Encounter (Signed)
Advised patient that we sent order for sleep study, if she hasn't heard from them by this time next week she is to call us back so we can look into.

## 2014-10-01 NOTE — Telephone Encounter (Signed)
Needs sleep study, see last 2 office notes

## 2014-10-01 NOTE — Telephone Encounter (Signed)
Sleep study referral.

## 2014-10-01 NOTE — Patient Instructions (Signed)
Recommendations:  We will set you up for a sleep study  Begin Ultram or OTC Tylenol as needed for pain  I would like you to try and get exercise daily!  You may use Flexeril muscle relaxer 1/2-1 tablet at bedtime for spasm of neck  Drink 64 ounces of water daily  Eat a healthy low fat diet  Continue your current medications as usual

## 2014-10-02 ENCOUNTER — Other Ambulatory Visit: Payer: Self-pay | Admitting: Medical

## 2014-10-02 MED ORDER — METFORMIN HCL 500 MG PO TABS: 500.0000 mg | ORAL_TABLET | Freq: Every day | ORAL | Status: DC

## 2014-10-02 MED ORDER — ATORVASTATIN CALCIUM 20 MG PO TABS: 20.0000 mg | ORAL_TABLET | Freq: Every day | ORAL | Status: DC

## 2014-10-02 MED ORDER — LISINOPRIL 20 MG PO TABS: 20.0000 mg | ORAL_TABLET | Freq: Every day | ORAL | Status: DC

## 2014-10-04 ENCOUNTER — Other Ambulatory Visit: Payer: Self-pay | Admitting: Family Medicine

## 2014-10-04 MED ORDER — LISINOPRIL 20 MG PO TABS: 20.0000 mg | ORAL_TABLET | Freq: Every day | ORAL | Status: DC

## 2014-10-23 ENCOUNTER — Emergency Department (HOSPITAL_COMMUNITY)
Admission: EM | Admit: 2014-10-23 | Discharge: 2014-10-24 | Disposition: A | Discharge status: Home / Self Care | Payer: Commercial Managed Care - PPO | Attending: Emergency Medicine | Admitting: Emergency Medicine

## 2014-10-23 ENCOUNTER — Encounter (HOSPITAL_COMMUNITY): Payer: Self-pay | Admitting: *Deleted

## 2014-10-23 DIAGNOSIS — E119 Type 2 diabetes mellitus without complications: Secondary | ICD-10-CM | POA: Diagnosis not present

## 2014-10-23 DIAGNOSIS — Z3202 Encounter for pregnancy test, result negative: Secondary | ICD-10-CM | POA: Insufficient documentation

## 2014-10-23 DIAGNOSIS — K5900 Constipation, unspecified: Secondary | ICD-10-CM | POA: Diagnosis not present

## 2014-10-23 DIAGNOSIS — R6883 Chills (without fever): Secondary | ICD-10-CM | POA: Diagnosis present

## 2014-10-23 DIAGNOSIS — Z973 Presence of spectacles and contact lenses: Secondary | ICD-10-CM | POA: Diagnosis not present

## 2014-10-23 DIAGNOSIS — F419 Anxiety disorder, unspecified: Secondary | ICD-10-CM | POA: Insufficient documentation

## 2014-10-23 DIAGNOSIS — Z79899 Other long term (current) drug therapy: Secondary | ICD-10-CM | POA: Insufficient documentation

## 2014-10-23 DIAGNOSIS — K920 Hematemesis: Secondary | ICD-10-CM | POA: Diagnosis not present

## 2014-10-23 DIAGNOSIS — I10 Essential (primary) hypertension: Secondary | ICD-10-CM | POA: Diagnosis not present

## 2014-10-23 DIAGNOSIS — G8929 Other chronic pain: Secondary | ICD-10-CM | POA: Diagnosis not present

## 2014-10-23 DIAGNOSIS — Z87891 Personal history of nicotine dependence: Secondary | ICD-10-CM | POA: Insufficient documentation

## 2014-10-24 ENCOUNTER — Emergency Department (HOSPITAL_COMMUNITY): Payer: Commercial Managed Care - PPO

## 2014-10-24 LAB — COMPREHENSIVE METABOLIC PANEL
ALT: 15 U/L (ref 0–35)
ANION GAP: 5 (ref 5–15)
AST: 15 U/L (ref 0–37)
Albumin: 3.7 g/dL (ref 3.5–5.2)
Alkaline Phosphatase: 83 U/L (ref 39–117)
BILIRUBIN TOTAL: 0.3 mg/dL (ref 0.3–1.2)
BUN: 16 mg/dL (ref 6–23)
CALCIUM: 8.6 mg/dL (ref 8.4–10.5)
CHLORIDE: 102 meq/L (ref 96–112)
CO2: 28 mmol/L (ref 19–32)
Creatinine, Ser: 1.03 mg/dL (ref 0.50–1.10)
GFR calc Af Amer: 77 mL/min — ABNORMAL LOW (ref >90)
GFR calc non Af Amer: 66 mL/min — ABNORMAL LOW (ref >90)
GLUCOSE: 159 mg/dL — AB (ref 70–99)
Potassium: 3.5 mmol/L (ref 3.5–5.1)
SODIUM: 135 mmol/L (ref 135–145)
TOTAL PROTEIN: 6.7 g/dL (ref 6.0–8.3)

## 2014-10-24 LAB — CBC WITH DIFFERENTIAL/PLATELET
Basophils Absolute: 0 10*3/uL (ref 0.0–0.1)
Basophils Relative: 1 % (ref 0–1)
Eosinophils Absolute: 0.2 10*3/uL (ref 0.0–0.7)
Eosinophils Relative: 3 % (ref 0–5)
HEMATOCRIT: 35.7 % — AB (ref 36.0–46.0)
Hemoglobin: 11.8 g/dL — ABNORMAL LOW (ref 12.0–15.0)
Lymphocytes Relative: 27 % (ref 12–46)
Lymphs Abs: 1.8 10*3/uL (ref 0.7–4.0)
MCH: 28.6 pg (ref 26.0–34.0)
MCHC: 33.1 g/dL (ref 30.0–36.0)
MCV: 86.7 fL (ref 78.0–100.0)
MONO ABS: 0.7 10*3/uL (ref 0.1–1.0)
Monocytes Relative: 10 % (ref 3–12)
Neutro Abs: 3.9 10*3/uL (ref 1.7–7.7)
Neutrophils Relative %: 60 % (ref 43–77)
Platelets: 298 10*3/uL (ref 150–400)
RBC: 4.12 MIL/uL (ref 3.87–5.11)
RDW: 12.8 % (ref 11.5–15.5)
WBC: 6.5 10*3/uL (ref 4.0–10.5)

## 2014-10-24 LAB — URINALYSIS, ROUTINE W REFLEX MICROSCOPIC
BILIRUBIN URINE: NEGATIVE
Glucose, UA: 100 mg/dL — AB
HGB URINE DIPSTICK: NEGATIVE
Ketones, ur: NEGATIVE mg/dL
Leukocytes, UA: NEGATIVE
NITRITE: NEGATIVE
PH: 5.5 (ref 5.0–8.0)
PROTEIN: 30 mg/dL — AB
Urobilinogen, UA: 0.2 mg/dL (ref 0.0–1.0)

## 2014-10-24 LAB — PREGNANCY, URINE: Preg Test, Ur: NEGATIVE

## 2014-10-24 LAB — URINE MICROSCOPIC-ADD ON

## 2014-10-24 MED ORDER — PANTOPRAZOLE SODIUM 40 MG PO TBEC
40.0000 mg | DELAYED_RELEASE_TABLET | Freq: Once | ORAL | Status: AC
  Administered 2014-10-24: 40 mg via ORAL
  Filled 2014-10-24: qty 1

## 2014-10-24 MED ORDER — PANTOPRAZOLE SODIUM 40 MG PO TBEC: 40.0000 mg | DELAYED_RELEASE_TABLET | Freq: Every day | ORAL | Status: DC

## 2014-10-24 NOTE — Discharge Instructions (Signed)
Hematemesis °This condition is the vomiting of blood. °CAUSES  °This can happen if you have a peptic ulcer or an irritation of the throat, stomach, or small bowel. Vomiting over and over again or swallowing blood from a nosebleed, coughing or facial injury can also result in bloody vomit. Anti-inflammatory pain medicines are a common cause of this potentially dangerous condition. The most serious causes of vomiting blood include: °· Ulcers (a bacteria called H. pylori is common cause of ulcers). °· Clotting problems. °· Alcoholism. °· Cirrhosis. °TREATMENT  °Treatment depends on the cause and the severity of the bleeding. Small amounts of blood streaks in the vomit is not the same as vomiting large amounts of bloody or dark, coffee grounds-like material. Weakness, fainting, dehydration, anemia, and continued alcohol or drug use increase the risk. Examination may include blood, vomit, or stool tests. The presence of bloody or dark stool that tests positive for blood (Hemoccult) means the bleeding has been going on for some time. Endoscopy and imaging studies may be done. Emergency treatment may include: °· IV medicines or fluids. °· Blood transfusions. °· Surgery. °Hospital care is required for high risk patients or when IV fluids or blood is needed. Upper GI bleeding can cause shock and death if not controlled. °HOME CARE INSTRUCTIONS  °· Your treatment does not require hospital care at this time. °· Remain at rest until your condition improves. °· Drink clear liquids as tolerated. °· Avoid: °¨ Alcohol. °¨ Nicotine. °¨ Aspirin. °¨ Any other anti-inflammatory medicine (ibuprofen, naproxen, and many others). °· Medications to suppress stomach acid or vomiting may be needed. Take all your medicine as prescribed. °· Be sure to see your caregiver for follow-up as recommended. °SEEK IMMEDIATE MEDICAL CARE IF:  °· You have repeated vomiting, dehydration, fainting, or extreme weakness. °· You are vomiting large amounts of  bloody or dark material. °· You pass large, dark or bloody stools. °Document Released: 11/08/2004 Document Revised: 12/24/2011 Document Reviewed: 11/24/2008 °ExitCare® Patient Information ©2015 [employer]. This information is not intended to replace advice given to you by your health care provider. Make sure you discuss any questions you have with your health care provider. ° °

## 2014-10-24 NOTE — ED Provider Notes (Signed)
CSN: [id number]     Arrival date & time 10/23/14  2332 History   First MD Initiated Contact with Patient 10/23/14 2339     Chief Complaint  Patient presents with  . Chills    coughed up some blood stinged sputum and diarrhea stools when woke up from a nap     (Consider location/radiation/quality/duration/timing/severity/associated sxs/prior Treatment) The history is provided by the patient.   Kelsey Olson is a 44 y.o. female presenting for evaluation of hematemesis.  She describes sleeping when she woke to have a bowel movement.  She describes having a hard stool which was difficult to pass, which was then followed by looser easier stool. Toward the end of her BM,  She felt weak, became nauseated and had multiple back to back episodes of vomiting,  The last episode consisting of bright red blood mixed with her vomitus.  She is unable to quantify the amount of blood.  She now denies further nausea, abdominal pain or other symptoms although feels chilled since arriving here.  She denies history of constipation.  Her last bm was yesterday.  She denies continued weakness, dizziness or other complaint.     Past Medical History  Diagnosis Date  . Hypertension   . Tubal pregnancy   . Diabetes mellitus 2005  . Wears glasses   . Anxiety   . Chronic back pain   . Former smoker 02/2014   Past Surgical History  Procedure Laterality Date  . Total abdominal hysterectomy w/ bilateral salpingoophorectomy      R   History reviewed. No pertinent family history. History  Substance Use Topics  . Smoking status: Former Smoker -- 0.50 packs/day for 24 years  . Smokeless tobacco: Not on file  . Alcohol Use: No   OB History    No data available     Review of Systems  Constitutional: Negative for fever.  HENT: Negative for congestion and sore throat.   Eyes: Negative.   Respiratory: Negative for chest tightness and shortness of breath.   Cardiovascular: Negative for chest pain.   Gastrointestinal: Positive for nausea, vomiting and constipation. Negative for abdominal pain, blood in stool and rectal pain.  Genitourinary: Negative.   Musculoskeletal: Negative for joint swelling, arthralgias and neck pain.  Skin: Negative.  Negative for rash and wound.  Neurological: Negative for dizziness, weakness, light-headedness, numbness and headaches.  Psychiatric/Behavioral: Negative.       Allergies  Percocet  Home Medications   Prior to Admission medications   Medication Sig Start Date End Date Taking? Authorizing Provider  lisinopril (PRINIVIL,ZESTRIL) 20 MG tablet Take 1 tablet (20 mg total) by mouth daily. 10/04/14  Yes Kermit Balo Tysinger, PA-C  metFORMIN (GLUCOPHAGE) 500 MG tablet Take 1 tablet (500 mg total) by mouth daily with breakfast. 10/02/14  Yes Kermit Balo Tysinger, PA-C  atorvastatin (LIPITOR) 20 MG tablet Take 1 tablet (20 mg total) by mouth daily. 10/02/14   Kermit Balo Tysinger, PA-C  cyclobenzaprine (FLEXERIL) 10 MG tablet 1/2-1 tablet po QHS prn for neck spasm 10/01/14   Kermit Balo Tysinger, PA-C  ibuprofen (ADVIL,MOTRIN) 200 MG tablet Take 200 mg by mouth every 6 (six) hours as needed for mild pain.    Historical Provider, MD  polyethylene glycol powder (GLYCOLAX/MIRALAX) powder Take 17 g by mouth 2 (two) times daily as needed. 10/01/14   Kermit Balo Tysinger, PA-C  traMADol (ULTRAM) 50 MG tablet Take 1 tablet (50 mg total) by mouth every 8 (eight) hours as needed. 10/01/14  Kermit Balo Tysinger, PA-C   BP 121/59 mmHg  Pulse 69  Temp(Src) 98 F (36.7 C) (Oral)  Resp 16  Ht  (1.651 m)  Wt 220 lb (99.791 kg)  BMI 36.61 kg/m2  SpO2 99%  LMP 10/15/2014 Physical Exam  Constitutional: She appears well-developed and well-nourished.  HENT:  Head: Normocephalic and atraumatic.  Eyes: Conjunctivae are normal.  Neck: Normal range of motion.  Cardiovascular: Normal rate, regular rhythm, normal heart sounds and intact distal pulses.   Pulmonary/Chest: Effort normal  and breath sounds normal. No respiratory distress.  Abdominal: Soft. Bowel sounds are normal. She exhibits no distension. There is no tenderness. There is no rebound and no guarding.  Genitourinary: Guaiac negative stool.  Musculoskeletal: Normal range of motion.  Neurological: She is alert.  Skin: Skin is warm and dry.  Psychiatric: She has a normal mood and affect.  Nursing note and vitals reviewed.   ED Course  Procedures (including critical care time) Labs Review Labs Reviewed  URINALYSIS, ROUTINE W REFLEX MICROSCOPIC - Abnormal; Notable for the following:    Specific Gravity, Urine >1.030 (*)    Glucose, UA 100 (*)    Protein, ur 30 (*)    All other components within normal limits  CBC WITH DIFFERENTIAL - Abnormal; Notable for the following:    Hemoglobin 11.8 (*)    HCT 35.7 (*)    All other components within normal limits  COMPREHENSIVE METABOLIC PANEL - Abnormal; Notable for the following:    Glucose, Bld 159 (*)    GFR calc non Af Amer 66 (*)    GFR calc Af Amer 77 (*)    All other components within normal limits  URINE MICROSCOPIC-ADD ON - Abnormal; Notable for the following:    Squamous Epithelial / LPF MANY (*)    Bacteria, UA MANY (*)    All other components within normal limits  PREGNANCY, URINE  POC OCCULT BLOOD, ED    Imaging Review Dg Abd Acute W/chest  10/24/2014   CLINICAL DATA:  Diarrhea and hematemesis since tonight.  EXAM: ACUTE ABDOMEN SERIES (ABDOMEN 2 VIEW & CHEST 1 VIEW)  COMPARISON:  11/25/2013 Chest.  FINDINGS: Normal heart size and pulmonary vascularity. No focal airspace disease or consolidation in the lungs. No blunting of costophrenic angles. No pneumothorax. Mediastinal contours appear intact.  Scattered gas and stool in the colon. No small or large bowel distention. No free intra-abdominal air. No abnormal air-fluid levels. No radiopaque stones. Visualized bones appear intact.  IMPRESSION: No evidence of active pulmonary disease. Nonobstructive  bowel gas pattern.   Electronically Signed   By: Burman Nieves M.D.   On: 10/24/2014 00:40     EKG Interpretation None      MDM   Final diagnoses:  Hematemesis    Pt remains asymptomatic while in ed.  No further nausea, no diarrhea.  No abdominal pain.  Hemoccult negative.  She was given protonix while here,  Will prescribe same as I suspect pt may have gastritis/mallory weiss tear from emesis.  Encouraged bland diet.  F/u with pcp this week if sx return or here if worsens.    The patient appears reasonably screened and/or stabilized for discharge and I doubt any other medical condition or other [facility] requiring further screening, evaluation, or treatment in the ED at this time prior to discharge.  Patients labs and/or radiological studies were viewed and considered during the medical decision making and disposition process.     Burgess Amor, PA-C 10/24/14  [phone]  Geoffery Lyonsouglas Delo, MD 10/24/14 [phone]

## 2014-10-25 LAB — URINE CULTURE
CULTURE: NO GROWTH
Colony Count: NO GROWTH

## 2014-10-26 ENCOUNTER — Other Ambulatory Visit: Payer: Self-pay | Admitting: Family Medicine

## 2014-10-26 ENCOUNTER — Telehealth: Payer: Self-pay | Admitting: Medical

## 2014-10-26 MED ORDER — METFORMIN HCL 500 MG PO TABS: 500.0000 mg | ORAL_TABLET | Freq: Every day | ORAL | Status: DC

## 2014-10-26 NOTE — Telephone Encounter (Signed)
Medication refill was sent to her pharmacy

## 2014-10-26 NOTE — Telephone Encounter (Signed)
Pt called and needs refill on metformin sent to [facility] on [address].

## 2014-10-28 ENCOUNTER — Telehealth: Payer: Self-pay

## 2014-10-28 NOTE — Telephone Encounter (Signed)
Patient called inquiring about status of sleep study?

## 2014-10-29 ENCOUNTER — Other Ambulatory Visit: Payer: Self-pay | Admitting: Family Medicine

## 2014-10-29 DIAGNOSIS — R0683 Snoring: Secondary | ICD-10-CM

## 2014-10-29 DIAGNOSIS — G478 Other sleep disorders: Secondary | ICD-10-CM

## 2014-10-29 NOTE — Telephone Encounter (Signed)
I called the patient and I made her aware that I spoke with the sleep lab and they will call her to set up her appointment

## 2014-10-29 NOTE — Telephone Encounter (Signed)
LMOM TO CB WITH [facility]

## 2014-10-29 NOTE — Telephone Encounter (Signed)
The orders in the system was wrong so i re did the orders and the lady [facility] at [facility] said that she would get in touch with the patient.

## 2014-11-02 ENCOUNTER — Telehealth: Payer: Self-pay | Admitting: Medical

## 2014-11-02 NOTE — Telephone Encounter (Signed)
Please call re: sleep study  She still has not heard anything

## 2014-11-03 NOTE — Telephone Encounter (Signed)
I called over to [facility] to check on calls for appointments. The Rep said that the patient is on the call list to be called today 11/04/14. I called the patient to make her aware that she will be called today so that she can make her appointment for her sleep study.

## 2015-01-06 ENCOUNTER — Encounter (HOSPITAL_BASED_OUTPATIENT_CLINIC_OR_DEPARTMENT_OTHER): Payer: Commercial Managed Care - PPO

## 2015-03-23 ENCOUNTER — Other Ambulatory Visit: Payer: Self-pay | Admitting: Medical

## 2015-03-28 ENCOUNTER — Encounter: Payer: Self-pay | Admitting: Medical

## 2015-03-28 ENCOUNTER — Ambulatory Visit (INDEPENDENT_AMBULATORY_CARE_PROVIDER_SITE_OTHER): Payer: Self-pay | Admitting: Medical

## 2015-03-28 ENCOUNTER — Ambulatory Visit: Payer: Commercial Managed Care - PPO | Admitting: Medical

## 2015-03-28 VITALS — BP 112/78 | HR 102 | Temp 99.1°F | Wt 233.0 lb

## 2015-03-28 DIAGNOSIS — H9203 Otalgia, bilateral: Secondary | ICD-10-CM

## 2015-03-28 DIAGNOSIS — J988 Other specified respiratory disorders: Secondary | ICD-10-CM

## 2015-03-28 MED ORDER — AZITHROMYCIN 250 MG PO TABS: ORAL_TABLET | ORAL | Status: DC

## 2015-03-28 MED ORDER — PROMETHAZINE-DM 6.25-15 MG/5ML PO SYRP: 5.0000 mL | ORAL_SOLUTION | Freq: Four times a day (QID) | ORAL | Status: DC | PRN

## 2015-03-28 NOTE — Progress Notes (Signed)
Subjective:  Kelsey Olson is a 44 y.o. female who presents for illness.  Had cold few weeks ago, got better, then was around sick folks the post 2 weeks.  Now she notes about a week of cough, ear pain, not feeling well, body aches, sore throat, fever, hurts in chest from all the coughing.   Some productive green mucous .  Has felt some SOB at times.   treatment to date: cough suppressants.  No other aggravating or relieving factors.  No other c/o.  ROS as in subjective.  Past Medical History  Diagnosis Date  . Hypertension   . Tubal pregnancy   . Diabetes mellitus 2005  . Wears glasses   . Anxiety   . Chronic back pain   . Former smoker 02/2014     Objective: Filed Vitals:   03/28/15 1035  BP: 112/78  Pulse: 102  Temp: 99.1 F (37.3 C)    General appearance: Alert, WD/WN, no distress, mildly ill appearing                             Skin: warm, no rash                           Head: no sinus tenderness                            Eyes: conjunctiva normal, corneas clear, PERRLA                            Ears: flat TMs, external ear canals normal                          Nose: septum midline, turbinates swollen, with erythema and clear discharge             Mouth/throat: MMM, tongue normal, mild pharyngeal erythema                           Neck: supple, no adenopathy, no thyromegaly, nontender                          Heart: RRR, normal S1, S2, no murmurs                         Lungs: decreasd breath sounds in general, no wheezes, rales, or rhonchi     Assessment: Encounter Diagnoses  Name Primary?  Marland Kitchen Respiratory tract infection Yes  . Otalgia of both ears     Plan: Discussed diagnosis and treatment of possible early pneumonia.  Begin Zpak,promethazine DM for cough, rest, hydrate well.  Tylenol or Ibuprofen OTC for fever and malaise.  Call/return in 3-4 days if not seeing improvement or if worse.

## 2015-04-25 ENCOUNTER — Other Ambulatory Visit: Payer: Self-pay | Admitting: Medical

## 2015-04-25 NOTE — Telephone Encounter (Signed)
Needs office visit.

## 2015-05-06 ENCOUNTER — Ambulatory Visit (HOSPITAL_BASED_OUTPATIENT_CLINIC_OR_DEPARTMENT_OTHER): Payer: Commercial Managed Care - PPO | Attending: Medical

## 2015-05-23 ENCOUNTER — Other Ambulatory Visit: Payer: Self-pay | Admitting: Medical

## 2015-05-24 ENCOUNTER — Other Ambulatory Visit: Payer: Self-pay

## 2015-05-24 ENCOUNTER — Telehealth: Payer: Self-pay | Admitting: Medical

## 2015-05-24 MED ORDER — LISINOPRIL 20 MG PO TABS: 20.0000 mg | ORAL_TABLET | Freq: Every day | ORAL | Status: DC

## 2015-05-24 MED ORDER — METFORMIN HCL 500 MG PO TABS: ORAL_TABLET | ORAL | Status: DC

## 2015-05-24 NOTE — Telephone Encounter (Signed)
Pt made a diabetes ck appt for 07/04/15. Due to her work schedule this is the earliest that she can get time off for an appt. Requesting refill on Metformin  and Lisinopril  to last until appt

## 2015-05-24 NOTE — Telephone Encounter (Signed)
DONE

## 2015-06-16 ENCOUNTER — Encounter: Payer: Self-pay | Admitting: Medical

## 2015-06-22 ENCOUNTER — Telehealth: Payer: Self-pay | Admitting: Medical

## 2015-06-22 MED ORDER — METFORMIN HCL 500 MG PO TABS: ORAL_TABLET | ORAL | Status: DC

## 2015-06-22 NOTE — Telephone Encounter (Signed)
done

## 2015-06-22 NOTE — Telephone Encounter (Signed)
Pt has appt on Monday, needs refill Metformin before

## 2015-06-27 ENCOUNTER — Ambulatory Visit: Payer: Self-pay | Admitting: Medical

## 2015-07-04 ENCOUNTER — Ambulatory Visit: Payer: Self-pay | Admitting: Medical

## 2015-07-18 ENCOUNTER — Other Ambulatory Visit: Payer: Self-pay | Admitting: Medical

## 2015-07-21 ENCOUNTER — Telehealth: Payer: Self-pay

## 2015-07-21 DIAGNOSIS — Z0279 Encounter for issue of other medical certificate: Secondary | ICD-10-CM

## 2015-07-21 NOTE — Telephone Encounter (Signed)
Medical records sent out to [facility] on 07/21/15

## 2015-07-22 ENCOUNTER — Ambulatory Visit: Payer: Self-pay | Admitting: Medical

## 2015-07-29 ENCOUNTER — Encounter: Payer: Self-pay | Admitting: Medical

## 2015-08-18 ENCOUNTER — Other Ambulatory Visit: Payer: Self-pay | Admitting: Medical

## 2015-08-18 NOTE — Telephone Encounter (Signed)
Needs appt /physical 09/2015

## 2015-08-18 NOTE — Telephone Encounter (Signed)
Is this ok to refill?  

## 2015-08-23 ENCOUNTER — Other Ambulatory Visit: Payer: Self-pay | Admitting: Medical

## 2015-08-23 NOTE — Telephone Encounter (Signed)
Is this okay pt has no showed last ov

## 2015-09-19 ENCOUNTER — Other Ambulatory Visit: Payer: Self-pay | Admitting: Medical

## 2015-09-22 ENCOUNTER — Other Ambulatory Visit: Payer: Self-pay | Admitting: Medical

## 2015-09-30 ENCOUNTER — Encounter: Payer: Self-pay | Admitting: Medical

## 2015-09-30 ENCOUNTER — Other Ambulatory Visit: Payer: Self-pay | Admitting: Medical

## 2015-09-30 MED ORDER — LISINOPRIL 20 MG PO TABS: 20.0000 mg | ORAL_TABLET | Freq: Every day | ORAL | Status: DC

## 2015-09-30 NOTE — Addendum Note (Signed)
Addended by: Herminio CommonsJOHNSON, Madeleyn Schwimmer A on: 09/30/2015 09:31 AM   Modules accepted: Orders

## 2015-10-19 ENCOUNTER — Other Ambulatory Visit: Payer: Self-pay | Admitting: Medical

## 2015-11-09 ENCOUNTER — Emergency Department (HOSPITAL_COMMUNITY)
Admission: EM | Admit: 2015-11-09 | Discharge: 2015-11-09 | Disposition: A | Discharge status: Home / Self Care | Payer: 59 | Attending: Emergency Medicine | Admitting: Emergency Medicine

## 2015-11-09 ENCOUNTER — Encounter (HOSPITAL_COMMUNITY): Payer: Self-pay | Admitting: Emergency Medicine

## 2015-11-09 ENCOUNTER — Telehealth: Payer: Self-pay | Admitting: Medical

## 2015-11-09 DIAGNOSIS — Z973 Presence of spectacles and contact lenses: Secondary | ICD-10-CM | POA: Diagnosis not present

## 2015-11-09 DIAGNOSIS — G8929 Other chronic pain: Secondary | ICD-10-CM | POA: Insufficient documentation

## 2015-11-09 DIAGNOSIS — Z79899 Other long term (current) drug therapy: Secondary | ICD-10-CM | POA: Diagnosis not present

## 2015-11-09 DIAGNOSIS — Z87891 Personal history of nicotine dependence: Secondary | ICD-10-CM | POA: Insufficient documentation

## 2015-11-09 DIAGNOSIS — E119 Type 2 diabetes mellitus without complications: Secondary | ICD-10-CM | POA: Diagnosis not present

## 2015-11-09 DIAGNOSIS — I10 Essential (primary) hypertension: Secondary | ICD-10-CM | POA: Diagnosis not present

## 2015-11-09 DIAGNOSIS — R21 Rash and other nonspecific skin eruption: Secondary | ICD-10-CM | POA: Diagnosis present

## 2015-11-09 DIAGNOSIS — Z7984 Long term (current) use of oral hypoglycemic drugs: Secondary | ICD-10-CM | POA: Diagnosis not present

## 2015-11-09 DIAGNOSIS — L259 Unspecified contact dermatitis, unspecified cause: Secondary | ICD-10-CM | POA: Diagnosis not present

## 2015-11-09 DIAGNOSIS — F419 Anxiety disorder, unspecified: Secondary | ICD-10-CM | POA: Insufficient documentation

## 2015-11-09 LAB — CBG MONITORING, ED: Glucose-Capillary: 209 mg/dL — ABNORMAL HIGH (ref 65–99)

## 2015-11-09 MED ORDER — DIPHENHYDRAMINE HCL 50 MG/ML IJ SOLN: 25.0000 mg | Freq: Once | INTRAMUSCULAR | Status: DC

## 2015-11-09 MED ORDER — DIPHENHYDRAMINE HCL 25 MG PO TABS: 25.0000 mg | ORAL_TABLET | Freq: Four times a day (QID) | ORAL | Status: DC | PRN

## 2015-11-09 MED ORDER — DIPHENHYDRAMINE HCL 25 MG PO CAPS
25.0000 mg | ORAL_CAPSULE | Freq: Once | ORAL | Status: AC
  Administered 2015-11-09: 25 mg via ORAL
  Filled 2015-11-09: qty 1

## 2015-11-09 MED ORDER — TRIAMCINOLONE ACETONIDE 0.1 % EX CREA: 1.0000 "application " | TOPICAL_CREAM | Freq: Two times a day (BID) | CUTANEOUS | Status: DC

## 2015-11-09 NOTE — ED Notes (Signed)
Pt c/o vaginal swelling, itching and pain x 3 days.

## 2015-11-09 NOTE — Telephone Encounter (Signed)
Please get in touch with the patient about recent visit to the emergency department.  Remind them to use us first for non emergency issues.  The emergency dept is expensive, should be used primarily for emergencies, and they should be encouraged to come here for non emergency problems.  This saves every body money, saves them time, and is better for continuity of care.  If they are ever unsure, they can always call here first, unless after hours or weekends.   

## 2015-11-09 NOTE — Discharge Instructions (Signed)

## 2015-11-10 ENCOUNTER — Telehealth: Payer: Self-pay | Admitting: Family Medicine

## 2015-11-10 NOTE — Telephone Encounter (Signed)
Done

## 2015-11-10 NOTE — Telephone Encounter (Signed)
ER letter sent 

## 2015-11-10 NOTE — ED Provider Notes (Signed)
CSN: [id number]     Arrival date & time 11/09/15  1915 History   First MD Initiated Contact with Patient 11/09/15 1956     Chief Complaint  Patient presents with  . Vaginal Itching     (Consider location/radiation/quality/duration/timing/severity/associated sxs/prior Treatment) The history is provided by the patient.   Kelsey Olson is a 45 y.o. female presenting with an itchy rash in her groin and lower anterior abdomen which started 3 days ago.  She has itched to the point of causing abrasions and sores which are now burning and tender, especially with urination.  She started using a vagisil feminine wash a month ago which caused itching, so she switched to the non allergenic formula which did not cause any symptoms.  She tried the original product again over the past week which she believes is causing the rash.  She last used it 3 days ago.  She denies vaginal discharge, denies urinary frequency, fevers, chills, back pain, nausea or vomiting.  No risk factors for std's.  She has taken no medicines or topical treatments, has found no alleviators.  Taking a hot shower today made the itching worse.    Past Medical History  Diagnosis Date  . Hypertension   . Tubal pregnancy   . Diabetes mellitus 2005  . Wears glasses   . Anxiety   . Chronic back pain   . Former smoker 02/2014   Past Surgical History  Procedure Laterality Date  . Total abdominal hysterectomy w/ bilateral salpingoophorectomy      R   History reviewed. No pertinent family history. Social History  Substance Use Topics  . Smoking status: Former Smoker -- 0.50 packs/day for 24 years  . Smokeless tobacco: None  . Alcohol Use: No   OB History    No data available     Review of Systems  Constitutional: Negative for fever and chills.  Respiratory: Negative for shortness of breath and wheezing.   Genitourinary: Negative for dysuria, frequency, hematuria, vaginal discharge and menstrual problem.  Skin: Positive for  rash.  Neurological: Negative for numbness.      Allergies  Percocet  Home Medications   Prior to Admission medications   Medication Sig Start Date End Date Taking? Authorizing Provider  lisinopril (PRINIVIL,ZESTRIL) 20 MG tablet Take 1 tablet (20 mg total) by mouth daily. 09/30/15  Yes Kermit Balo Tysinger, PA-C  metFORMIN (GLUCOPHAGE) 500 MG tablet TAKE ONE TABLET BY MOUTH ONE TIME DAILY WITH BREAKFAST. 10/20/15  Yes Kermit Balo Tysinger, PA-C  atorvastatin (LIPITOR) 20 MG tablet Take 1 tablet (20 mg total) by mouth daily. Patient not taking: Reported on 11/09/2015 10/02/14   Kermit Balo Tysinger, PA-C  azithromycin (ZITHROMAX) 250 MG tablet 2 tablets day 1, then 1 tablet days 2-4 Patient not taking: Reported on 11/09/2015 03/28/15   Kermit Balo Tysinger, PA-C  cyclobenzaprine (FLEXERIL) 10 MG tablet 1/2-1 tablet po QHS prn for neck spasm Patient not taking: Reported on 03/28/2015 10/01/14   Kermit Balo Tysinger, PA-C  diphenhydrAMINE (BENADRYL) 25 MG tablet Take 1 tablet (25 mg total) by mouth every 6 (six) hours as needed for itching. 11/09/15   Burgess Amor, PA-C  ibuprofen (ADVIL,MOTRIN) 200 MG tablet Take 200 mg by mouth every 6 (six) hours as needed for mild pain.    Historical Provider, MD  pantoprazole (PROTONIX) 40 MG tablet Take 1 tablet (40 mg total) by mouth daily. Patient not taking: Reported on 11/09/2015 10/24/14   Burgess Amor, PA-C  polyethylene glycol powder (GLYCOLAX/MIRALAX) powder  Take 17 g by mouth 2 (two) times daily as needed. Patient not taking: Reported on 11/09/2015 10/01/14   Kermit Balo Tysinger, PA-C  promethazine-dextromethorphan (PROMETHAZINE-DM) 6.25-15 MG/5ML syrup Take 5 mLs by mouth 4 (four) times daily as needed for cough. Patient not taking: Reported on 11/09/2015 03/28/15   Kermit Balo Tysinger, PA-C  traMADol (ULTRAM) 50 MG tablet Take 1 tablet (50 mg total) by mouth every 8 (eight) hours as needed. Patient not taking: Reported on 03/28/2015 10/01/14   Kermit Balo Tysinger, PA-C   triamcinolone cream (KENALOG) 0.1 % Apply 1 application topically 2 (two) times daily. Apply bid for 7 days. 11/09/15   Burgess Amor, PA-C   BP 174/79 mmHg  Pulse 111  Temp(Src) 98.2 F (36.8 C) (Oral)  Resp 20  Ht  (1.651 m)  Wt 104.327 kg  BMI 38.27 kg/m2  SpO2 100%  LMP 11/06/2015 Physical Exam  Constitutional: She appears well-developed and well-nourished. No distress.  HENT:  Head: Normocephalic.  Neck: Neck supple.  Cardiovascular: Normal rate.   Pulmonary/Chest: Effort normal. She has no wheezes.  Musculoskeletal: Normal range of motion. She exhibits no edema.  Skin: Rash noted. Rash is maculopapular.  Macular papular rash with erythema, multiple areas of abrasions c/w scratching. No drainage, no satellite lesions.     ED Course  Procedures (including critical care time) Labs Review Labs Reviewed  CBG MONITORING, ED - Abnormal; Notable for the following:    Glucose-Capillary 209 (*)    All other components within normal limits    Imaging Review No results found. I have personally reviewed and evaluated these images and lab results as part of my medical decision-making.   EKG Interpretation None      MDM   Final diagnoses:  Contact dermatitis    Pt prescribed topical triamcinolone to avoid significant adverse reaction to blood glucose.  Benadryl for itch relief, cool compresses.  Avoid using this vagisil product.  Prn f/u with pcp or return here for worsened sx.    Burgess Amor, PA-C 11/10/15 1438  Glynn Octave, MD 11/10/15 1740

## 2015-11-16 ENCOUNTER — Other Ambulatory Visit: Payer: Self-pay | Admitting: Medical

## 2015-12-06 ENCOUNTER — Ambulatory Visit (INDEPENDENT_AMBULATORY_CARE_PROVIDER_SITE_OTHER): Payer: 59 | Admitting: Medical

## 2015-12-06 ENCOUNTER — Encounter: Payer: Self-pay | Admitting: Medical

## 2015-12-06 VITALS — BP 128/70 | HR 78 | Ht 66.25 in | Wt 236.0 lb

## 2015-12-06 DIAGNOSIS — E118 Type 2 diabetes mellitus with unspecified complications: Secondary | ICD-10-CM | POA: Diagnosis not present

## 2015-12-06 DIAGNOSIS — Z2821 Immunization not carried out because of patient refusal: Secondary | ICD-10-CM

## 2015-12-06 DIAGNOSIS — I10 Essential (primary) hypertension: Secondary | ICD-10-CM

## 2015-12-06 DIAGNOSIS — Z0000 Encounter for general adult medical examination without abnormal findings: Secondary | ICD-10-CM | POA: Diagnosis not present

## 2015-12-06 DIAGNOSIS — Z139 Encounter for screening, unspecified: Secondary | ICD-10-CM | POA: Diagnosis not present

## 2015-12-06 DIAGNOSIS — E669 Obesity, unspecified: Secondary | ICD-10-CM

## 2015-12-06 LAB — CBC
HCT: 41 % (ref 36.0–46.0)
Hemoglobin: 13.7 g/dL (ref 12.0–15.0)
MCH: 28.9 pg (ref 26.0–34.0)
MCHC: 33.4 g/dL (ref 30.0–36.0)
MCV: 86.5 fL (ref 78.0–100.0)
MPV: 10.2 fL (ref 8.6–12.4)
PLATELETS: 334 10*3/uL (ref 150–400)
RBC: 4.74 MIL/uL (ref 3.87–5.11)
RDW: 14.2 % (ref 11.5–15.5)
WBC: 5.7 10*3/uL (ref 4.0–10.5)

## 2015-12-06 LAB — POCT URINALYSIS DIPSTICK
Bilirubin, UA: NEGATIVE
Blood, UA: NEGATIVE
Ketones, UA: NEGATIVE
Leukocytes, UA: NEGATIVE
NITRITE UA: NEGATIVE
PROTEIN UA: NEGATIVE
Spec Grav, UA: >=1.03
UROBILINOGEN UA: NEGATIVE
pH, UA: 6

## 2015-12-06 LAB — COMPREHENSIVE METABOLIC PANEL
ALBUMIN: 4.2 g/dL (ref 3.6–5.1)
ALT: 18 U/L (ref 6–29)
AST: 15 U/L (ref 10–30)
Alkaline Phosphatase: 91 U/L (ref 33–115)
BUN: 10 mg/dL (ref 7–25)
CO2: 29 mmol/L (ref 20–31)
CREATININE: 1.01 mg/dL (ref 0.50–1.10)
Calcium: 9.4 mg/dL (ref 8.6–10.2)
Chloride: 98 mmol/L (ref 98–110)
Glucose, Bld: 203 mg/dL — ABNORMAL HIGH (ref 65–99)
Potassium: 4.6 mmol/L (ref 3.5–5.3)
Sodium: 134 mmol/L — ABNORMAL LOW (ref 135–146)
Total Bilirubin: 0.4 mg/dL (ref 0.2–1.2)
Total Protein: 7.6 g/dL (ref 6.1–8.1)

## 2015-12-06 LAB — LIPID PANEL
Cholesterol: 192 mg/dL (ref 125–200)
HDL: 39 mg/dL — AB (ref >=46)
LDL Cholesterol: 124 mg/dL (ref <130)
TRIGLYCERIDES: 146 mg/dL (ref <150)
Total CHOL/HDL Ratio: 4.9 Ratio (ref <=5.0)
VLDL: 29 mg/dL (ref <30)

## 2015-12-06 LAB — HEMOGLOBIN A1C
Hgb A1c MFr Bld: 8.4 % — ABNORMAL HIGH (ref <5.7)
Mean Plasma Glucose: 194 mg/dL — ABNORMAL HIGH (ref <117)

## 2015-12-06 NOTE — Progress Notes (Signed)
Subjective:   HPI  Kelsey Olson is a 45 y.o. female who presents for a complete physical.  Concerns: She is over a year past due on diabetes and BP follow but is compliant with medication.   Not compliant with exercising.  She does check sugars occasionally, usually looks fine, but levels have been elevated as of late.  She notes insurance has checklist of things that she is suppose to do including seeing other doctors?  But she is not sure what this means.  Doesn't have the paperwork with her today.  Reviewed their medical, surgical, family, social, medication, and allergy history and updated chart as appropriate.  Past Medical History  Diagnosis Date  . Hypertension   . Tubal pregnancy   . Diabetes mellitus 2005  . Wears glasses   . Anxiety   . Chronic back pain   . Former smoker 02/2014  . Obesity     Past Surgical History  Procedure Laterality Date  . Ectopic pregnancy surgery  1999    right    Social History   Social History  . Marital Status: Married    Spouse Name: N/A  . Number of Children: N/A  . Years of Education: N/A   Occupational History  . Not on file.   Social History Main Topics  . Smoking status: Former Smoker -- 0.50 packs/day for 24 years  . Smokeless tobacco: Not on file  . Alcohol Use: No  . Drug Use: No  . Sexual Activity: Not on file   Other Topics Concern  . 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  Works at [employer], Clinical biochemist rep    Family History  Problem Relation Age of Onset  . Heart disease Mother     CHF  . Diabetes Mother   . Kidney disease Mother     dialysis  . COPD Father   . Hepatitis C Father   . Cancer Father     colon  . Diabetes Sister   . Hypertension Sister   . Hypertension Brother   . Diabetes Brother   . Cancer Maternal Uncle     prostate  . Heart disease Maternal  Grandmother   . Hypertension Brother      Current outpatient prescriptions:  .  lisinopril (PRINIVIL,ZESTRIL) 20 MG tablet, Take 1 tablet (20 mg total) by mouth daily., Disp: 30 tablet, Rfl: 0 .  metFORMIN (GLUCOPHAGE) 500 MG tablet, TAKE ONE TABLET BY MOUTH ONE TIME DAILY WITH BREAKFAST., Disp: 30 tablet, Rfl: 0 .  triamcinolone cream (KENALOG) 0.1 %, Apply 1 application topically 2 (two) times daily. Apply bid for 7 days. (Patient not taking: Reported on 12/06/2015), Disp: 45 g, Rfl: 0  Allergies  Allergen Reactions  . Percocet [Oxycodone-Acetaminophen] Itching and Other (See Comments)    Hallucinations    Review of Systems Constitutional: -fever, -chills, -sweats, -unexpected weight change, -decreased appetite, -fatigue Allergy: -sneezing, -itching, -congestion Dermatology: -changing moles, +rash, -lumps ENT: -runny nose, -ear pain, -sore throat, -hoarseness, -sinus pain, -teeth pain, - ringing in ears, -hearing loss, -nosebleeds Cardiology: -chest pain, -palpitations, -swelling, -difficulty breathing when lying flat, -waking up short of breath Respiratory: -cough, -shortness of breath, -difficulty breathing with exercise or exertion, -wheezing, -coughing up blood Gastroenterology: -abdominal pain, -nausea, -vomiting, -diarrhea, -constipation, -blood in stool, -changes in bowel movement, -difficulty swallowing or eating Hematology: -bleeding, -bruising  Musculoskeletal: -  joint aches, -muscle aches, -joint swelling, -back pain, -neck pain, -cramping, -changes in gait Ophthalmology: denies vision changes, eye redness, itching, discharge Urology: -burning with urination, -difficulty urinating, -blood in urine, -urinary frequency, -urgency, -incontinence Neurology: -headache, -weakness, -tingling, -numbness, -memory loss, -falls, -dizziness Psychology: -depressed mood, -agitation, -sleep problems     Objective:   Physical Exam  BP 128/70 mmHg  Pulse 78  Ht 5' 6.25" (1.683 m)  Wt  236 lb (107.049 kg)  BMI 37.79 kg/m2  LMP 11/28/2015  General appearance: alert, no distress, WD/WN, AA female Skin: diagnosed short linear scar bilat upper posterolateral chest wall from prior heating pad burn.  Several roundish faint subcutaenous brown patches, roughly 1-59mm diameter on soles of feet unchanged per years per patient HEENT: normocephalic, conjunctiva/corneas normal, sclerae anicteric, PERRLA, EOMi, nares patent, no discharge or erythema, pharynx normal Oral cavity: MMM, tongue normal, teeth in good repair Neck: supple, no lymphadenopathy, no thyromegaly, no masses, normal ROM, no bruits Chest: non tender, normal shape and expansion Heart: RRR, normal S1, S2, no murmurs Lungs: CTA bilaterally, no wheezes, rhonchi, or rales Abdomen: +bs, soft, non tender, non distended, no masses, no hepatomegaly, no splenomegaly, no bruits Back: non tender, normal ROM, no scoliosis Musculoskeletal: upper extremities non tender, no obvious deformity, normal ROM throughout, lower extremities non tender, no obvious deformity, normal ROM throughout Extremities: no edema, no cyanosis, no clubbing Pulses: 2+ symmetric, upper and lower extremities, normal cap refill Neurological: alert, oriented x 3, CN2-12 intact, strength normal upper extremities and lower extremities, sensation normal throughout, DTRs 2+ throughout, no cerebellar signs, gait normal Psychiatric: normal affect, behavior normal, pleasant  Breast,gyn/rectal - deferred today at her request    Assessment and Plan :    Encounter Diagnoses  Name Primary?  . Encounter for health maintenance examination in adult Yes  . Type 2 diabetes mellitus with complication, without long-term current use of insulin (HCC)   . Essential hypertension   . Obesity   . Influenza vaccination declined   . Screening for condition   . Pneumococcal vaccination declined     Physical exam - discussed healthy lifestyle, diet, exercise, preventative care,  vaccinations, and addressed their concerns.  . She will return for breast/pelvic exam.   Due for baseline mammogram, due for pap See your eye doctor yearly for routine vision care. See your dentist yearly for routine dental care including hygiene visits twice yearly. Declines flu and pneumococcal vaccines. Up to date on Td, will check Hep B titer obesity - advised weight loss, lifestyle changes.  Needs to increase exercise Diabetes - labs today, needs to work on diet, exercise compliance.   C/t metformin.  HTN - compliant, labs today.   Reviewed prior EKG in chart Skin findings - benign appearing, unchanged for years per patient.   Dr. Susann Givens supervising physician also examined them.. Reassured Follow-up pending labs

## 2015-12-07 LAB — MICROALBUMIN / CREATININE URINE RATIO
Creatinine, Urine: 196 mg/dL (ref 20–320)
Microalb Creat Ratio: 5 mcg/mg creat (ref <30)
Microalb, Ur: 1 mg/dL

## 2015-12-07 LAB — HEPATITIS B SURFACE ANTIBODY, QUANTITATIVE: Hepatitis B-Post: 0.2 m[IU]/mL

## 2015-12-08 ENCOUNTER — Other Ambulatory Visit: Payer: Self-pay | Admitting: Medical

## 2015-12-08 MED ORDER — METFORMIN HCL ER 500 MG PO TB24: ORAL_TABLET | ORAL | Status: DC

## 2015-12-08 MED ORDER — LISINOPRIL 20 MG PO TABS: 20.0000 mg | ORAL_TABLET | Freq: Every day | ORAL | Status: DC

## 2015-12-10 LAB — HM DIABETES EYE EXAM

## 2015-12-12 ENCOUNTER — Telehealth: Payer: Self-pay | Admitting: Medical

## 2015-12-12 NOTE — Telephone Encounter (Signed)
Pt called back and I asked Vincenza Hews and instructions were passed on to pt.

## 2015-12-12 NOTE — Telephone Encounter (Signed)
Pt called and stated that her medication of metformin has changed. She now takes two a day. She wants to know how she is to take them. Please call pt and inform of instructions. Pt came be reached at [phone] and leave a message.

## 2015-12-12 NOTE — Telephone Encounter (Signed)
Take 2 tablets daily in the morning

## 2016-02-14 ENCOUNTER — Telehealth: Payer: Self-pay | Admitting: *Deleted

## 2016-02-14 MED ORDER — METFORMIN HCL ER 500 MG PO TB24: ORAL_TABLET | ORAL | Status: DC

## 2016-02-14 NOTE — Telephone Encounter (Signed)
Refilled and pt needs appt before any more refills

## 2016-02-14 NOTE — Telephone Encounter (Signed)
Refill request for metformin ER 500mg  #180 take 2 tabs once daily to [facility].

## 2016-02-14 NOTE — Addendum Note (Signed)
Addended by: Kieth BrightlyLAWSON, Audree Schrecengost M on: 02/14/2016 01:15 PM   Modules accepted: Orders

## 2016-03-05 ENCOUNTER — Other Ambulatory Visit: Payer: Self-pay | Admitting: Medical

## 2016-04-09 ENCOUNTER — Other Ambulatory Visit: Payer: Self-pay | Admitting: Medical

## 2016-04-09 NOTE — Telephone Encounter (Signed)
Spoke with Kelsey Olson- she states that she is not able to schedule an appt at this time because she does not have her work calendar. Advised Kelsey Olson I can refill metformin for 30 days but she needs appt. She states she will CB to schedule. [name]

## 2016-05-25 ENCOUNTER — Other Ambulatory Visit: Payer: Self-pay | Admitting: Medical

## 2016-06-30 ENCOUNTER — Other Ambulatory Visit: Payer: Self-pay | Admitting: Medical

## 2016-07-21 ENCOUNTER — Other Ambulatory Visit: Payer: Self-pay | Admitting: Medical

## 2016-09-07 ENCOUNTER — Other Ambulatory Visit: Payer: Self-pay | Admitting: Medical

## 2016-11-09 ENCOUNTER — Other Ambulatory Visit: Payer: Self-pay | Admitting: Medical

## 2016-11-16 ENCOUNTER — Ambulatory Visit
Admission: RE | Admit: 2016-11-16 | Discharge: 2016-11-16 | Disposition: A | Discharge status: Home / Self Care | Payer: 59 | Source: Ambulatory Visit | Attending: Medical | Admitting: Medical

## 2016-11-16 ENCOUNTER — Encounter: Payer: Self-pay | Admitting: Medical

## 2016-11-16 ENCOUNTER — Telehealth: Payer: Self-pay

## 2016-11-16 ENCOUNTER — Other Ambulatory Visit: Payer: Self-pay | Admitting: Medical

## 2016-11-16 ENCOUNTER — Ambulatory Visit (INDEPENDENT_AMBULATORY_CARE_PROVIDER_SITE_OTHER): Payer: 59 | Admitting: Medical

## 2016-11-16 VITALS — BP 150/68 | HR 101 | Temp 101.3°F | Wt 237.2 lb

## 2016-11-16 DIAGNOSIS — I10 Essential (primary) hypertension: Secondary | ICD-10-CM

## 2016-11-16 DIAGNOSIS — R6889 Other general symptoms and signs: Secondary | ICD-10-CM | POA: Insufficient documentation

## 2016-11-16 DIAGNOSIS — E119 Type 2 diabetes mellitus without complications: Secondary | ICD-10-CM | POA: Diagnosis not present

## 2016-11-16 DIAGNOSIS — Z91199 Patient's noncompliance with other medical treatment and regimen due to unspecified reason: Secondary | ICD-10-CM

## 2016-11-16 DIAGNOSIS — Z9119 Patient's noncompliance with other medical treatment and regimen: Secondary | ICD-10-CM

## 2016-11-16 DIAGNOSIS — R52 Pain, unspecified: Secondary | ICD-10-CM | POA: Insufficient documentation

## 2016-11-16 DIAGNOSIS — E118 Type 2 diabetes mellitus with unspecified complications: Secondary | ICD-10-CM | POA: Insufficient documentation

## 2016-11-16 LAB — CBC WITH DIFFERENTIAL/PLATELET
BASOS ABS: 0 {cells}/uL (ref 0–200)
Basophils Relative: 0 %
EOS ABS: 37 {cells}/uL (ref 15–500)
EOS PCT: 1 %
HCT: 36.6 % (ref 35.0–45.0)
HEMOGLOBIN: 12 g/dL (ref 11.7–15.5)
LYMPHS ABS: 740 {cells}/uL — AB (ref 850–3900)
Lymphocytes Relative: 20 %
MCH: 27.3 pg (ref 27.0–33.0)
MCHC: 32.8 g/dL (ref 32.0–36.0)
MCV: 83.4 fL (ref 80.0–100.0)
MPV: 10.5 fL (ref 7.5–12.5)
Monocytes Absolute: 666 cells/uL (ref 200–950)
Monocytes Relative: 18 %
NEUTROS PCT: 61 %
Neutro Abs: 2257 cells/uL (ref 1500–7800)
Platelets: 303 10*3/uL (ref 140–400)
RBC: 4.39 MIL/uL (ref 3.80–5.10)
RDW: 14.5 % (ref 11.0–15.0)
WBC: 3.7 10*3/uL — ABNORMAL LOW (ref 4.0–10.5)

## 2016-11-16 LAB — POC INFLUENZA A&B (BINAX/QUICKVUE)
Influenza A, POC: NEGATIVE
Influenza B, POC: NEGATIVE

## 2016-11-16 LAB — COMPREHENSIVE METABOLIC PANEL
ALBUMIN: 4 g/dL (ref 3.6–5.1)
ALT: 21 U/L (ref 6–29)
AST: 18 U/L (ref 10–30)
Alkaline Phosphatase: 73 U/L (ref 33–115)
BUN: 9 mg/dL (ref 7–25)
CALCIUM: 9 mg/dL (ref 8.6–10.2)
CHLORIDE: 100 mmol/L (ref 98–110)
CO2: 26 mmol/L (ref 20–31)
Creat: 1.04 mg/dL (ref 0.50–1.10)
Glucose, Bld: 232 mg/dL — ABNORMAL HIGH (ref 65–99)
Potassium: 3.8 mmol/L (ref 3.5–5.3)
Sodium: 135 mmol/L (ref 135–146)
Total Bilirubin: 0.4 mg/dL (ref 0.2–1.2)
Total Protein: 7.4 g/dL (ref 6.1–8.1)

## 2016-11-16 LAB — LIPID PANEL
CHOL/HDL RATIO: 4.9 ratio (ref <5.0)
CHOLESTEROL: 175 mg/dL (ref <200)
HDL: 36 mg/dL — ABNORMAL LOW (ref >50)
LDL Cholesterol: 117 mg/dL — ABNORMAL HIGH (ref <100)
Triglycerides: 109 mg/dL (ref <150)
VLDL: 22 mg/dL (ref <30)

## 2016-11-16 LAB — TSH: TSH: 0.52 m[IU]/L

## 2016-11-16 LAB — HEMOGLOBIN A1C
HEMOGLOBIN A1C: 9.3 % — AB (ref <5.7)
MEAN PLASMA GLUCOSE: 220 mg/dL

## 2016-11-16 MED ORDER — OSELTAMIVIR PHOSPHATE 75 MG PO CAPS: 75.0000 mg | ORAL_CAPSULE | Freq: Two times a day (BID) | ORAL | 0 refills | Status: DC

## 2016-11-16 MED ORDER — LISINOPRIL 20 MG PO TABS: ORAL_TABLET | ORAL | 0 refills | Status: DC

## 2016-11-16 MED ORDER — METFORMIN HCL ER 500 MG PO TB24: ORAL_TABLET | ORAL | 0 refills | Status: DC

## 2016-11-16 NOTE — Progress Notes (Signed)
Subjective: Chief Complaint  Patient presents with  . dm check , headaches, bodyaches    dm check , bodyaches, headaches, sore throat.   Scheduled today for diabetes check but she has body aches, chills, sore throat, and fever noted on triage.   Started yesterday with body aches, top of head to bottom of feet, throat sore, feels the need to cough, hurts in chest to cough, had fever yesterday and today, headache, dizzy.   Chest pain is a burn, no radiating pain.  No SOB.  No swelling.   Everybody at work has had the flu.  Works at [employer].  She left work early yesterday due to illness.  Using coricidin cold and flu.   No rash, no neck stiffness.       Checking glucose, running in the 200s.  Checks usually most days per week.  BPs at home run ok.  Not much exercise.   Of note, she hasn't been back since 11/2015.    She notes being compliant with medication.  Past Medical History:  Diagnosis Date  . Anxiety   . Chronic back pain   . Diabetes mellitus 2005  . Former smoker 02/2014  . Hypertension   . Obesity   . Tubal pregnancy   . Wears glasses    Current Outpatient Prescriptions on File Prior to Visit  Medication Sig Dispense Refill  . lisinopril (PRINIVIL,ZESTRIL) 20 MG tablet TAKE 1 TABLET (20 MG TOTAL) BY MOUTH DAILY. 90 tablet 0  . lisinopril (PRINIVIL,ZESTRIL) 20 MG tablet TAKE 1 TABLET (20 MG TOTAL) BY MOUTH DAILY. 90 tablet 1  . metFORMIN (GLUCOPHAGE-XR) 500 MG 24 hr tablet TAKE 2 TABLETS BY MOUTH EVERY DAY (NEED APPOINTMENT FOR FURTHER REFILLS) 60 tablet 0   No current facility-administered medications on file prior to visit.    ROS as in subjective  Objective: BP (!) 150/68   Pulse (!) 101   Temp (!) 101.3 F (38.5 C)   Wt 237 lb 3.2 oz (107.6 kg)   SpO2 97%   BMI 38.00 kg/m   BP Readings from Last 3 Encounters:  11/16/16 (!) 150/68  12/06/15 128/70  11/09/15 174/79   Wt Readings from Last 3 Encounters:  11/16/16 237 lb 3.2 oz (107.6 kg)  12/06/15 236  lb (107 kg)  11/09/15 230 lb (104.3 kg)   General: Ill-appearing, well-developed, well-nourished Mildly dehydrated appearing, somewhat dry MM Skin: Hot, dry HEENT: Nose inflamed and congested, clear conjunctiva, TMs pearly, no sinus tenderness, pharynx with erythema, no exudates Neck: Supple, non tender, shotty cervical adenopathy Heart: Regular rate and rhythm, normal S1, S2, no murmurs Lungs: Clear to auscultation bilaterally, no wheezes, rales, rhonchi Abdomen: Non tender non distended Extremities: Mild generalized tenderness, no edema Pulses 2+ UE and LE    Assessment: Encounter Diagnoses  Name Primary?  . Flu-like symptoms Yes  . Body aches   . Essential hypertension   . Type 2 diabetes mellitus without complication, without long-term current use of insulin (HCC)   . Noncompliance     Plan: I suspect influenza.   But we will send for CXR as well.  Presumptively, discussed diagnosis of influenza. Discussed supportive care including rest, hydration, OTC Tylenol or NSAID for fever, aches, and malaise.  Discussed period of contagion, self quarantine at home away from others to avoid spread of disease, discussed means of transmission, and possible complications including pneumonia.  If worse or not improving within the next 4-5 days, then call or return.  Patient voiced  understanding of diagnosis, recommendations, and treatment plan.  This visit focused on her illness, and we spent limited time on med check given her illness.  We did do labs today, advised she needs to come in more than once yearly for diabetes f/u, and of note, last visit she was reluctant to implement my recommendations, was trying to work on diet and changes on her on.    Avneet was seen today for dm check , headaches, bodyaches.  Diagnoses and all orders for this visit:  Flu-like symptoms -     DG Chest 2 View; Future -     Pulse oximetry (single); Future  Body aches -     DG Chest 2 View; Future -      Pulse oximetry (single); Future  Essential hypertension -     Comprehensive metabolic panel -     CBC with Differential/Platelet -     Lipid panel -     TSH -     Hemoglobin A1c -     HM DIABETES EYE EXAM -     HM DIABETES FOOT EXAM  Type 2 diabetes mellitus without complication, without long-term current use of insulin (HCC) -     DG Chest 2 View; Future -     Pulse oximetry (single); Future -     Comprehensive metabolic panel -     CBC with Differential/Platelet -     Lipid panel -     TSH -     Hemoglobin A1c -     HM DIABETES EYE EXAM -     HM DIABETES FOOT EXAM  Noncompliance

## 2016-11-16 NOTE — Patient Instructions (Signed)
Thank you for giving me the opportunity to serve you today.   Your diagnosis today includes: Encounter Diagnoses  Name Primary?  . Flu-like symptoms Yes  . Body aches   . Essential hypertension   . Type 2 diabetes mellitus without complication, without long-term current use of insulin (HCC)   . Noncompliance    Go for chest xray  I need to see you more than once yearly for diabetes.   Until diabetes is well controlled, we typically have follow up every 3 months.  Diabetes is a serious chronic disease with many potential complications.  unfortunately we didn't get to go in great detail today as you are sick.   Lets get you better, then we can further address diabetes and medications.   You presumptively have the flu unless your chest xray shows otherwise.   Influenza, Adult Influenza ("the flu") is a viral infection of the respiratory tract. It causes chills, fever, cough, headache, body aches, and sore throat. Influenza in general will make you feel sicker than when you have a common cold. Symptoms of the illness typically last a few days. Cough and fatigue may continue for as long as 7 to 10 days. Influenza is highly contagious. It spreads easily to others in the droplets from coughs and sneezes. People frequently become infected by touching something that was recently contaminated with the virus and then touch their mouth, nose or eyes. This infection is caused by a virus. Symptoms will not be reduced or improved by taking an antibiotic. Antibiotics are medications that kill bacteria, not viruses. DIAGNOSIS  Diagnosis of influenza is often made based on the history and physical examination as well as the presence of influenza reports occurring in your community. Testing can be done if the diagnosis is not certain. TREATMENT  Since influenza is caused by a virus, antibiotics are not helpful. Your caregiver may prescribe antiviral medicines to shorten the illness and lessen the severity. Your  caregiver may also recommend influenza vaccination and/or antiviral medicines for your family members in order to prevent the spread of influenza to them. HOME CARE INSTRUCTIONS  DO NOT GIVE ASPIRIN TO PERSONS WITH INFLUENZA WHO ARE UNDER 11 YEARS OF AGE. This could lead to brain and liver damage (Reye's syndrome). Read the label on over-the-counter medicines.   Stay home from work or school if at all possible until most of your symptoms are gone.   Only take over-the-counter or prescription medicines for pain, discomfort, or fever as directed by your caregiver.   Use a cool mist humidifier to increase air moisture. This will make breathing easier.   Rest until your temperature is nearly normal: 98.6 F (37 C). This usually takes 3 to 4 days. Be sure you get plenty of rest.   Drink at least eight, eight-ounce glasses of fluids per day. Fluids include water, juice, broth, gelatin, or lemonade.   Cover your mouth and nose when coughing or sneezing and wash your hands often to prevent the spread of this virus to other persons.  PREVENTION  Annual influenza vaccination (flu shots) is the best way to avoid getting influenza. An annual flu shot is now routinely recommended for all adults in the [place] SEEK MEDICAL CARE IF:   You develop shortness of breath while resting.   You have a deep cough with production of mucous or chest pain.   You develop nausea (feeling sick to your stomach), vomiting, or diarrhea.  SEEK IMMEDIATE MEDICAL CARE IF:   You have  difficulty breathing, become short of breath, or your skin or nails turn bluish.   You develop severe neck pain or stiffness.   You develop a severe headache, facial pain, or earache.   You have a fever.   You develop nausea or vomiting that cannot be controlled.  Document Released: 09/28/2000 Document Revised: 06/13/2011 Document Reviewed: 08/03/2009 [facility] Patient Information 2012 [place], [place].

## 2016-11-16 NOTE — Addendum Note (Signed)
Addended by: Winn JockVALENTINE, Jeziel Hoffmann N on: 11/16/2016 10:24 AM   Modules accepted: Orders

## 2016-11-16 NOTE — Telephone Encounter (Signed)
Pt called back and wanted a refill on her metformin because she was out  , because was out. I explain per shane that were  Waiting to see what the labs showed. Because we my have to change her meds. She didn't want to wait until Monday to labs to come in, sent refill to pharmacy. I told her keep in mind that we may have to change meds.

## 2016-11-26 ENCOUNTER — Ambulatory Visit (INDEPENDENT_AMBULATORY_CARE_PROVIDER_SITE_OTHER): Payer: 59 | Admitting: Medical

## 2016-11-26 ENCOUNTER — Encounter: Payer: Self-pay | Admitting: Medical

## 2016-11-26 VITALS — BP 134/72 | HR 93 | Wt 236.6 lb

## 2016-11-26 DIAGNOSIS — E119 Type 2 diabetes mellitus without complications: Secondary | ICD-10-CM

## 2016-11-26 DIAGNOSIS — L309 Dermatitis, unspecified: Secondary | ICD-10-CM | POA: Diagnosis not present

## 2016-11-26 DIAGNOSIS — I10 Essential (primary) hypertension: Secondary | ICD-10-CM | POA: Diagnosis not present

## 2016-11-26 MED ORDER — METFORMIN HCL ER 500 MG PO TB24: ORAL_TABLET | ORAL | 1 refills | Status: DC

## 2016-11-26 MED ORDER — LISINOPRIL 20 MG PO TABS: ORAL_TABLET | ORAL | 3 refills | Status: DC

## 2016-11-26 MED ORDER — TRIAMCINOLONE ACETONIDE 0.5 % EX OINT: 1.0000 "application " | TOPICAL_OINTMENT | Freq: Two times a day (BID) | CUTANEOUS | 0 refills | Status: DC

## 2016-11-26 NOTE — Progress Notes (Signed)
Subjective: Chief Complaint  Patient presents with  . discuss labs results    disscuss labs    Here for diabetes recheck.  I saw her recently for diabetes f/u but she had the flu that day.    Not exercising, and she recognizes this is a problem.  Comes home emotional and physical exhausted.    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   Her employer is changing things to get employees up and moving, more physical.  Diet - last few months not eating like she should.     She notes she can do better with diet, exercise.   Doesn't want to do cholesterol medication at this time.   Does get loose stools on metformin.  Wants to use BID instead of 2 tablets daily.   Wants refill on ointment she has used prior on neck.   Thinks its related to bumps from chemicals in her hair.  Past Medical History:  Diagnosis Date  . Anxiety   . Chronic back pain   . Diabetes mellitus 2005  . Former smoker 02/2014  . Hypertension   . Obesity   . Tubal pregnancy   . Wears glasses    No current outpatient prescriptions on file prior to visit.   No current facility-administered medications on file prior to visit.    ROS as in subjective   Objective: BP 134/72   Pulse 93   Wt 236 lb 9.6 oz (107.3 kg)   SpO2 97%   BMI 37.90 kg/m   Wt Readings from Last 3 Encounters:  11/26/16 236 lb 9.6 oz (107.3 kg)  11/16/16 237 lb 3.2 oz (107.6 kg)  12/06/15 236 lb (107 kg)   BP Readings from Last 3 Encounters:  11/26/16 134/72  11/16/16 (!) 150/68  12/06/15 128/70    Gen: wd, wn, nad  Diabetic Foot Exam - Simple   Simple Foot Form Diabetic Foot exam was performed with the following findings:  Yes 11/26/2016  9:44 AM  Visual Inspection No deformities, no ulcerations, no other skin breakdown bilaterally:  Yes Sensation Testing Intact to touch and monofilament testing bilaterally:  Yes Pulse Check Posterior Tibialis and Dorsalis pulse intact bilaterally:  Yes Comments       Assessment: Encounter Diagnoses  Name Primary?  . Essential hypertension Yes  . Type 2 diabetes mellitus without complication, without long-term current use of insulin (HCC)   . Dermatitis      Plan: Spent 45 minutes discussing diet, exercise, medications, routine diabetes care and monitoring.   She seems motivated on diet and exercise.  She declines statin or other medication changes today.  She will change metformin to 1 tablet BID, c/t lisinopril.  She was counseled on daily feet checks, yearly eye and twice yearly dental visits.   She declines vaccines today as discussed - flu, pneumococcal, hep B.  F/u 73mo.   [name] was seen today for discuss labs results.  Diagnoses and all orders for this visit:  Essential hypertension  Type 2 diabetes mellitus without complication, without long-term current use of insulin (HCC)  Dermatitis  Other orders -     lisinopril (PRINIVIL,ZESTRIL) 20 MG tablet; TAKE 1 TABLET (20 MG TOTAL) BY MOUTH DAILY. -     metFORMIN (GLUCOPHAGE-XR) 500 MG 24 hr tablet; 1 tablet po BID -     triamcinolone ointment (KENALOG) 0.5 %; Apply 1 application topically 2 (two) times daily.

## 2017-01-14 ENCOUNTER — Other Ambulatory Visit: Payer: Self-pay | Admitting: Medical

## 2017-01-18 ENCOUNTER — Ambulatory Visit (INDEPENDENT_AMBULATORY_CARE_PROVIDER_SITE_OTHER): Payer: 59 | Admitting: Family Medicine

## 2017-01-18 VITALS — BP 150/72 | HR 85 | Temp 98.7°F | Resp 16 | Ht 66.25 in | Wt 241.0 lb

## 2017-01-18 DIAGNOSIS — H01002 Unspecified blepharitis right lower eyelid: Secondary | ICD-10-CM

## 2017-01-18 DIAGNOSIS — H0100A Unspecified blepharitis right eye, upper and lower eyelids: Secondary | ICD-10-CM

## 2017-01-18 DIAGNOSIS — H01001 Unspecified blepharitis right upper eyelid: Secondary | ICD-10-CM

## 2017-01-18 MED ORDER — ERYTHROMYCIN 5 MG/GM OP OINT: 1.0000 "application " | TOPICAL_OINTMENT | Freq: Four times a day (QID) | OPHTHALMIC | 0 refills | Status: DC

## 2017-01-18 NOTE — Patient Instructions (Addendum)
.I recommend warm compresses to both eyes for 5-10 minutes twice daily.  This will raise the temperature above the melting point of meibomian gland secretions and aide in expression of any secretions that might be blocking the glands. Eyelid scrubs will clear away the scales on the lashes. Gently scrub the eyelids with a moist washcloth with diluted baby shampoo or other commercially available eyelid-cleansing agents twice daily. Both symptoms and clinical findings of blepharitis have been shown to improve with lid-scrub regimens.     IF you received an x-ray today, you will receive an invoice from [facility]. Please contact [facility] at [phone] with questions or concerns regarding your invoice.   IF you received labwork today, you will receive an invoice from [facility]. Please contact [facility] at [phone] with questions or concerns regarding your invoice.   Our billing staff will not be able to assist you with questions regarding bills from these companies.  You will be contacted with the lab results as soon as they are available. The fastest way to get your results is to activate your My Chart account. Instructions are located on the last page of this paperwork. If you have not heard from [place] regarding the results in 2 weeks, please contact this office.     Blepharitis Blepharitis is inflammation of the eyelids. Blepharitis may happen with:  Reddish, scaly skin around the scalp and eyebrows.  Burning or itching of the eyelids.  Eye discharge at night that causes the eyelashes to stick together in the morning.  Eyelashes that fall out.  Sensitivity to light. Follow these instructions at home: Pay attention to any changes in how you look or feel. Follow these instructions to help with your condition: Keeping Clean   Wash your hands often.  Wash your eyelids with warm water or with warm water that is mixed with a small amount of baby shampoo. Do this two  times per day or as often as needed.  Wash your face and eyebrows at least once a day.  Use a clean towel each time you dry your eyelids. Do not use this towel to clean or dry other areas of your body. Do not share your towel with anyone. General instructions   Avoid wearing makeup until you get better. Do not share makeup with anyone.  Avoid rubbing your eyes.  Apply warm compresses to your eyes 2 times per day for 10 minutes at a time, or as told by your health care provider.  If you were prescribed an antibiotic ointment or steroid drops, apply or use the medicine as told by your health care provider. Do not stop using the medicine even if you feel better.  Keep all follow-up visits as told by your health care provider. This is important. Contact a health care provider if:  Your eyelids feel hot.  You have blisters or a rash on your eyelids.  The condition does not go away in 2-4 days.  The inflammation gets worse. Get help right away if:  You have pain or redness that gets worse or spreads to other parts of your face.  Your vision changes.  You have pain when looking at lights or moving objects.  You have a fever. This information is not intended to replace advice given to you by your health care provider. Make sure you discuss any questions you have with your health care provider. Document Released: 09/28/2000 Document Revised: 03/08/2016 Document Reviewed: 01/24/2015 Elsevier Interactive Patient Education  2017 [employer].

## 2017-01-18 NOTE — Progress Notes (Signed)
Subjective:  By signing my name below, I, [name], attest that this documentation has been prepared under the direction and in the presence of Norberto Sorenson, MD Electronically Signed: Charline Bills, ED Scribe 01/18/2017 at 8:47 AM.   Patient ID: Kelsey Olson, female    DOB: [birth year], 46 y.o.   MRN: [id number]  Chief Complaint  Patient presents with  . Conjunctivitis    Right eye, no visual disturbances   HPI  Kelsey Olson is a 46 y.o. female who presents to Primary Care at [facility] complaining of sudden onset of right eye lid pain first noticed last night. Pt reports associated symptoms of right eyelid swelling, green/yellow discharge last night with crusting this morning, blood tinged discharge this morning and photophobia onset this morning. She has tried applying artificial tears yesterday without significant relief. Pt reports 2 recent cases of conjunctivitis at work. She does wear contact lenses but states that she has not worn them within the past 6 days. She denies eye pain, eye redness, blurred vision, HA, nasal congestion, cough, sore throat. No recent antibiotic use. No known antibiotic allergies.   Past Medical History:  Diagnosis Date  . Anxiety   . Chronic back pain   . Diabetes mellitus 2005  . Former smoker 02/2014  . Hypertension   . Obesity   . Tubal pregnancy   . Wears glasses    Current Outpatient Prescriptions on File Prior to Visit  Medication Sig Dispense Refill  . lisinopril (PRINIVIL,ZESTRIL) 20 MG tablet TAKE 1 TABLET (20 MG TOTAL) BY MOUTH DAILY. 90 tablet 3  . metFORMIN (GLUCOPHAGE-XR) 500 MG 24 hr tablet 1 tablet po BID 180 tablet 1  . triamcinolone ointment (KENALOG) 0.5 % APPLY TOPICALLY TWICE A DAY 30 g 0   No current facility-administered medications on file prior to visit.    Allergies  Allergen Reactions  . Percocet [Oxycodone-Acetaminophen] Itching and Other (See Comments)    Hallucinations     Visual Acuity Screening   Right eye Left  eye Both eyes  Without correction:     With correction:   Review of Systems  HENT: Positive for facial swelling (R eyelid). Negative for congestion and sore throat.   Eyes: Positive for photophobia and discharge. Negative for pain, redness and visual disturbance.  Respiratory: Negative for cough.   Neurological: Negative for headaches.      Objective:   Physical Exam  Constitutional: She is oriented to person, place, and time. She appears well-developed and well-nourished. No distress.  HENT:  Head: Normocephalic and atraumatic.  Eyes: Conjunctivae and EOM are normal. Pupils are equal, round, and reactive to light. Right eye exhibits discharge.  Green discharge on medial and lateral aspect of R eye. Mild amount of edema and erythema over upper and lower lid with induration, no fluctuance, worse over the medial aspect. Funduscopic exam benign.   Neck: Neck supple. No tracheal deviation present.  Cardiovascular: Normal rate.   Pulmonary/Chest: Effort normal. No respiratory distress.  Musculoskeletal: Normal range of motion.  Neurological: She is alert and oriented to person, place, and time.  Skin: Skin is warm and dry.  Psychiatric: She has a normal mood and affect. Her behavior is normal.  Nursing note and vitals reviewed.  BP (!) 141/81   Pulse 85   Temp 98.7 F (37.1 C) (Oral)   Resp 16   Ht 5' 6.25" (1.683 m)   Wt 241 lb (109.3 kg)   SpO2 99%   BMI 38.61  kg/m     Assessment & Plan:   1. Blepharitis of both upper and lower eyelid of right eye, unspecified type     Meds ordered this encounter  Medications  . erythromycin [facility]) ophthalmic ointment    Sig: Place 1 application into the right eye 4 (four) times daily.    Dispense:  3.5 g    Refill:  0    I personally performed the services described in this documentation, which was scribed in my presence. The recorded information has been reviewed and considered, and addended by me as needed.    Norberto Sorenson, M.D.  Primary Care at [facility] [address] [place], [place] [address] [phone] phone [phone] fax  01/20/17 9:40 PM

## 2017-03-15 ENCOUNTER — Other Ambulatory Visit: Payer: Self-pay | Admitting: Medical

## 2017-06-15 ENCOUNTER — Other Ambulatory Visit: Payer: Self-pay | Admitting: Medical

## 2017-07-11 ENCOUNTER — Ambulatory Visit (INDEPENDENT_AMBULATORY_CARE_PROVIDER_SITE_OTHER): Payer: 59 | Admitting: Medical

## 2017-07-11 VITALS — BP 132/80 | HR 75 | Wt 242.2 lb

## 2017-07-11 DIAGNOSIS — Z2821 Immunization not carried out because of patient refusal: Secondary | ICD-10-CM

## 2017-07-11 DIAGNOSIS — E669 Obesity, unspecified: Secondary | ICD-10-CM | POA: Insufficient documentation

## 2017-07-11 DIAGNOSIS — E785 Hyperlipidemia, unspecified: Secondary | ICD-10-CM | POA: Insufficient documentation

## 2017-07-11 DIAGNOSIS — I10 Essential (primary) hypertension: Secondary | ICD-10-CM

## 2017-07-11 DIAGNOSIS — E1169 Type 2 diabetes mellitus with other specified complication: Secondary | ICD-10-CM

## 2017-07-11 LAB — POCT GLYCOSYLATED HEMOGLOBIN (HGB A1C): Hemoglobin A1C: 5.5

## 2017-07-11 NOTE — Progress Notes (Signed)
Subjective: Chief Complaint  Patient presents with  . med check    med check , no other concerns    Here for recheck on diabetes, BP, cholesterol.  Checking sugars regularly, seeing 220s in the morning.  Has gained some weight although she is working on improving diet.   Just started exercise routine last week.  No blurred vision, no polydipsia, no polyuria.   Saw eye doctor in March.  No foot lesions, checking feet daily.  Not checking BP.  She notes that she honestly states she hasn't done her part since 11/2016 to get her diet and health under better control.   At home she does fine, but she sits all day at work and doesn't do well with diet at work.   She and 2 coworkers just challenged each other to get their health in better shape.     Past Medical History:  Diagnosis Date  . Anxiety   . Chronic back pain   . Diabetes mellitus 2005  . Former smoker 02/2014  . Hypertension   . Obesity   . Tubal pregnancy   . Wears glasses    Current Outpatient Prescriptions on File Prior to Visit  Medication Sig Dispense Refill  . lisinopril (PRINIVIL,ZESTRIL) 20 MG tablet TAKE 1 TABLET (20 MG TOTAL) BY MOUTH DAILY. 90 tablet 0  . metFORMIN (GLUCOPHAGE-XR) 500 MG 24 hr tablet 1 tablet po BID 180 tablet 1  . triamcinolone ointment (KENALOG) 0.5 % APPLY TOPICALLY TWICE A DAY 30 g 0   No current facility-administered medications on file prior to visit.    ROS as in subjective   Objective: BP 132/80   Pulse 75   Wt 242 lb 3.2 oz (109.9 kg)   SpO2 98%   BMI 38.80 kg/m   Wt Readings from Last 3 Encounters:  07/11/17 242 lb 3.2 oz (109.9 kg)  01/18/17 241 lb (109.3 kg)  11/26/16 236 lb 9.6 oz (107.3 kg)   BP Readings from Last 3 Encounters:  07/11/17 132/80  01/18/17 (!) 150/72  11/26/16 134/72    Gen: wd, wn, nad Heart: rrr, normal s1, s2, no murmurs Lungs clear Ext: no edema  pulses 2+   Assessment: Encounter Diagnoses  Name Primary?  . Diabetes mellitus type 2 in obese (HCC)  Yes  . Obesity with serious comorbidity, unspecified classification, unspecified obesity type   . Pneumococcal vaccination declined   . Influenza vaccination declined   . Essential hypertension   . Dyslipidemia      Plan: Spent 45 minutes discussing diet, exercise, medications, routine diabetes care and monitoring.   C/t same medications.   I recommended we add Jardiance or Victoza, and add statin.  She wants to think about both of these.  She declines vaccine recommendations today.  Discussed the fact that she has to be willing to take responsibility for her health and make the necessary diet and exercise changes.   She declines medication changes today.  F/u 4mo.   Brytney was seen today for med check.  Diagnoses and all orders for this visit:  Diabetes mellitus type 2 in obese (HCC) -     HgB A1c -     Comprehensive metabolic panel -     CBC with Differential/Platelet -     Lipid panel  Obesity with serious comorbidity, unspecified classification, unspecified obesity type  Pneumococcal vaccination declined  Influenza vaccination declined  Essential hypertension  Dyslipidemia

## 2017-07-11 NOTE — Patient Instructions (Addendum)
Recommendations  Try to exercise 3-5 times per week or get some activity daily  Get with a trainer to work on a meal plan  Set goals for your fitness and weight  continue your current medications  I recommend we add either Jardiance tablet once daily or Victoza daily injection for both diabetes sugar control and weight loss  I also recommend a statin such as Pravachol to lower cholesterol and lower heart disease risk  Diet  Increase your water intake, get at least 64 ounces of water daily  Eat 3-4 fruits daily  Eat plenty of vegetables throughout the day, preferably each meal  Eat good sources of grains such as oatmeal, barley, whole grain pasta, whole grain bread, but limit the serving size to 1 cup of oatmeal or pasta per meal or 2 slices of bread per meal  We don't need to meat at each meal, however if you do eat meat, limit serving size to the size of your palm, and eat chicken fish or [place], lean cuts of meat  Eat beans every day as this is a good nutrient source and helps to curb appetite  Consider using a program such as Weight Watchers  Consider using a Smart phone app such as My Fitness PAL or [employer] to track your calories and progress   Things to limit or avoid:  Avoid fast food, fried foods, fatty foods  Limit sweets, ice cream, cake and other baked goods  Avoid soda, beer, alcohol, sweet tea   Empagliflozin oral tablets What is this medicine? EMPAGLIFLOZIN (EM pa gli FLOE zin) helps to treat type 2 diabetes. It helps to control blood sugar. This drug may also reduce the risk of heart attack or stroke if you have type 2 diabetes and risk factors for heart disease. Treatment is combined with diet and exercise. This medicine may be used for other purposes; ask your health care provider or pharmacist if you have questions. COMMON BRAND NAME(S): JARDIANCE What should I tell my health care provider before I take this medicine? They need to know if you have  any of these conditions: -dehydration -diabetic ketoacidosis -diet low in salt -eating less due to illness, surgery, dieting, or any other reason -having surgery -high cholesterol -high levels of potassium in the blood -history of pancreatitis or pancreas problems -history of yeast infection of the penis or vagina -if you often drink alcohol -infections in the bladder, kidneys, or urinary tract -kidney disease -liver disease -low blood pressure -on hemodialysis -problems urinating -type 1 diabetes -uncircumcised female -an unusual or allergic reaction to empagliflozin, other medicines, foods, dyes, or preservatives -pregnant or trying to get pregnant -breast-feeding How should I use this medicine? Take this medicine by mouth with a glass of water. Follow the directions on the prescription label. Take it in the morning, with or without food. Take your dose at the same time each day. Do not take more often than directed. Do not stop taking except on your doctor's advice. Talk to your pediatrician regarding the use of this medicine in children. Special care may be needed. Overdosage: If you think you have taken too much of this medicine contact a poison control center or emergency room at once. NOTE: This medicine is only for you. Do not share this medicine with others. What if I miss a dose? If you miss a dose, take it as soon as you can. If it is almost time for your next dose, take only that dose. Do not take  double or extra doses. What may interact with this medicine? Do not take this medicine with any of the following medications: -gatifloxacin This medicine may also interact with the following medications: -alcohol -certain medicines for blood pressure, heart disease -diuretics This list may not describe all possible interactions. Give your health care provider a list of all the medicines, herbs, non-prescription drugs, or dietary supplements you use. Also tell them if you smoke,  drink alcohol, or use illegal drugs. Some items may interact with your medicine. What should I watch for while using this medicine? Visit your doctor or health care professional for regular checks on your progress. This medicine can cause a serious condition in which there is too much acid in the blood. If you develop nausea, vomiting, stomach pain, unusual tiredness, or breathing problems, stop taking this medicine and call your doctor right away. If possible, use a ketone dipstick to check for ketones in your urine. A test called the HbA1C (A1C) will be monitored. This is a simple blood test. It measures your blood sugar control over the last 2 to 3 months. You will receive this test every 3 to 6 months. Learn how to check your blood sugar. Learn the symptoms of low and high blood sugar and how to manage them. Always carry a quick-source of sugar with you in case you have symptoms of low blood sugar. Examples include hard sugar candy or glucose tablets. Make sure others know that you can choke if you eat or drink when you develop serious symptoms of low blood sugar, such as seizures or unconsciousness. They must get medical help at once. Tell your doctor or health care professional if you have high blood sugar. You might need to change the dose of your medicine. If you are sick or exercising more than usual, you might need to change the dose of your medicine. Do not skip meals. Ask your doctor or health care professional if you should avoid alcohol. Many nonprescription cough and cold products contain sugar or alcohol. These can affect blood sugar. Wear a medical ID bracelet or chain, and carry a card that describes your disease and details of your medicine and dosage times. What side effects may I notice from receiving this medicine? Side effects that you should report to your doctor or health care professional as soon as possible: -allergic reactions like skin rash, itching or hives, swelling of the  face, lips, or tongue -breathing problems -dizziness -fast or irregular heartbeat -feeling faint or lightheaded, falls -muscle weakness -nausea, vomiting, unusual stomach upset or pain -signs and symptoms of low blood sugar such as feeling anxious, confusion, dizziness, increased hunger, unusually weak or tired, sweating, shakiness, cold, irritable, headache, blurred vision, fast heartbeat, loss of consciousness -signs and symptoms of a urinary tract infection, such as fever, chills, a burning feeling when urinating, blood in the urine, back pain -trouble passing urine or change in the amount of urine, including an urgent need to urinate more often, in larger amounts, or at night -penile discharge, itching, or pain in men -unusual tiredness -vaginal discharge, itching, or odor in women Side effects that usually do not require medical attention (report to your doctor or health care professional if they continue or are bothersome): -joint pain -mild increase in urination -thirsty This list may not describe all possible side effects. Call your doctor for medical advice about side effects. You may report side effects to FDA at 1-800-FDA-1088. Where should I keep my medicine? Keep out of the  reach of children. Store at room temperature between 20 and 25 degrees C (68 and 77 degrees F). Throw away any unused medicine after the expiration date. NOTE: This sheet is a summary. It may not cover all possible information. If you have questions about this medicine, talk to your doctor, pharmacist, or health care provider.  2018 Elsevier/Gold Standard (2015-11-03 11:46:10)    Liraglutide injection What is this medicine? LIRAGLUTIDE (LIR a GLOO tide) is used to improve blood sugar control in adults with type 2 diabetes. This medicine may be used with other diabetes medicines. This drug may also reduce the risk of heart attack or stroke if you have type 2 diabetes and risk factors for heart  disease. This medicine may be used for other purposes; ask your health care provider or pharmacist if you have questions. COMMON BRAND NAME(S): Victoza What should I tell my health care provider before I take this medicine? They need to know if you have any of these conditions: -endocrine tumors (MEN 2) or if someone in your family had these tumors -gallbladder disease -high cholesterol -history of alcohol abuse problem -history of pancreatitis -kidney disease or if you are on dialysis -liver disease -previous swelling of the tongue, face, or lips with difficulty breathing, difficulty swallowing, hoarseness, or tightening of the throat -stomach problems -thyroid cancer or if someone in your family had thyroid cancer -an unusual or allergic reaction to liraglutide, other medicines, foods, dyes, or preservatives -pregnant or trying to get pregnant -breast-feeding How should I use this medicine? This medicine is for injection under the skin of your upper leg, stomach area, or upper arm. You will be taught how to prepare and give this medicine. Use exactly as directed. Take your medicine at regular intervals. Do not take it more often than directed. It is important that you put your used needles and syringes in a special sharps container. Do not put them in a trash can. If you do not have a sharps container, call your pharmacist or healthcare provider to get one. A special MedGuide will be given to you by the pharmacist with each prescription and refill. Be sure to read this information carefully each time. Talk to your pediatrician regarding the use of this medicine in children. Special care may be needed. Overdosage: If you think you have taken too much of this medicine contact a poison control center or emergency room at once. NOTE: This medicine is only for you. Do not share this medicine with others. What if I miss a dose? If you miss a dose, take it as soon as you can. If it is almost  time for your next dose, take only that dose. Do not take double or extra doses. What may interact with this medicine? -other medicines for diabetes Many medications may cause changes in blood sugar, these include: -alcohol containing beverages -antiviral medicines for HIV or AIDS -aspirin and aspirin-like drugs -certain medicines for blood pressure, heart disease, irregular heart beat -chromium -diuretics -female hormones, such as estrogens or progestins, birth control pills -fenofibrate -gemfibrozil -isoniazid -lanreotide -female hormones or anabolic steroids -MAOIs like Carbex, Eldepryl, Marplan, Nardil, and Parnate -medicines for weight loss -medicines for allergies, asthma, cold, or cough -medicines for depression, anxiety, or psychotic disturbances -niacin -nicotine -NSAIDs, medicines for pain and inflammation, like ibuprofen or naproxen -octreotide -pasireotide -pentamidine -phenytoin -probenecid -quinolone antibiotics such as ciprofloxacin, levofloxacin, ofloxacin -some herbal dietary supplements -steroid medicines such as prednisone or cortisone -sulfamethoxazole; trimethoprim -thyroid hormones Some medications can hide  the warning symptoms of low blood sugar (hypoglycemia). You may need to monitor your blood sugar more closely if you are taking one of these medications. These include: -beta-blockers, often used for high blood pressure or heart problems (examples include atenolol, metoprolol, propranolol) -clonidine -guanethidine -reserpine This list may not describe all possible interactions. Give your health care provider a list of all the medicines, herbs, non-prescription drugs, or dietary supplements you use. Also tell them if you smoke, drink alcohol, or use illegal drugs. Some items may interact with your medicine. What should I watch for while using this medicine? Visit your doctor or health care professional for regular checks on your progress. Drink plenty of  fluids while taking this medicine. Check with your doctor or health care professional if you get an attack of severe diarrhea, nausea, and vomiting. The loss of too much body fluid can make it dangerous for you to take this medicine. A test called the HbA1C (A1C) will be monitored. This is a simple blood test. It measures your blood sugar control over the last 2 to 3 months. You will receive this test every 3 to 6 months. Learn how to check your blood sugar. Learn the symptoms of low and high blood sugar and how to manage them. Always carry a quick-source of sugar with you in case you have symptoms of low blood sugar. Examples include hard sugar candy or glucose tablets. Make sure others know that you can choke if you eat or drink when you develop serious symptoms of low blood sugar, such as seizures or unconsciousness. They must get medical help at once. Tell your doctor or health care professional if you have high blood sugar. You might need to change the dose of your medicine. If you are sick or exercising more than usual, you might need to change the dose of your medicine. Do not skip meals. Ask your doctor or health care professional if you should avoid alcohol. Many nonprescription cough and cold products contain sugar or alcohol. These can affect blood sugar. Pens should never be shared. Even if the needle is changed, sharing may result in passing of viruses like hepatitis or HIV. Wear a medical ID bracelet or chain, and carry a card that describes your disease and details of your medicine and dosage times. What side effects may I notice from receiving this medicine? Side effects that you should report to your doctor or health care professional as soon as possible: -allergic reactions like skin rash, itching or hives, swelling of the face, lips, or tongue -breathing problems -diarrhea that continues or is severe -lump or swelling on the neck -severe nausea -signs and symptoms of infection like  fever or chills; cough; sore throat; pain or trouble passing urine -signs and symptoms of low blood sugar such as feeling anxious, confusion, dizziness, increased hunger, unusually weak or tired, sweating, shakiness, cold, irritable, headache, blurred vision, fast heartbeat, loss of consciousness -signs and symptoms of kidney injury like trouble passing urine or change in the amount of urine -trouble swallowing -unusual stomach upset or pain -vomiting Side effects that usually do not require medical attention (report to your doctor or health care professional if they continue or are bothersome): -constipation -decreased appetite -diarrhea -fatigue -headache -nausea -pain, redness, or irritation at site where injected -stomach upset -stuffy or runny nose This list may not describe all possible side effects. Call your doctor for medical advice about side effects. You may report side effects to FDA at 1-800-FDA-1088. Where should  I keep my medicine? Keep out of the reach of children. Store unopened pen in a refrigerator between 2 and 8 degrees C (36 and 46 degrees F). Do not freeze or use if the medicine has been frozen. Protect from light and excessive heat. After you first use the pen, it can be stored at room temperature between 15 and 30 degrees C (59 and 86 degrees F) or in a refrigerator. Throw away your used pen after 30 days or after the expiration date, whichever comes first. Do not store your pen with the needle attached. If the needle is left on, medicine may leak from the pen. NOTE: This sheet is a summary. It may not cover all possible information. If you have questions about this medicine, talk to your doctor, pharmacist, or health care provider.  2018 Elsevier/Gold Standard (2016-10-18 14:39:40)

## 2017-07-12 LAB — CBC WITH DIFFERENTIAL/PLATELET
BASOS ABS: 42 {cells}/uL (ref 0–200)
Basophils Relative: 0.8 %
EOS ABS: 130 {cells}/uL (ref 15–500)
Eosinophils Relative: 2.5 %
HCT: 34.9 % — ABNORMAL LOW (ref 35.0–45.0)
Hemoglobin: 11.3 g/dL — ABNORMAL LOW (ref 11.7–15.5)
Lymphs Abs: 1721 cells/uL (ref 850–3900)
MCH: 27 pg (ref 27.0–33.0)
MCHC: 32.4 g/dL (ref 32.0–36.0)
MCV: 83.3 fL (ref 80.0–100.0)
MONOS PCT: 8.3 %
MPV: 11.1 fL (ref 7.5–12.5)
NEUTROS PCT: 55.3 %
Neutro Abs: 2876 cells/uL (ref 1500–7800)
PLATELETS: 382 10*3/uL (ref 140–400)
RBC: 4.19 10*6/uL (ref 3.80–5.10)
RDW: 13.6 % (ref 11.0–15.0)
TOTAL LYMPHOCYTE: 33.1 %
WBC: 5.2 10*3/uL (ref 3.8–10.8)
WBCMIX: 432 {cells}/uL (ref 200–950)

## 2017-07-12 LAB — COMPREHENSIVE METABOLIC PANEL
AG Ratio: 1.3 (calc) (ref 1.0–2.5)
ALBUMIN MSPROF: 3.9 g/dL (ref 3.6–5.1)
ALKALINE PHOSPHATASE (APISO): 82 U/L (ref 33–115)
ALT: 19 U/L (ref 6–29)
AST: 16 U/L (ref 10–35)
BUN: 10 mg/dL (ref 7–25)
CALCIUM: 9.1 mg/dL (ref 8.6–10.2)
CO2: 26 mmol/L (ref 20–32)
CREATININE: 0.86 mg/dL (ref 0.50–1.10)
Chloride: 102 mmol/L (ref 98–110)
Globulin: 2.9 g/dL (calc) (ref 1.9–3.7)
Glucose, Bld: 226 mg/dL — ABNORMAL HIGH (ref 65–99)
POTASSIUM: 4.3 mmol/L (ref 3.5–5.3)
Sodium: 138 mmol/L (ref 135–146)
Total Bilirubin: 0.4 mg/dL (ref 0.2–1.2)
Total Protein: 6.8 g/dL (ref 6.1–8.1)

## 2017-07-12 LAB — LIPID PANEL
CHOLESTEROL: 177 mg/dL (ref <200)
HDL: 38 mg/dL — AB (ref >50)
LDL Cholesterol (Calc): 110 mg/dL (calc) — ABNORMAL HIGH
NON-HDL CHOLESTEROL (CALC): 139 mg/dL — AB (ref <130)
TRIGLYCERIDES: 173 mg/dL — AB (ref <150)
Total CHOL/HDL Ratio: 4.7 (calc) (ref <5.0)

## 2017-07-19 ENCOUNTER — Encounter: Payer: Self-pay | Admitting: Internal Medicine

## 2017-08-17 ENCOUNTER — Other Ambulatory Visit: Payer: Self-pay | Admitting: Medical

## 2017-08-26 ENCOUNTER — Telehealth: Payer: Self-pay | Admitting: Medical

## 2017-08-26 NOTE — Telephone Encounter (Signed)
[facility] [address] stating that they noticed that pt has not filled a statin therapy in the last 180 days. If it is appropriate for the pt to start statin therapy, please send in a new script.

## 2017-08-26 NOTE — Telephone Encounter (Signed)
Last visit she wanted to work on lifestyle changes.   Make sure she has f/u 33mo after last visit fasting

## 2017-08-27 NOTE — Telephone Encounter (Signed)
Called and spoke with pt she is working on her diet and she not taking her meds, she has an appt   Has 3 month follow up.pt doesn't need a refill.

## 2017-09-02 ENCOUNTER — Other Ambulatory Visit: Payer: Self-pay | Admitting: Medical

## 2017-09-02 MED ORDER — PRAVASTATIN SODIUM 20 MG PO TABS: 20.0000 mg | ORAL_TABLET | Freq: Every evening | ORAL | 0 refills | Status: DC

## 2017-10-17 ENCOUNTER — Other Ambulatory Visit: Payer: Self-pay | Admitting: Medical

## 2017-10-24 ENCOUNTER — Other Ambulatory Visit: Payer: Self-pay | Admitting: Medical

## 2017-10-30 ENCOUNTER — Ambulatory Visit: Payer: 59 | Admitting: Medical

## 2017-10-30 ENCOUNTER — Encounter: Payer: Self-pay | Admitting: Medical

## 2017-10-30 VITALS — BP 138/78 | HR 81 | Wt 234.4 lb

## 2017-10-30 DIAGNOSIS — I10 Essential (primary) hypertension: Secondary | ICD-10-CM | POA: Diagnosis not present

## 2017-10-30 DIAGNOSIS — E1169 Type 2 diabetes mellitus with other specified complication: Secondary | ICD-10-CM

## 2017-10-30 DIAGNOSIS — I809 Phlebitis and thrombophlebitis of unspecified site: Secondary | ICD-10-CM | POA: Diagnosis not present

## 2017-10-30 DIAGNOSIS — E119 Type 2 diabetes mellitus without complications: Secondary | ICD-10-CM

## 2017-10-30 DIAGNOSIS — Z2821 Immunization not carried out because of patient refusal: Secondary | ICD-10-CM

## 2017-10-30 DIAGNOSIS — E669 Obesity, unspecified: Secondary | ICD-10-CM

## 2017-10-30 DIAGNOSIS — W57XXXA Bitten or stung by nonvenomous insect and other nonvenomous arthropods, initial encounter: Secondary | ICD-10-CM | POA: Diagnosis not present

## 2017-10-30 DIAGNOSIS — E785 Hyperlipidemia, unspecified: Secondary | ICD-10-CM | POA: Diagnosis not present

## 2017-10-30 LAB — POCT GLYCOSYLATED HEMOGLOBIN (HGB A1C): Hemoglobin A1C: 10.2

## 2017-10-30 NOTE — Progress Notes (Signed)
Subjective: Chief Complaint  Patient presents with  . Diabetes    dm check , no other cocerns    Here for diabetes recheck  Diabetes - she is taking metformin XR 500mg  twice daily.   Checking glucose, this morning fasting 215, but running in the 200s regularly.   No foot concerns.   Is due to see eye doctor.    Hyperlipidemia-taking Pravachol 20mg  daily  Hypertension-taking Lisinopril 20mg  daily  Declines flu shot.  Declines pneumococcal vaccine.   Exercising - started back at the gym, but had a bursitis problem, saw orthopedist.  Using meloxicam currently.  Ortho just released her back to exercise.     Diet - improving.  She has a few small bumps on her upper back where she thinks she got bit by an insect a couple days ago.  They are itchy  She has an area on her right lower leg that is swollen and itchy and he has done this in the past a few times   Past Medical History:  Diagnosis Date  . Anxiety   . Chronic back pain   . Diabetes mellitus 2005  . Former smoker 02/2014  . Hypertension   . Obesity   . Tubal pregnancy   . Wears glasses    Current Outpatient Medications on File Prior to Visit  Medication Sig Dispense Refill  . lisinopril (PRINIVIL,ZESTRIL) 20 MG tablet TAKE 1 TABLET (20 MG TOTAL) BY MOUTH DAILY. 90 tablet 0  . metFORMIN (GLUCOPHAGE-XR) 500 MG 24 hr tablet TAKE 1 TABLET BY MOUTH TWICE A DAY 180 tablet 0  . pravastatin (PRAVACHOL) 20 MG tablet Take 1 tablet (20 mg total) every evening by mouth. 90 tablet 0  . triamcinolone ointment (KENALOG) 0.5 % APPLY TOPICALLY TWICE A DAY 30 g 0   No current facility-administered medications on file prior to visit.    ROS as in subjective    Objective: BP 140/82   Pulse 81   Wt 234 lb 6.4 oz (106.3 kg)   SpO2 99%   BMI 37.55 kg/m   Wt Readings from Last 3 Encounters:  10/30/17 234 lb 6.4 oz (106.3 kg)  07/11/17 242 lb 3.2 oz (109.9 kg)  01/18/17 241 lb (109.3 kg)   BP Readings from Last 3 Encounters:   10/30/17 140/82  07/11/17 132/80  01/18/17 (!) 150/72   General appearance: alert, no distress, WD/WN,  Oral cavity: MMM, no lesions Neck: supple, no lymphadenopathy, no thyromegaly, no masses Heart: RRR, normal S1, S2, no murmurs Lungs: CTA bilaterally, no wheezes, rhonchi, or rales Abdomen: +bs, soft, non tender, non distended, no masses, no hepatomegaly, no splenomegaly Pulses: 2+ symmetric, upper and lower extremities, normal cap refill Ext: no general edema Right distal lateral lower leg with small area of swelling and small knot subcutaneous suggesting phlebitis Left upper back with few small puffy pink lesions suggestive of insect bite, mild    Diabetic Foot Exam - Simple   Simple Foot Form Diabetic Foot exam was performed with the following findings:  Yes 10/30/2017  9:09 AM  Visual Inspection No deformities, no ulcerations, no other skin breakdown bilaterally:  Yes Sensation Testing Intact to touch and monofilament testing bilaterally:  Yes Pulse Check Posterior Tibialis and Dorsalis pulse intact bilaterally:  Yes Comments      Assessment: Encounter Diagnoses  Name Primary?  . Diabetes mellitus without complication (HCC) Yes  . Essential hypertension   . Diabetes mellitus type 2 in obese (HCC)   . Dyslipidemia   .  Influenza vaccination declined   . Pneumococcal vaccination declined   . Phlebitis   . Insect bite, initial encounter      Plan: Diabetes - labs today.   Discussed possible medication changes such as Jardiance, Ozempic, Glipizide.  C/t metformin for now.  C/t foot checks daily, yearly eye doctor visit.  HTN - c/t same medication  dyslipidemia - c/t statin, labs today  Phlebitis - mild, advised warm compresses, leg elevation, can use ASA 81mg  for a week, but call if not resolving. Discussed importance of routine exercise  Insect bite - mild, advised OTC benadryl oral, topical Cortaid for next 4-5 days.   F/u prn  We discussed the benefits of  influenza vaccine.  However, they decline influenza vaccine  We discussed the benefits of pneumococcal vaccine.  However, they decline pneumococcal vaccine.    [name] was seen today for diabetes.  Diagnoses and all orders for this visit:  Diabetes mellitus without complication (HCC) -     HgB A1c -     CBC -     Comprehensive metabolic panel -     [facility] DIABETES EYE EXAM -     [facility] DIABETES FOOT EXAM -     Lipid panel -     Microalbumin / creatinine urine ratio  Essential hypertension -     Comprehensive metabolic panel  Diabetes mellitus type 2 in obese (HCC)  Dyslipidemia -     Lipid panel  Influenza vaccination declined  Pneumococcal vaccination declined  Phlebitis  Insect bite, initial encounter

## 2017-10-31 ENCOUNTER — Other Ambulatory Visit: Payer: Self-pay | Admitting: Medical

## 2017-10-31 LAB — COMPREHENSIVE METABOLIC PANEL
A/G RATIO: 1.5 (ref 1.2–2.2)
ALBUMIN: 4.3 g/dL (ref 3.5–5.5)
ALT: 18 IU/L (ref 0–32)
AST: 12 IU/L (ref 0–40)
Alkaline Phosphatase: 103 IU/L (ref 39–117)
BUN / CREAT RATIO: 11 (ref 9–23)
BUN: 10 mg/dL (ref 6–24)
Bilirubin Total: 0.3 mg/dL (ref 0.0–1.2)
CALCIUM: 9.2 mg/dL (ref 8.7–10.2)
CO2: 21 mmol/L (ref 20–29)
Chloride: 100 mmol/L (ref 96–106)
Creatinine, Ser: 0.95 mg/dL (ref 0.57–1.00)
GFR, EST AFRICAN AMERICAN: 84 mL/min/{1.73_m2} (ref >59)
GFR, EST NON AFRICAN AMERICAN: 73 mL/min/{1.73_m2} (ref >59)
GLOBULIN, TOTAL: 2.8 g/dL (ref 1.5–4.5)
Glucose: 233 mg/dL — ABNORMAL HIGH (ref 65–99)
Potassium: 4.5 mmol/L (ref 3.5–5.2)
Sodium: 137 mmol/L (ref 134–144)
TOTAL PROTEIN: 7.1 g/dL (ref 6.0–8.5)

## 2017-10-31 LAB — CBC
HEMATOCRIT: 38.8 % (ref 34.0–46.6)
HEMOGLOBIN: 12.5 g/dL (ref 11.1–15.9)
MCH: 27.1 pg (ref 26.6–33.0)
MCHC: 32.2 g/dL (ref 31.5–35.7)
MCV: 84 fL (ref 79–97)
PLATELETS: 402 10*3/uL — AB (ref 150–379)
RBC: 4.62 x10E6/uL (ref 3.77–5.28)
RDW: 14.1 % (ref 12.3–15.4)
WBC: 4.6 10*3/uL (ref 3.4–10.8)

## 2017-10-31 LAB — MICROALBUMIN / CREATININE URINE RATIO
CREATININE, UR: 229.5 mg/dL
MICROALB/CREAT RATIO: 6.4 mg/g{creat} (ref 0.0–30.0)
Microalbumin, Urine: 14.8 ug/mL

## 2017-10-31 LAB — LIPID PANEL
CHOL/HDL RATIO: 6.1 ratio — AB (ref 0.0–4.4)
Cholesterol, Total: 190 mg/dL (ref 100–199)
HDL: 31 mg/dL — ABNORMAL LOW (ref >39)
LDL Calculated: 130 mg/dL — ABNORMAL HIGH (ref 0–99)
Triglycerides: 147 mg/dL (ref 0–149)
VLDL Cholesterol Cal: 29 mg/dL (ref 5–40)

## 2017-10-31 MED ORDER — EMPAGLIFLOZIN-METFORMIN HCL ER 10-1000 MG PO TB24: 2.0000 | ORAL_TABLET | Freq: Every day | ORAL | 3 refills | Status: DC

## 2017-10-31 MED ORDER — LISINOPRIL 20 MG PO TABS: ORAL_TABLET | ORAL | 3 refills | Status: DC

## 2017-10-31 MED ORDER — ROSUVASTATIN CALCIUM 20 MG PO TABS: 20.0000 mg | ORAL_TABLET | Freq: Every day | ORAL | 3 refills | Status: DC

## 2017-11-01 ENCOUNTER — Encounter: Payer: Self-pay | Admitting: Medical

## 2017-11-04 ENCOUNTER — Other Ambulatory Visit: Payer: Self-pay | Admitting: Medical

## 2017-11-04 MED ORDER — METFORMIN HCL ER 500 MG PO TB24: 500.0000 mg | ORAL_TABLET | Freq: Two times a day (BID) | ORAL | 0 refills | Status: DC

## 2017-11-04 MED ORDER — SEMAGLUTIDE(0.25 OR 0.5MG/DOS) 2 MG/1.5ML ~~LOC~~ SOPN: 0.2500 mg | PEN_INJECTOR | SUBCUTANEOUS | 1 refills | Status: DC

## 2017-12-20 ENCOUNTER — Other Ambulatory Visit: Payer: Self-pay | Admitting: Medical

## 2017-12-20 ENCOUNTER — Encounter: Payer: Self-pay | Admitting: Medical

## 2018-02-18 ENCOUNTER — Other Ambulatory Visit: Payer: Self-pay | Admitting: Medical

## 2018-02-18 NOTE — Telephone Encounter (Signed)
lmom asking patient to call back and let us know if she is still taking this medication.

## 2018-02-19 ENCOUNTER — Other Ambulatory Visit: Payer: Self-pay | Admitting: Medical

## 2018-02-19 ENCOUNTER — Encounter: Payer: Self-pay | Admitting: Medical

## 2018-02-19 MED ORDER — SEMAGLUTIDE(0.25 OR 0.5MG/DOS) 2 MG/1.5ML ~~LOC~~ SOPN: 0.2500 mg | PEN_INJECTOR | SUBCUTANEOUS | 0 refills | Status: DC

## 2018-02-19 MED ORDER — METFORMIN HCL ER 500 MG PO TB24: 500.0000 mg | ORAL_TABLET | Freq: Two times a day (BID) | ORAL | 0 refills | Status: DC

## 2018-03-09 ENCOUNTER — Telehealth: Payer: Self-pay | Admitting: Medical

## 2018-03-09 NOTE — Telephone Encounter (Signed)
P.A. OZEMPIC 

## 2018-03-13 NOTE — Telephone Encounter (Signed)
Recv'd notice back that no P.A. Required,  Called pharmacy and went thru for $0 co pay

## 2018-03-23 NOTE — Telephone Encounter (Signed)
done

## 2018-12-17 ENCOUNTER — Encounter (HOSPITAL_COMMUNITY): Payer: Self-pay

## 2018-12-17 ENCOUNTER — Emergency Department (HOSPITAL_COMMUNITY)
Admission: EM | Admit: 2018-12-17 | Discharge: 2018-12-17 | Disposition: A | Discharge status: Home / Self Care | Payer: 59 | Attending: Emergency Medicine | Admitting: Emergency Medicine

## 2018-12-17 ENCOUNTER — Other Ambulatory Visit: Payer: Self-pay

## 2018-12-17 ENCOUNTER — Emergency Department (HOSPITAL_COMMUNITY): Payer: 59

## 2018-12-17 DIAGNOSIS — Z79899 Other long term (current) drug therapy: Secondary | ICD-10-CM | POA: Insufficient documentation

## 2018-12-17 DIAGNOSIS — Z87891 Personal history of nicotine dependence: Secondary | ICD-10-CM | POA: Diagnosis not present

## 2018-12-17 DIAGNOSIS — R9389 Abnormal findings on diagnostic imaging of other specified body structures: Secondary | ICD-10-CM

## 2018-12-17 DIAGNOSIS — E119 Type 2 diabetes mellitus without complications: Secondary | ICD-10-CM | POA: Diagnosis not present

## 2018-12-17 DIAGNOSIS — I10 Essential (primary) hypertension: Secondary | ICD-10-CM | POA: Diagnosis not present

## 2018-12-17 DIAGNOSIS — R531 Weakness: Secondary | ICD-10-CM | POA: Diagnosis present

## 2018-12-17 DIAGNOSIS — Z7984 Long term (current) use of oral hypoglycemic drugs: Secondary | ICD-10-CM | POA: Insufficient documentation

## 2018-12-17 DIAGNOSIS — R935 Abnormal findings on diagnostic imaging of other abdominal regions, including retroperitoneum: Secondary | ICD-10-CM | POA: Diagnosis not present

## 2018-12-17 LAB — COMPREHENSIVE METABOLIC PANEL
ALT: 24 U/L (ref 0–44)
AST: 18 U/L (ref 15–41)
Albumin: 3.4 g/dL — ABNORMAL LOW (ref 3.5–5.0)
Alkaline Phosphatase: 92 U/L (ref 38–126)
Anion gap: 8 (ref 5–15)
BUN: 8 mg/dL (ref 6–20)
CO2: 25 mmol/L (ref 22–32)
Calcium: 8.9 mg/dL (ref 8.9–10.3)
Chloride: 104 mmol/L (ref 98–111)
Creatinine, Ser: 0.93 mg/dL (ref 0.44–1.00)
GFR calc Af Amer: >60 mL/min (ref >60)
GFR calc non Af Amer: >60 mL/min (ref >60)
Glucose, Bld: 211 mg/dL — ABNORMAL HIGH (ref 70–99)
POTASSIUM: 3.9 mmol/L (ref 3.5–5.1)
Sodium: 137 mmol/L (ref 135–145)
Total Bilirubin: 0.1 mg/dL — ABNORMAL LOW (ref 0.3–1.2)
Total Protein: 6.8 g/dL (ref 6.5–8.1)

## 2018-12-17 LAB — DIFFERENTIAL
Abs Immature Granulocytes: 0.01 10*3/uL (ref 0.00–0.07)
Basophils Absolute: 0 10*3/uL (ref 0.0–0.1)
Basophils Relative: 1 %
Eosinophils Absolute: 0.1 10*3/uL (ref 0.0–0.5)
Eosinophils Relative: 2 %
Immature Granulocytes: 0 %
Lymphocytes Relative: 29 %
Lymphs Abs: 1.7 10*3/uL (ref 0.7–4.0)
Monocytes Absolute: 0.5 10*3/uL (ref 0.1–1.0)
Monocytes Relative: 9 %
NEUTROS PCT: 59 %
Neutro Abs: 3.5 10*3/uL (ref 1.7–7.7)

## 2018-12-17 LAB — CBC
HCT: 38.5 % (ref 36.0–46.0)
Hemoglobin: 12.2 g/dL (ref 12.0–15.0)
MCH: 27.1 pg (ref 26.0–34.0)
MCHC: 31.7 g/dL (ref 30.0–36.0)
MCV: 85.6 fL (ref 80.0–100.0)
NRBC: 0 % (ref 0.0–0.2)
Platelets: 365 10*3/uL (ref 150–400)
RBC: 4.5 MIL/uL (ref 3.87–5.11)
RDW: 13.2 % (ref 11.5–15.5)
WBC: 5.8 10*3/uL (ref 4.0–10.5)

## 2018-12-17 LAB — CBG MONITORING, ED: Glucose-Capillary: 227 mg/dL — ABNORMAL HIGH (ref 70–99)

## 2018-12-17 LAB — URINALYSIS, ROUTINE W REFLEX MICROSCOPIC
Bilirubin Urine: NEGATIVE
Glucose, UA: >=500 mg/dL — AB
HGB URINE DIPSTICK: NEGATIVE
Ketones, ur: NEGATIVE mg/dL
LEUKOCYTE UA: NEGATIVE
Nitrite: NEGATIVE
Protein, ur: NEGATIVE mg/dL
Specific Gravity, Urine: 1.029 (ref 1.005–1.030)
pH: 6 (ref 5.0–8.0)

## 2018-12-17 LAB — I-STAT CREATININE, ED: Creatinine, Ser: 0.9 mg/dL (ref 0.44–1.00)

## 2018-12-17 LAB — RAPID URINE DRUG SCREEN, HOSP PERFORMED
Amphetamines: NOT DETECTED
BENZODIAZEPINES: NOT DETECTED
Barbiturates: NOT DETECTED
COCAINE: NOT DETECTED
Opiates: NOT DETECTED
Tetrahydrocannabinol: NOT DETECTED

## 2018-12-17 LAB — I-STAT BETA HCG BLOOD, ED (MC, WL, AP ONLY): I-stat hCG, quantitative: <5 m[IU]/mL (ref <5)

## 2018-12-17 LAB — APTT: APTT: 38 s — AB (ref 24–36)

## 2018-12-17 LAB — PROTIME-INR
INR: 1 (ref 0.8–1.2)
Prothrombin Time: 12.6 seconds (ref 11.4–15.2)

## 2018-12-17 LAB — ETHANOL: Alcohol, Ethyl (B): <10 mg/dL (ref <10)

## 2018-12-17 MED ORDER — MIDAZOLAM HCL 2 MG/2ML IJ SOLN
1.0000 mg | Freq: Once | INTRAMUSCULAR | Status: AC
  Administered 2018-12-17: 1 mg via INTRAVENOUS
  Filled 2018-12-17: qty 2

## 2018-12-17 NOTE — ED Notes (Signed)
Patient verbalizes understanding of discharge instructions. Opportunity for questioning and answers were provided. Armband removed by staff, pt discharged from ED.  

## 2018-12-17 NOTE — ED Triage Notes (Signed)
[facility] reports pt at work, with sudden onset left sided weakness and dizziness lasting approximately 3 minutes. 122/69 hr 90 99%RA cbg 303 recently back on metformin within last month.

## 2018-12-17 NOTE — ED Notes (Signed)
Pt remains at MRI

## 2018-12-17 NOTE — ED Provider Notes (Addendum)
[facility] EMERGENCY DEPARTMENT Provider Note   CSN: [id number] Arrival date & time: 12/17/18  1207    History   Chief Complaint Chief Complaint  Patient presents with  . Weakness  . Dizziness    HPI Kelsey Olson is a 48 y.o. female w/ h/o diabetes, HTN, HL, obesity, former tobacco use, here for evaluation of left-sided numbness.  Onset around 11 AM while she was sitting down at work.  It lasted 3 to 4 minutes.  Currently asymptomatic.  Describes first felt a "weird sensation" that she cannot describe, on the left hip that radiated down her left leg eventually up to her left arm and hand.  She then felt her left hand "cramp up" in a claw shape.  She felt like she could not relax it or control it.  She stood up and felt like her left leg felt heavier and weaker.  Associated with dizziness described as feeling like she was going to pass out.  Her speech and balance were intact during this episode.  She was at work and bystanders denied any facial drooping.  States in the last several days her doctor has noticed increased glucose levels and is supposed to start a second diabetes medicine.  Her fasting CBGs at home are in the 200s range.  She denies history of TIA/CVA.  She denies associated headache, vision changes or loss, nausea, vomiting, chest pain, shortness of breath, abdominal pain.  No illicit drug court EtOH use.  No interventions.  No alleviating or aggravating factors.    HPI  Past Medical History:  Diagnosis Date  . Anxiety   . Chronic back pain   . Diabetes mellitus 2005  . Former smoker 02/2014  . Hypertension   . Obesity   . Tubal pregnancy   . Wears glasses     Patient Active Problem List   Diagnosis Date Noted  . Diabetes mellitus type 2 in obese (HCC) 07/11/2017  . Dyslipidemia 07/11/2017  . Dermatitis 11/26/2016  . Flu-like symptoms 11/16/2016  . Body aches 11/16/2016  . Noncompliance 11/16/2016  . Influenza vaccination declined 12/06/2015    . Pneumococcal vaccination declined 12/06/2015  . Essential hypertension 10/01/2014  . Obesity 10/01/2014    Past Surgical History:  Procedure Laterality Date  . ECTOPIC PREGNANCY SURGERY  1999   right     OB History   No obstetric history on file.      Home Medications    Prior to Admission medications   Medication Sig Start Date End Date Taking? Authorizing Provider  lisinopril (PRINIVIL,ZESTRIL) 20 MG tablet TAKE 1 TABLET (20 MG TOTAL) BY MOUTH DAILY. 10/31/17   Tysinger, Kermit Balo, PA-C  metFORMIN (GLUCOPHAGE-XR) 500 MG 24 hr tablet Take 1 tablet (500 mg total) by mouth 2 (two) times daily. 02/19/18   Tysinger, Kermit Balo, PA-C  rosuvastatin (CRESTOR) 20 MG tablet Take 1 tablet (20 mg total) by mouth at bedtime. 10/31/17 10/31/18  Tysinger, Kermit Balo, PA-C  Semaglutide (OZEMPIC) 0.25 or 0.5 MG/DOSE SOPN Inject 0.25 mg into the skin once a week. 02/19/18   Tysinger, Kermit Balo, PA-C  triamcinolone ointment (KENALOG) 0.5 % APPLY TOPICALLY TWICE A DAY 10/25/17   Tysinger, Kermit Balo, PA-C    Family History Family History  Problem Relation Age of Onset  . Heart disease Mother        CHF  . Diabetes Mother   . Kidney disease Mother        dialysis  . COPD Father   .  Hepatitis C Father   . Cancer Father        colon  . Diabetes Sister   . Hypertension Sister   . Hypertension Brother   . Diabetes Brother   . Cancer Maternal Uncle        prostate  . Heart disease Maternal Grandmother   . Hypertension Brother     Social History Social History   Tobacco Use  . Smoking status: Former Smoker    Packs/day: 0.50    Years: 24.00    Pack years: 12.00  . Smokeless tobacco: Never Used  Substance Use Topics  . Alcohol use: No  . Drug use: No     Allergies   Percocet [oxycodone-acetaminophen]   Review of Systems Review of Systems  Neurological: Positive for dizziness, weakness and numbness.  All other systems reviewed and are negative.    Physical Exam Updated Vital  Signs BP (!) 154/61   Pulse 76   Temp 98.7 F (37.1 C) (Oral)   Resp 15   Ht  (1.651 m)   Wt 109.3 kg   LMP 11/24/2018 (Approximate)   SpO2 100%   BMI 40.10 kg/m   Physical Exam Vitals signs and nursing note reviewed.  Constitutional:      General: She is not in acute distress.    Appearance: She is well-developed.     Comments: NAD.  HENT:     Head: Normocephalic and atraumatic.     Right Ear: External ear normal.     Left Ear: External ear normal.     Nose: Nose normal.  Eyes:     General: No scleral icterus.    Conjunctiva/sclera: Conjunctivae normal.  Neck:     Musculoskeletal: Normal range of motion and neck supple.  Cardiovascular:     Rate and Rhythm: Normal rate and regular rhythm.     Heart sounds: Normal heart sounds. No murmur.  Pulmonary:     Effort: Pulmonary effort is normal.     Breath sounds: Normal breath sounds. No wheezing.  Musculoskeletal: Normal range of motion.        General: No deformity.  Skin:    General: Skin is warm and dry.     Capillary Refill: Capillary refill takes less than 2 seconds.  Neurological:     Mental Status: She is alert and oriented to person, place, and time.     Comments:  Alert and oriented to self, place, time and event.  Speech is fluent without dysarthria or dysphasia. Strength 5/5 with hand grip and ankle F/E.   Sensation to light touch intact in face, hands and feet. Normal gait No pronator drift. No leg drop. Normal finger-to-nose and feet tapping.  CN I not tested CN II grossly intact visual fields bilaterally. Unable to visualize posterior eye. CN III, IV, VI PEERL and EOMs intact bilaterally CN V light touch intact in all 3 divisions of trigeminal nerve CN VII facial movements symmetric CN VIII not tested CN IX, X no uvula deviation, symmetric rise of soft palate  CN XI 5/5 SCM and trapezius strength bilaterally  CN XII Midline tongue protrusion, symmetric L/R movements  Psychiatric:         Behavior: Behavior normal.        Thought Content: Thought content normal.        Judgment: Judgment normal.      ED Treatments / Results  Labs (all labs ordered are listed, but only abnormal results are displayed) Labs Reviewed  APTT - Abnormal; Notable for the following components:      Result Value   aPTT 38 (*)    All other components within normal limits  COMPREHENSIVE METABOLIC PANEL - Abnormal; Notable for the following components:   Glucose, Bld 211 (*)    Albumin 3.4 (*)    Total Bilirubin 0.1 (*)    All other components within normal limits  URINALYSIS, ROUTINE W REFLEX MICROSCOPIC - Abnormal; Notable for the following components:   Glucose, UA >=500 (*)    Bacteria, UA RARE (*)    All other components within normal limits  CBG MONITORING, ED - Abnormal; Notable for the following components:   Glucose-Capillary 227 (*)    All other components within normal limits  ETHANOL  PROTIME-INR  CBC  DIFFERENTIAL  RAPID URINE DRUG SCREEN, HOSP PERFORMED  I-STAT CREATININE, ED  I-STAT BETA HCG BLOOD, ED ([facility], [facility], [facility] ONLY)    EKG EKG Interpretation  Date/Time:  Wednesday December 17 2018 13:22:23 EST Ventricular Rate:  79 PR Interval:    QRS Duration: 84 QT Interval:  349 QTC Calculation: 400 R Axis:   57 Text Interpretation:  Sinus rhythm Borderline T wave abnormalities When compared to prior, no signifiacnt changes seen.  No STEMI Confirmed by Theda Belfast ([id number]) on 12/17/2018 2:31:56 PM   Radiology Ct Head Wo Contrast  Result Date: 12/17/2018 CLINICAL DATA:  Left-sided numbness EXAM: CT HEAD WITHOUT CONTRAST TECHNIQUE: Contiguous axial images were obtained from the base of the skull through the vertex without intravenous contrast. COMPARISON:  None. FINDINGS: Brain: No evidence of acute infarction, hemorrhage, hydrocephalus, extra-axial collection or mass lesion/mass effect. Vascular: No hyperdense vessel or unexpected calcification. Skull: Normal. Negative for  fracture or focal lesion. Sinuses/Orbits: No acute finding. Other: None. IMPRESSION: No acute intracranial pathology. Electronically Signed   By: Lauralyn Primes M.D.   On: 12/17/2018 15:19   Mr Brain Wo Contrast  Result Date: 12/17/2018 CLINICAL DATA:  Transient LEFT-sided weakness and dizziness. History of diabetes and hypertension. EXAM: MRI HEAD WITHOUT CONTRAST TECHNIQUE: Multiplanar, multiecho pulse sequences of the brain and surrounding structures were obtained without intravenous contrast. COMPARISON:  CT HEAD December 17, 2018 FINDINGS: INTRACRANIAL CONTENTS: No reduced diffusion to suggest acute ischemia or hyperacute demyelination. No susceptibility artifact to suggest hemorrhage. The ventricles and sulci are normal for patient's age. Three nonspecific scattered subcentimeter supratentorial white matter FLAIR T2 hyperintensities including LEFT periatrial white matter. No suspicious parenchymal signal, masses, mass effect. No abnormal intraparenchymal or extra-axial enhancement. No abnormal extra-axial fluid collections. No extra-axial masses. VASCULAR: Normal major intracranial vascular flow voids present at skull base. SKULL AND UPPER CERVICAL SPINE: No abnormal sellar expansion. No suspicious calvarial bone marrow signal. Craniocervical junction maintained. SINUSES/ORBITS: The mastoid air-cells and included paranasal sinuses are well-aerated.The included ocular globes and orbital contents are non-suspicious. OTHER: None. IMPRESSION: 1. No acute intracranial process. 2. A few white matter lesions most commonly seen with chronic small vessel ischemic changes; solitary potential demyelinating lesion. Recommend follow-up MRI of the brain with contrast. Alternatively, consider six-month follow-up MRI of the head to verify stability of findings. Electronically Signed   By: Awilda Metro M.D.   On: 12/17/2018 18:51    Procedures Procedures (including critical care time)  Medications Ordered in  ED Medications  midazolam (VERSED) injection 1 mg (1 mg Intravenous Given 12/17/18 1736)     Initial Impression / Assessment and Plan / ED Course  I have reviewed the triage vital signs and the nursing notes.  Pertinent labs & imaging results that were available during my care of the patient were reviewed by me and considered in my medical decision making (see chart for details).  Clinical Course as of Dec 16 2124  Wed Dec 17, 2018  1303 Spoke to Dr Aroor regarding atypical presentation, recommends MRI wo brain. If negative can f/u with neuro as OP.   [CG]  1902 IMPRESSION: 1. No acute intracranial process. 2. A few white matter lesions most commonly seen with chronic small vessel ischemic changes; solitary potential demyelinating lesion.  Recommend follow-up MRI of the brain with contrast. Alternatively, consider six-month follow-up MRI of the head to verify stability of findings.  MR BRAIN WO CONTRAST [CG]    Clinical Course User Index [CG] Liberty Handy, PA-C        Ddx includes hyperglycemia related vs peripheral neuropathy vs focal seizure vs CVA/TIA.  She has no HA, vision changes, neck stiffness/meningismus, CP/SOB or back pain associated with this.  Unlikely to be meningitis, cauda equina, dissection, ACS.  Pending labs, CT.  MRI recommended by Dr Laurence Slate.   2118: Work-up remarkable for glucosuria.  Hyperglycemia with out signs of DKA.  CT head is unremarkable.  Follow-up MRI was obtained that showed signs of ischemic changes and solitary potential demyelinating lesion.  I spoke to Dr. Amada Jupiter about this finding who reviewed MRI, deems patient appropriate for discharge with close neurology follow-up for possible EEG and MRI with contrast.  Baby aspirin daily for ischemic changes on MRI.  Discussed plan and results with patient.  States she has chronic left hip pain and intermittent "twitching" in bilateral hands that is chronic, but no other MS type symptoms. No h/o seizures  in the past. Discussed return precautions.  She is to return to the ER for return of symptoms or new symptoms.  Consider MRI with contrast to better visualize the lesion.  Final Clinical Impressions(s) / ED Diagnoses   Final diagnoses:  Abnormal MRI    ED Discharge Orders    None         Jerrell Mylar 12/17/18 2127    Tegeler, Canary Brim, MD 12/18/18 [phone]

## 2018-12-17 NOTE — Discharge Instructions (Signed)
You were seen in the ER for tingling, numbness to left hip, leg, arm and clenching/cramping of your left hand, dizziness.   Labs were normal.   MRI showed possible abnormal small lesion and plaque build up.  I spoke to Dr Amada Jupiter (neurologist) who reviewed your MRI.  He recommends follow up with neurology for further discussion of your MRI findings and symptoms, and to determine needed work up as outpatient.   Return to the ER if there is new or return of symptoms, headaches, vision changes, nausea, vomiting, speech or balance issues, facial drooping, loss of sensation or strength in your extremities  Start taking aspirin 81 mg daily  MRI 12/17/2018 IMPRESSION: 1. No acute intracranial process. 2. A few white matter lesions most commonly seen with chronic small vessel ischemic changes; solitary potential demyelinating lesion.  Recommend follow-up MRI of the brain with contrast. Alternatively, consider six-month follow-up MRI of the head to verify stability of findings.

## 2020-12-27 ENCOUNTER — Emergency Department (HOSPITAL_COMMUNITY): Payer: Medicaid Other

## 2020-12-27 ENCOUNTER — Other Ambulatory Visit: Payer: Self-pay

## 2020-12-27 ENCOUNTER — Emergency Department (HOSPITAL_COMMUNITY)
Admission: EM | Admit: 2020-12-27 | Discharge: 2020-12-27 | Disposition: A | Discharge status: Home / Self Care | Payer: Medicaid Other | Attending: Emergency Medicine | Admitting: Emergency Medicine

## 2020-12-27 ENCOUNTER — Encounter (HOSPITAL_COMMUNITY): Payer: Self-pay

## 2020-12-27 DIAGNOSIS — Z87891 Personal history of nicotine dependence: Secondary | ICD-10-CM | POA: Diagnosis not present

## 2020-12-27 DIAGNOSIS — Z7984 Long term (current) use of oral hypoglycemic drugs: Secondary | ICD-10-CM | POA: Diagnosis not present

## 2020-12-27 DIAGNOSIS — R000 Tachycardia, unspecified: Secondary | ICD-10-CM | POA: Diagnosis not present

## 2020-12-27 DIAGNOSIS — I10 Essential (primary) hypertension: Secondary | ICD-10-CM | POA: Insufficient documentation

## 2020-12-27 DIAGNOSIS — Z794 Long term (current) use of insulin: Secondary | ICD-10-CM | POA: Insufficient documentation

## 2020-12-27 DIAGNOSIS — R42 Dizziness and giddiness: Secondary | ICD-10-CM | POA: Diagnosis not present

## 2020-12-27 DIAGNOSIS — Z79899 Other long term (current) drug therapy: Secondary | ICD-10-CM | POA: Insufficient documentation

## 2020-12-27 DIAGNOSIS — R55 Syncope and collapse: Secondary | ICD-10-CM | POA: Insufficient documentation

## 2020-12-27 DIAGNOSIS — R61 Generalized hyperhidrosis: Secondary | ICD-10-CM | POA: Insufficient documentation

## 2020-12-27 DIAGNOSIS — E119 Type 2 diabetes mellitus without complications: Secondary | ICD-10-CM | POA: Insufficient documentation

## 2020-12-27 DIAGNOSIS — R112 Nausea with vomiting, unspecified: Secondary | ICD-10-CM | POA: Insufficient documentation

## 2020-12-27 LAB — CBC WITH DIFFERENTIAL/PLATELET
Abs Immature Granulocytes: 0.01 10*3/uL (ref 0.00–0.07)
Basophils Absolute: 0 10*3/uL (ref 0.0–0.1)
Basophils Relative: 1 %
Eosinophils Absolute: 0.1 10*3/uL (ref 0.0–0.5)
Eosinophils Relative: 2 %
HCT: 44.2 % (ref 36.0–46.0)
Hemoglobin: 14.1 g/dL (ref 12.0–15.0)
Immature Granulocytes: 0 %
Lymphocytes Relative: 34 %
Lymphs Abs: 2.8 10*3/uL (ref 0.7–4.0)
MCH: 26.7 pg (ref 26.0–34.0)
MCHC: 31.9 g/dL (ref 30.0–36.0)
MCV: 83.7 fL (ref 80.0–100.0)
Monocytes Absolute: 0.7 10*3/uL (ref 0.1–1.0)
Monocytes Relative: 8 %
Neutro Abs: 4.4 10*3/uL (ref 1.7–7.7)
Neutrophils Relative %: 55 %
Platelets: 401 10*3/uL — ABNORMAL HIGH (ref 150–400)
RBC: 5.28 MIL/uL — ABNORMAL HIGH (ref 3.87–5.11)
RDW: 13.2 % (ref 11.5–15.5)
WBC: 8 10*3/uL (ref 4.0–10.5)
nRBC: 0 % (ref 0.0–0.2)

## 2020-12-27 LAB — COMPREHENSIVE METABOLIC PANEL
ALT: 20 U/L (ref 0–44)
AST: 50 U/L — ABNORMAL HIGH (ref 15–41)
Albumin: 3.7 g/dL (ref 3.5–5.0)
Alkaline Phosphatase: 93 U/L (ref 38–126)
Anion gap: 11 (ref 5–15)
BUN: 16 mg/dL (ref 6–20)
CO2: 23 mmol/L (ref 22–32)
Calcium: 9.3 mg/dL (ref 8.9–10.3)
Chloride: 97 mmol/L — ABNORMAL LOW (ref 98–111)
Creatinine, Ser: 1.08 mg/dL — ABNORMAL HIGH (ref 0.44–1.00)
GFR, Estimated: >60 mL/min (ref >60)
Glucose, Bld: 287 mg/dL — ABNORMAL HIGH (ref 70–99)
Potassium: 5.9 mmol/L — ABNORMAL HIGH (ref 3.5–5.1)
Sodium: 131 mmol/L — ABNORMAL LOW (ref 135–145)
Total Bilirubin: 1.6 mg/dL — ABNORMAL HIGH (ref 0.3–1.2)
Total Protein: 7.8 g/dL (ref 6.5–8.1)

## 2020-12-27 LAB — URINALYSIS, ROUTINE W REFLEX MICROSCOPIC
Bilirubin Urine: NEGATIVE
Glucose, UA: >=500 mg/dL — AB
Ketones, ur: 5 mg/dL — AB
Leukocytes,Ua: NEGATIVE
Nitrite: NEGATIVE
Protein, ur: NEGATIVE mg/dL
Specific Gravity, Urine: 1.016 (ref 1.005–1.030)
pH: 5 (ref 5.0–8.0)

## 2020-12-27 LAB — TROPONIN I (HIGH SENSITIVITY)
Troponin I (High Sensitivity): 13 ng/L (ref <18)
Troponin I (High Sensitivity): 14 ng/L (ref <18)

## 2020-12-27 LAB — POTASSIUM: Potassium: 3.6 mmol/L (ref 3.5–5.1)

## 2020-12-27 LAB — CBG MONITORING, ED: Glucose-Capillary: 287 mg/dL — ABNORMAL HIGH (ref 70–99)

## 2020-12-27 MED ORDER — SODIUM CHLORIDE 0.9 % IV BOLUS
1000.0000 mL | Freq: Once | INTRAVENOUS | Status: AC
  Administered 2020-12-27: 1000 mL via INTRAVENOUS

## 2020-12-27 NOTE — Discharge Instructions (Addendum)
Please continue hydrating and eating a well-balanced diet, limit carbs and simple sugars to help with blood sugar. Follow with your PCP regarding today's visit. Return for recurrent or worsening symptoms.

## 2020-12-27 NOTE — ED Notes (Signed)
Pt transported to xray 

## 2020-12-27 NOTE — ED Notes (Signed)
Pt d/c home per MD order. Discharge summary reviewed with pt, pt verbalizes understanding. Ambulatory off unit. No s/s of acute distress noted. Discharged home with spouse.

## 2020-12-27 NOTE — ED Provider Notes (Signed)
[facility] EMERGENCY DEPARTMENT Provider Note   CSN: [id number] Arrival date & time: 12/27/20  0731     History Chief Complaint  Patient presents with  . Dizziness  . Nausea  . Hypertension    Started at 0620 this am    Kelsey Olson is a 50 y.o. female past medical history of hypertension, diabetes, chronic back pain, anxiety, presenting to the emergency department with complaint of near syncopal episode.  She states she was in the shower this morning and soon after she got out she began feeling very nauseous with some diaphoresis.  She felt lightheaded though did not lose consciousness.  She drank some water to help her symptoms which she vomited up.  She also had a bowel movement.  She checked her blood pressure at the time and it was about 160/80.  She called EMS for transfer to the ED.  Denies any chest pain or shortness of breath, headache, vision changes, numbness.  States over the weekend she was struggling with high blood sugars which is unusual for her.  She started a new blood pressure medication, switched from lisinopril to lisinopril-HCTZ on Friday.  States she has been diuresing and has noticed that her fluid weight is decreasing.  This is been making her feel better, she was able to walk the neighborhood without dyspnea.  States she has not been eating a whole lot over the weekend due to her high blood sugars, was unsure what to eat and did not want to worsen her hyperglycemia.  States her meals yesterday included a cheeseburger, apples with peanut butter, egg.  She has not eaten anything yet today, she has not taken her blood pressure medication yet today.  About a week ago may have had symptoms of UTI, treated with cranberry juice and seemed to resolve.  The history is provided by the patient.       Past Medical History:  Diagnosis Date  . Anxiety   . Chronic back pain   . Diabetes mellitus 2005  . Former smoker 02/2014  . Hypertension   .  Obesity   . Tubal pregnancy   . Wears glasses     Patient Active Problem List   Diagnosis Date Noted  . Diabetes mellitus type 2 in obese (HCC) 07/11/2017  . Dyslipidemia 07/11/2017  . Dermatitis 11/26/2016  . Flu-like symptoms 11/16/2016  . Body aches 11/16/2016  . Noncompliance 11/16/2016  . Influenza vaccination declined 12/06/2015  . Pneumococcal vaccination declined 12/06/2015  . Essential hypertension 10/01/2014  . Obesity 10/01/2014    Past Surgical History:  Procedure Laterality Date  . ECTOPIC PREGNANCY SURGERY  1999   right     OB History   No obstetric history on file.     Family History  Problem Relation Age of Onset  . Heart disease Mother        CHF  . Diabetes Mother   . Kidney disease Mother        dialysis  . COPD Father   . Hepatitis C Father   . Cancer Father        colon  . Diabetes Sister   . Hypertension Sister   . Hypertension Brother   . Diabetes Brother   . Cancer Maternal Uncle        prostate  . Heart disease Maternal Grandmother   . Hypertension Brother     Social History   Tobacco Use  . Smoking status: Former Smoker    Packs/day:  0.50    Years: 24.00    Pack years: 12.00  . Smokeless tobacco: Never Used  Substance Use Topics  . Alcohol use: No  . Drug use: No    Home Medications Prior to Admission medications   Medication Sig Start Date End Date Taking? Authorizing Provider  lisinopril (PRINIVIL,ZESTRIL) 20 MG tablet TAKE 1 TABLET (20 MG TOTAL) BY MOUTH DAILY. 10/31/17   Tysinger, Kermit Balo, PA-C  metFORMIN (GLUCOPHAGE-XR) 500 MG 24 hr tablet Take 1 tablet (500 mg total) by mouth 2 (two) times daily. 02/19/18   Tysinger, Kermit Balo, PA-C  rosuvastatin (CRESTOR) 20 MG tablet Take 1 tablet (20 mg total) by mouth at bedtime. 10/31/17 10/31/18  Tysinger, Kermit Balo, PA-C  Semaglutide (OZEMPIC) 0.25 or 0.5 MG/DOSE SOPN Inject 0.25 mg into the skin once a week. 02/19/18   Tysinger, Kermit Balo, PA-C  triamcinolone ointment (KENALOG) 0.5 %  APPLY TOPICALLY TWICE A DAY 10/25/17   Tysinger, Kermit Balo, PA-C    Allergies    Percocet [oxycodone-acetaminophen]  Review of Systems   Review of Systems  Constitutional: Positive for diaphoresis. Negative for fever.  Respiratory: Negative for shortness of breath.   Cardiovascular: Negative for chest pain and leg swelling.  Gastrointestinal: Positive for nausea and vomiting.  Neurological: Positive for light-headedness.  All other systems reviewed and are negative.   Physical Exam Updated Vital Signs BP 111/68   Pulse (!) 101   Temp 98 F (36.7 C) (Oral)   Resp 15   Ht 5\' 5"  (1.651 m)   Wt 104.3 kg   SpO2 100%   BMI 38.27 kg/m   Physical Exam Vitals and nursing note reviewed.  Constitutional:      General: She is not in acute distress.    Appearance: She is well-developed. She is not ill-appearing.  HENT:     Head: Normocephalic and atraumatic.  Eyes:     Conjunctiva/sclera: Conjunctivae normal.  Neck:     Vascular: No JVD.  Cardiovascular:     Rate and Rhythm: Tachycardia present.  Pulmonary:     Effort: Pulmonary effort is normal. No respiratory distress.     Breath sounds: Normal breath sounds.  Abdominal:     General: Bowel sounds are normal.     Palpations: Abdomen is soft.     Tenderness: There is no abdominal tenderness. There is no guarding or rebound.  Musculoskeletal:     Right lower leg: No edema.     Left lower leg: No edema.  Skin:    General: Skin is warm.  Neurological:     Mental Status: She is alert.  Psychiatric:        Behavior: Behavior normal.     ED Results / Procedures / Treatments   Labs (all labs ordered are listed, but only abnormal results are displayed) Labs Reviewed  COMPREHENSIVE METABOLIC PANEL - Abnormal; Notable for the following components:      Result Value   Sodium 131 (*)    Potassium 5.9 (*)    Chloride 97 (*)    Glucose, Bld 287 (*)    Creatinine, Ser 1.08 (*)    AST 50 (*)    Total Bilirubin 1.6 (*)    All  other components within normal limits  CBC WITH DIFFERENTIAL/PLATELET - Abnormal; Notable for the following components:   RBC 5.28 (*)    Platelets 401 (*)    All other components within normal limits  URINALYSIS, ROUTINE W REFLEX MICROSCOPIC - Abnormal; Notable for the  following components:   Glucose, UA >=500 (*)    Hgb urine dipstick LARGE (*)    Ketones, ur 5 (*)    Bacteria, UA RARE (*)    All other components within normal limits  CBG MONITORING, ED - Abnormal; Notable for the following components:   Glucose-Capillary 287 (*)    All other components within normal limits  POTASSIUM  TROPONIN I (HIGH SENSITIVITY)  TROPONIN I (HIGH SENSITIVITY)    EKG EKG Interpretation  Date/Time:  Tuesday December 27 2020 07:40:22 EDT Ventricular Rate:  109 PR Interval:    QRS Duration: 83 QT Interval:  332 QTC Calculation: 447 R Axis:   29 Text Interpretation: Sinus tachycardia Probable left atrial enlargement Consider anterior infarct Minimal ST depression Nonspecific T wave abnormality compared with prior 3/20 Confirmed by Meridee ScoreButler, Michael [phone]([id number]) on 12/27/2020 7:47:30 AM Also confirmed by Meridee ScoreButler, Michael [phone]), editor SummerlandSimpson, Tamera PuntMiranda ([phone])  on 12/27/2020 11:10:57 AM   Radiology DG Chest 2 View  Result Date: 12/27/2020 CLINICAL DATA:  Dizziness. EXAM: CHEST - 2 VIEW COMPARISON:  November 16, 2016. FINDINGS: The heart size and mediastinal contours are within normal limits. Both lungs are clear. The visualized skeletal structures are unremarkable. IMPRESSION: No active cardiopulmonary disease. Electronically Signed   By: Lupita RaiderJames  Green Jr M.D.   On: 12/27/2020 08:19    Procedures Procedures   Medications Ordered in ED Medications  sodium chloride 0.9 % bolus 1,000 mL (0 mLs Intravenous Stopped 12/27/20 1102)    ED Course  I have reviewed the triage vital signs and the nursing notes.  Pertinent labs & imaging results that were available during my care of the patient were reviewed by  me and considered in my medical decision making (see chart for details).  Clinical Course as of 12/27/20 1237  Tue Dec 27, 2020  1207 Patient reevaluated, states she has not had any recurrence of symptoms.  She ambulated to the restroom without recurrence of lightheadedness.  Discussed reassuring work-up today.  Instructions to follow closely with PCP and return should symptoms worsen in any way. [JR]    Clinical Course User Index [JR] Robinson, [place] N, PA-C   MDM Rules/Calculators/A&P                          Patient presents to the ED for near syncopal episode that seems to be vaso-vagal in nature.  States she just got out of the hot shower and began feeling lightheaded, sweaty and nauseous.  She had near syncope though did not syncopized.  She had episode of vomiting.  Denies any chest pain or shortness of breath.  States over the weekend she has not been feeling the best, recently changed blood pressure medicine from lisinopril to lisinopril-HCTZ and has been urinating quite a bit.  States she has also been limiting her p.o. intake due to high blood sugar levels.  On examination she is in no acute distress, well-appearing.  Slightly tachycardic on initial examination though improved with IV fluids.  Blood pressure slightly high to normotensive.  ECG shows some nonspecific T wave changes in V5 and 6, which were reviewed by attending physician Dr. Charm BargesButler.  Troponins x2 are negative and flat.  Metabolic panel with creatinine at baseline, hyperglycemia with normal gap and bicarb.  Initially potassium was read as high on metabolic panel though the lab reports this appears to be slightly hemolyzed.  Recheck was within normal limits at 3.6. U/A without infection though with  blood, patient is currently menstruating.  Patient is hydrated with IV fluids.  She is ambulating in the ED without recurrence of lightheadedness.  Discussed importance of outpatient follow-up with PCP.  Also recommend she discuss  her new blood pressure medication and diuretic as this potentially may contribute to her symptoms today.   Workup and care plan discussed with attending physician Dr Charm Barges who is in agreement with discharge.  Final Clinical Impression(s) / ED Diagnoses Final diagnoses:  Near syncope    Rx / DC Orders ED Discharge Orders    None       Robinson, [place] N, PA-C 12/27/20 1237    Terrilee Files, MD 12/27/20 1758

## 2021-08-08 ENCOUNTER — Encounter: Payer: Self-pay | Admitting: Nurse Practitioner

## 2021-08-09 ENCOUNTER — Ambulatory Visit: Payer: Managed Care, Other (non HMO) | Admitting: Nurse Practitioner

## 2021-08-09 ENCOUNTER — Other Ambulatory Visit: Payer: Self-pay

## 2021-08-09 ENCOUNTER — Encounter: Payer: Self-pay | Admitting: Nurse Practitioner

## 2021-08-09 VITALS — BP 130/82 | HR 98 | Temp 98.0°F | Ht 65.0 in | Wt 241.0 lb

## 2021-08-09 DIAGNOSIS — R140 Abdominal distension (gaseous): Secondary | ICD-10-CM

## 2021-08-09 DIAGNOSIS — E1169 Type 2 diabetes mellitus with other specified complication: Secondary | ICD-10-CM

## 2021-08-09 DIAGNOSIS — G8929 Other chronic pain: Secondary | ICD-10-CM

## 2021-08-09 DIAGNOSIS — Z1159 Encounter for screening for other viral diseases: Secondary | ICD-10-CM

## 2021-08-09 DIAGNOSIS — I10 Essential (primary) hypertension: Secondary | ICD-10-CM | POA: Diagnosis not present

## 2021-08-09 DIAGNOSIS — E669 Obesity, unspecified: Secondary | ICD-10-CM | POA: Diagnosis not present

## 2021-08-09 DIAGNOSIS — E785 Hyperlipidemia, unspecified: Secondary | ICD-10-CM | POA: Diagnosis not present

## 2021-08-09 DIAGNOSIS — M545 Low back pain, unspecified: Secondary | ICD-10-CM

## 2021-08-09 DIAGNOSIS — Z7689 Persons encountering health services in other specified circumstances: Secondary | ICD-10-CM

## 2021-08-09 DIAGNOSIS — Z79899 Other long term (current) drug therapy: Secondary | ICD-10-CM

## 2021-08-09 LAB — POCT URINALYSIS DIPSTICK
Bilirubin, UA: NEGATIVE
Blood, UA: NEGATIVE
Glucose, UA: POSITIVE — AB
Ketones, UA: NEGATIVE
Leukocytes, UA: NEGATIVE
Nitrite, UA: NEGATIVE
Protein, UA: NEGATIVE
Spec Grav, UA: 1.025 (ref 1.010–1.025)
Urobilinogen, UA: 0.2 E.U./dL
pH, UA: 5 (ref 5.0–8.0)

## 2021-08-09 LAB — POCT UA - MICROALBUMIN
Albumin/Creatinine Ratio, Urine, POC: <30
Creatinine, POC: 300 mg/dL
Microalbumin Ur, POC: 10 mg/L

## 2021-08-09 MED ORDER — DAPAGLIFLOZIN PROPANEDIOL 10 MG PO TABS
10.0000 mg | ORAL_TABLET | Freq: Every day | ORAL | 2 refills | Status: DC
Start: 2021-08-09 — End: 2021-11-13

## 2021-08-09 NOTE — Patient Instructions (Signed)

## 2021-08-09 NOTE — Progress Notes (Signed)
I,Victoria Hamilton,acting as a Education administrator for Minette Brine, FNP.,have documented all relevant documentation on the behalf of Minette Brine, FNP,as directed by  Minette Brine, FNP while in the presence of Minette Brine, [place].  This visit occurred during the SARS-CoV-2 public health emergency.  Safety protocols were in place, including screening questions prior to the visit, additional usage of staff PPE, and extensive cleaning of exam room while observing appropriate contact time as indicated for disinfecting solutions.  Subjective:     Patient ID: Kelsey Olson , female    DOB: [birth year] , 50 y.o.   MRN: [id number]   Chief Complaint  Patient presents with   Establish Care     HPI  Pt presents today to establish care. Previous PCP Dr. Tarry Kos. [place], practice recently became out of network for her insurance, has been without PCP for 3 months. She was last seen by him in April. She works in Therapist, art.  Married. She has a 29 y/o (son), 43 y/o (son) and 62 y/o (daughter) - hypertension.  Her 83 y/o son has depression, hypertension.    PMH - hypertension (2006), diabetes (she had gestational diabetes in 1993, dx with diabetes in 2004) before she left alpha was taking metformin and farxiga (due to no insurance she was started on glipizide) - she tries to limit your bread and pastas and walks, knows what her triggers are. Ozempic - she reports her A1cs did not improve much - about 4-5 years ago. She has not seen a diabetic educator or nutritionist since having gestational diabetes.   She experiences a little pain rated 2/10 near her ribcage on the left side.  She currently does not have a GYN, having new insurance she is having to find new providers at the moment.  Pt would like to touch on specifically today wanting to change glipizide, also hip & back pain that she has had for a while, from an accident in 2013. She has went to an orthopedic after the accident, he did state that she had  inflammation in her left hip. She was previously on meloxicam for pain, she felt it was not working at the time so she was given 800MG ibuprofen.   Back Pain The current episode started more than 1 year ago. The problem occurs intermittently. The pain is present in the lumbar spine and thoracic spine. Pertinent negatives include no abdominal pain, headaches, numbness or weakness. Risk factors include obesity, Olson of exercise and sedentary lifestyle. She has tried analgesics for the symptoms.    Past Medical History:  Diagnosis Date   Chronic back pain    Diabetes mellitus 10/16/2003   Former smoker 02/12/2014   Hypertension    Obesity    Tubal pregnancy    Wears glasses      Family History  Problem Relation Age of Onset   Heart disease Mother        CHF   Diabetes Mother    Kidney disease Mother        dialysis   COPD Father    Hepatitis C Father    Cancer Father        colon   Diabetes Sister    Hypertension Sister    Hypertension Brother    Diabetes Brother    Cancer Maternal Uncle        prostate   Heart disease Maternal Grandmother    Hypertension Brother      Current Outpatient Medications:    cholecalciferol (VITAMIN D3) 25  MCG (1000 UNIT) tablet, Take 1,000 Units by mouth daily., Disp: , Rfl:    dapagliflozin propanediol (FARXIGA) 10 MG TABS tablet, Take 1 tablet (10 mg total) by mouth daily., Disp: 30 tablet, Rfl: 2   metFORMIN (GLUCOPHAGE-XR) 500 MG 24 hr tablet, Take 1 tablet (500 mg total) by mouth 2 (two) times daily., Disp: 180 tablet, Rfl: 0   atorvastatin (LIPITOR) 20 MG tablet, Take 20 mg by mouth at bedtime., Disp: , Rfl:    glipiZIDE (GLUCOTROL) 5 MG tablet, Take 5 mg by mouth 2 (two) times daily., Disp: , Rfl:    lisinopril-hydrochlorothiazide (ZESTORETIC) 20-12.5 MG tablet, Take 1 tablet by mouth 2 (two) times daily., Disp: , Rfl:    Semaglutide (OZEMPIC) 0.25 or 0.5 MG/DOSE SOPN, Inject 0.25 mg into the skin once a week., Disp: 1.5 mL, Rfl: 0    triamcinolone ointment (KENALOG) 0.5 %, APPLY TOPICALLY TWICE A DAY (Patient not taking: Reported on 08/09/2021), Disp: 30 g, Rfl: 0   Allergies  Allergen Reactions   Percocet [Oxycodone-Acetaminophen] Itching and Other (See Comments)    Hallucinations     Review of Systems  Constitutional: Negative.   HENT: Negative.    Respiratory: Negative.    Cardiovascular: Negative.   Gastrointestinal: Negative.  Negative for abdominal pain.  Musculoskeletal:  Positive for back pain.  Neurological: Negative.  Negative for weakness, numbness and headaches.  Psychiatric/Behavioral: Negative.      Today's Vitals   08/09/21 0908  BP: 130/82  Pulse: 98  Temp: 98 F (36.7 C)  Weight: 241 lb (109.3 kg)  Height: _0  (1.651 m)  PainSc: 2    Body mass index is 40.1 kg/m.  Wt Readings from Last 3 Encounters:  08/09/21 241 lb (109.3 kg)  12/27/20 230 lb (104.3 kg)  12/17/18 241 lb (109.3 kg)    Objective:  Physical Exam Vitals reviewed.  Constitutional:      General: She is not in acute distress.    Appearance: Normal appearance. She is well-developed. She is obese.  Cardiovascular:     Rate and Rhythm: Normal rate and regular rhythm.     Pulses: Normal pulses.     Heart sounds: Normal heart sounds. No murmur heard. Pulmonary:     Effort: Pulmonary effort is normal. No respiratory distress.     Breath sounds: Normal breath sounds. No wheezing.  Chest:     Chest wall: No tenderness.  Musculoskeletal:        General: Normal range of motion.  Skin:    General: Skin is warm and dry.     Capillary Refill: Capillary refill takes less than 2 seconds.  Neurological:     General: No focal deficit present.     Mental Status: She is alert and oriented to person, place, and time.     Cranial Nerves: No cranial nerve deficit.     Motor: No weakness.  Psychiatric:        Mood and Affect: Mood normal.        Behavior: Behavior normal.        Thought Content: Thought content normal.         Judgment: Judgment normal.        Assessment And Plan:     1. Diabetes mellitus type 2 in obese (HCC) - Hemoglobin A1c - dapagliflozin propanediol (FARXIGA) 10 MG TABS tablet; Take 1 tablet (10 mg total) by mouth daily.  Dispense: 30 tablet; Refill: 2 - POCT Urinalysis Dipstick (75102) - POCT UA -  Microalbumin  2. Essential hypertension Comments: Blood pressure is fairly controlled, continue current medications  3. Dyslipidemia Comments: Encouraged to continue medications - Lipid panel - CMP14+EGFR  4. Other long term (current) drug therapy - TSH - CBC  5. Chronic midline low back pain without sciatica Comments: Will check xray since has not been done - DG Lumbar Spine Complete; Future  6. Bloating Comments: Will check for pancreatic vs gallbladder issues Take probiotic daily - Lipase - Amylase  7. Encounter for hepatitis C screening test for low risk patient Will check Hepatitis C screening due to recent recommendations to screen all adults 18 years and older - Hepatitis C antibody  8. Encounter to establish care    Patient was given opportunity to ask questions. Patient verbalized understanding of the plan and was able to repeat key elements of the plan. All questions were answered to their satisfaction.  Minette Brine, FNP   I, Minette Brine, FNP, have reviewed all documentation for this visit. The documentation on 08/09/21 for the exam, diagnosis, procedures, and orders are all accurate and complete.   IF YOU HAVE BEEN REFERRED TO A SPECIALIST, IT MAY TAKE 1-2 WEEKS TO SCHEDULE/PROCESS THE REFERRAL. IF YOU HAVE NOT HEARD FROM US/SPECIALIST IN TWO WEEKS, PLEASE GIVE [place] A CALL AT [phone] X [phone].   THE PATIENT IS ENCOURAGED TO PRACTICE SOCIAL DISTANCING DUE TO THE COVID-19 PANDEMIC.

## 2021-08-10 LAB — CBC
Hematocrit: 34.8 % (ref 34.0–46.6)
Hemoglobin: 11 g/dL — ABNORMAL LOW (ref 11.1–15.9)
MCH: 25.9 pg — ABNORMAL LOW (ref 26.6–33.0)
MCHC: 31.6 g/dL (ref 31.5–35.7)
MCV: 82 fL (ref 79–97)
Platelets: 446 10*3/uL (ref 150–450)
RBC: 4.24 x10E6/uL (ref 3.77–5.28)
RDW: 13.7 % (ref 11.7–15.4)
WBC: 5.9 10*3/uL (ref 3.4–10.8)

## 2021-08-10 LAB — CMP14+EGFR
ALT: 21 IU/L (ref 0–32)
AST: 17 IU/L (ref 0–40)
Albumin/Globulin Ratio: 1.5 (ref 1.2–2.2)
Albumin: 4.1 g/dL (ref 3.8–4.8)
Alkaline Phosphatase: 87 IU/L (ref 44–121)
BUN/Creatinine Ratio: 13 (ref 9–23)
BUN: 13 mg/dL (ref 6–24)
Bilirubin Total: 0.3 mg/dL (ref 0.0–1.2)
CO2: 22 mmol/L (ref 20–29)
Calcium: 9.5 mg/dL (ref 8.7–10.2)
Chloride: 98 mmol/L (ref 96–106)
Creatinine, Ser: 0.98 mg/dL (ref 0.57–1.00)
Globulin, Total: 2.8 g/dL (ref 1.5–4.5)
Glucose: 219 mg/dL — ABNORMAL HIGH (ref 70–99)
Potassium: 4.9 mmol/L (ref 3.5–5.2)
Sodium: 135 mmol/L (ref 134–144)
Total Protein: 6.9 g/dL (ref 6.0–8.5)
eGFR: 71 mL/min/{1.73_m2} (ref >59)

## 2021-08-10 LAB — AMYLASE: Amylase: 76 U/L (ref 31–110)

## 2021-08-10 LAB — TSH: TSH: 2.07 u[IU]/mL (ref 0.450–4.500)

## 2021-08-10 LAB — LIPASE: Lipase: 29 U/L (ref 14–72)

## 2021-08-10 LAB — LIPID PANEL
Chol/HDL Ratio: 3.8 ratio (ref 0.0–4.4)
Cholesterol, Total: 129 mg/dL (ref 100–199)
HDL: 34 mg/dL — ABNORMAL LOW (ref >39)
LDL Chol Calc (NIH): 71 mg/dL (ref 0–99)
Triglycerides: 132 mg/dL (ref 0–149)
VLDL Cholesterol Cal: 24 mg/dL (ref 5–40)

## 2021-08-10 LAB — HEMOGLOBIN A1C
Est. average glucose Bld gHb Est-mCnc: 226 mg/dL
Hgb A1c MFr Bld: 9.5 % — ABNORMAL HIGH (ref 4.8–5.6)

## 2021-08-10 LAB — HEPATITIS C ANTIBODY: Hep C Virus Ab: <0.1 s/co ratio (ref 0.0–0.9)

## 2021-09-04 ENCOUNTER — Encounter: Payer: Self-pay | Admitting: Nurse Practitioner

## 2021-09-11 ENCOUNTER — Other Ambulatory Visit: Payer: Self-pay

## 2021-09-11 MED ORDER — VITAMIN D 25 MCG (1000 UNIT) PO TABS: 1000.0000 [IU] | ORAL_TABLET | Freq: Every day | ORAL | 2 refills | Status: AC

## 2021-10-10 ENCOUNTER — Other Ambulatory Visit: Payer: Self-pay

## 2021-10-10 ENCOUNTER — Encounter: Payer: Self-pay | Admitting: Nurse Practitioner

## 2021-10-10 ENCOUNTER — Ambulatory Visit (INDEPENDENT_AMBULATORY_CARE_PROVIDER_SITE_OTHER): Payer: Managed Care, Other (non HMO) | Admitting: Nurse Practitioner

## 2021-10-10 VITALS — BP 118/76 | HR 98 | Temp 98.7°F | Ht 65.0 in | Wt 231.0 lb

## 2021-10-10 DIAGNOSIS — Z6838 Body mass index (BMI) 38.0-38.9, adult: Secondary | ICD-10-CM

## 2021-10-10 DIAGNOSIS — I10 Essential (primary) hypertension: Secondary | ICD-10-CM | POA: Diagnosis not present

## 2021-10-10 DIAGNOSIS — E669 Obesity, unspecified: Secondary | ICD-10-CM

## 2021-10-10 DIAGNOSIS — Z1211 Encounter for screening for malignant neoplasm of colon: Secondary | ICD-10-CM

## 2021-10-10 DIAGNOSIS — Z1231 Encounter for screening mammogram for malignant neoplasm of breast: Secondary | ICD-10-CM

## 2021-10-10 DIAGNOSIS — E1169 Type 2 diabetes mellitus with other specified complication: Secondary | ICD-10-CM

## 2021-10-10 NOTE — Progress Notes (Signed)
I,Tianna Badgett,acting as a Education administrator for [employer], FNP.,have documented all relevant documentation on the behalf of Minette Brine, FNP,as directed by  Minette Brine, FNP while in the presence of Minette Brine, [place].  This visit occurred during the SARS-CoV-2 public health emergency.  Safety protocols were in place, including screening questions prior to the visit, additional usage of staff PPE, and extensive cleaning of exam room while observing appropriate contact time as indicated for disinfecting solutions.  Subjective:     Patient ID: Kelsey Olson , female    DOB: 11/28/1970 , 50 y.o.   MRN: [id number]   Chief Complaint  Patient presents with   Diabetes    HPI  Patient is here for HTN and DM follow up. She is taking [place] and doing well. She feels she has a little more energy and not as hungry.  She is exercising when she is able to due to her left hip getting a "catch"  [facility] - hip xray was done in 2019 - diagnosed with a form of bursitis.  She has not gone for her back xray due to multiple family illnesses and plans to go today.    Wt Readings from Last 3 Encounters: 10/10/21 : 231 lb (104.8 kg) 08/09/21 : 241 lb (109.3 kg) 12/27/20 : 230 lb (104.3 kg)     Diabetes She presents for her follow-up diabetic visit. She has type 2 diabetes mellitus. Her disease course has been stable. There are no diabetic associated symptoms. She is following a generally unhealthy diet. When asked about meal planning, she reported none. She has not had a previous visit with a dietitian. She rarely (due to pain to her left hip) participates in exercise. (Blood sugar 200-226, she ate some potato chips with her granddaughter last night and was 260.  )    Past Medical History:  Diagnosis Date   Chronic back pain    Diabetes mellitus 10/16/2003   Former smoker 02/12/2014   Hypertension    Obesity    Tubal pregnancy    Wears glasses      Family History  Problem Relation Age  of Onset   Heart disease Mother        CHF   Diabetes Mother    Kidney disease Mother        dialysis   COPD Father    Hepatitis C Father    Cancer Father        colon   Diabetes Sister    Hypertension Sister    Hypertension Brother    Diabetes Brother    Cancer Maternal Uncle        prostate   Heart disease Maternal Grandmother    Hypertension Brother      Current Outpatient Medications:    atorvastatin (LIPITOR) 20 MG tablet, Take 20 mg by mouth at bedtime., Disp: , Rfl:    cholecalciferol (VITAMIN D3) 25 MCG (1000 UNIT) tablet, Take 1 tablet (1,000 Units total) by mouth daily., Disp: 30 tablet, Rfl: 2   dapagliflozin propanediol (FARXIGA) 10 MG TABS tablet, Take 1 tablet (10 mg total) by mouth daily., Disp: 30 tablet, Rfl: 2   lisinopril-hydrochlorothiazide (ZESTORETIC) 20-12.5 MG tablet, Take 1 tablet by mouth 2 (two) times daily., Disp: , Rfl:    metFORMIN (GLUCOPHAGE-XR) 500 MG 24 hr tablet, Take 1 tablet (500 mg total) by mouth 2 (two) times daily., Disp: 180 tablet, Rfl: 0   triamcinolone ointment (KENALOG) 0.5 %, APPLY TOPICALLY TWICE A DAY, Disp: 30 g,  Rfl: 0   Allergies  Allergen Reactions   Percocet [Oxycodone-Acetaminophen] Itching and Other (See Comments)    Hallucinations     Review of Systems  Constitutional: Negative.   Respiratory: Negative.    Cardiovascular: Negative.   Gastrointestinal: Negative.   Neurological: Negative.     Today's Vitals   10/10/21 0858  BP: 118/76  Pulse: 98  Temp: 98.7 F (37.1 C)  TempSrc: Oral  Weight: 231 lb (104.8 kg)  Height: _0  (1.651 m)   Body mass index is 38.44 kg/m.   Objective:  Physical Exam Vitals reviewed.  Constitutional:      General: She is not in acute distress.    Appearance: Normal appearance. She is well-developed. She is obese.  Cardiovascular:     Rate and Rhythm: Normal rate and regular rhythm.     Pulses: Normal pulses.     Heart sounds: Normal heart sounds. No murmur  heard. Pulmonary:     Effort: Pulmonary effort is normal. No respiratory distress.     Breath sounds: Normal breath sounds. No wheezing.  Chest:     Chest wall: No tenderness.  Musculoskeletal:        General: Normal range of motion.  Skin:    General: Skin is warm and dry.     Capillary Refill: Capillary refill takes less than 2 seconds.  Neurological:     General: No focal deficit present.     Mental Status: She is alert and oriented to person, place, and time.     Cranial Nerves: No cranial nerve deficit.     Motor: No weakness.  Psychiatric:        Mood and Affect: Mood normal.        Behavior: Behavior normal.        Thought Content: Thought content normal.        Judgment: Judgment normal.        Assessment And Plan:     1. Diabetes mellitus type 2 in obese [facility]) Comments: Tolerating Farxiga well, had an episode of dizziness initially but has been doing well.  - CMP14+EGFR - Hemoglobin A1c  2. Essential hypertension Comments: Blood pressure is well controlled, continue current medications  3. Encounter for screening mammogram for breast cancer Pt instructed on Self Breast Exam.According to ACOG guidelines Women aged 49 and older are recommended to get an annual mammogram. Form completed and given to patient contact the [facility] for appointment scheduing.  Pt encouraged to get annual mammogram - MM DIGITAL SCREENING BILATERAL; Future  4. Encounter for screening colonoscopy According to USPTF Colorectal cancer Screening guidelines. Colonoscopy is recommended every 10 years, starting at age 97 years. Will refer to GI for colon cancer screening. - Ambulatory referral to Gastroenterology  5. BMI 38.0-38.9,adult She is encouraged to strive for BMI less than 30 to decrease cardiac risk. Advised to aim for at least 150 minutes of exercise per week.    Patient was given opportunity to ask questions. Patient verbalized understanding of the plan and was able to  repeat key elements of the plan. All questions were answered to their satisfaction.  Minette Brine, FNP   I, Minette Brine, FNP, have reviewed all documentation for this visit. The documentation on 10/10/21 for the exam, diagnosis, procedures, and orders are all accurate and complete.   IF YOU HAVE BEEN REFERRED TO A SPECIALIST, IT MAY TAKE 1-2 WEEKS TO SCHEDULE/PROCESS THE REFERRAL. IF YOU HAVE NOT HEARD FROM US/SPECIALIST IN TWO WEEKS, PLEASE  GIVE [place] A CALL AT [phone].   THE PATIENT IS ENCOURAGED TO PRACTICE SOCIAL DISTANCING DUE TO THE COVID-19 PANDEMIC.

## 2021-10-10 NOTE — Patient Instructions (Addendum)
Diabetes Mellitus Action Plan °Following a diabetes action plan is a way for you to manage your diabetes (diabetes mellitus) symptoms. The plan is color-coded to help you understand what actions you need to take based on any symptoms you are having. °If you have symptoms in the red zone, you need medical care right away. °If you have symptoms in the yellow zone, you are having problems. °If you have symptoms in the green zone, you are doing well. °Learning about and understanding diabetes can take time. Follow the plan that you develop with your health care provider. Know the target range for your blood sugar (glucose) level, and review your treatment plan with your health care provider at each visit. °The target range for my blood sugar level is __________________________ mg/dL. °Red zone °Get medical help right away if you have any of the following symptoms: °A blood sugar test result that is below 54 mg/dL (3 mmol/L). °A blood sugar test result that is at or above 240 mg/dL (13.3 mmol/L) for 2 days in a row. °Confusion or trouble thinking clearly. °Difficulty breathing. °Sickness or a fever for 2 or more days that is not getting better. °Moderate or large ketone levels in your urine. °Feeling tired or having no energy. °If you have any red zone symptoms, do not wait to see if the symptoms will go away. Get medical help right away. Call your local emergency services (911 in the [place] Do not drive yourself to the hospital. °If you have severely low blood sugar (severe hypoglycemia) and you cannot eat or drink, you may need glucagon. Make sure a family member or close friend knows how to check your blood sugar and how to give you glucagon. You may need to be treated in a hospital for this condition. °Yellow zone °If you have any of the following symptoms, your diabetes is not under control and you may need to make some changes: °A blood sugar test result that is at or above 240 mg/dL (13.3 mmol/L) for 2 days in a  row. °Blood sugar test results that are below 70 mg/dL (3.9 mmol/L). °Other symptoms of hypoglycemia, such as: °Shaking or feeling light-headed. °Confusion or irritability. °Feeling hungry. °Having a fast heartbeat. °If you have any yellow zone symptoms: °Treat your hypoglycemia by eating or drinking 15 grams of a rapid-acting carbohydrate. Follow the 15:15 rule: °Take 15 grams of a rapid-acting carbohydrate, such as: °1 tube of glucose gel. °4 glucose pills. °4 oz (120 mL) of fruit juice. °4 oz (120 mL) of regular (not diet) soda. °Check your blood sugar 15 minutes after you take the carbohydrate. °If the repeat blood sugar test is still at or below 70 mg/dL (3.9 mmol/L), take 15 grams of a carbohydrate again. °If your blood sugar does not increase above 70 mg/dL (3.9 mmol/L) after 3 tries, get medical help right away. °After your blood sugar returns to normal, eat a meal or a snack within 1 hour. °Keep taking your daily medicines as told by your health care provider. °Check your blood sugar more often than you normally would. °Write down your results. °Call your health care provider if you have trouble keeping your blood sugar in your target range. ° °Green zone °These signs mean you are doing well and you can continue what you are doing to manage your diabetes: °Your blood sugar is within your personal target range. For most people, a blood sugar level before a meal (preprandial) should be 80-130 mg/dL (4.4-7.2 mmol/L). °You feel   well, and you are able to do daily activities. If you are in the green zone, continue to manage your diabetes as told by your health care provider. To do this: Eat a healthy diet. Exercise regularly. Check your blood sugar as told by your health care provider. Take your medicines as told by your health care provider.  Where to find more information American Diabetes Association (ADA): diabetes.org Association of Diabetes Care & Education Specialists (ADCES):  diabeteseducator.org Summary Following a diabetes action plan is a way for you to manage your diabetes symptoms. The plan is color-coded to help you understand what actions you need to take based on any symptoms you are having. Follow the plan that you develop with your health care provider. Make sure you know your personal target blood sugar level. Review your treatment plan with your health care provider at each visit. This information is not intended to replace advice given to you by your health care provider. Make sure you discuss any questions you have with your health care provider. Document Revised: 04/07/2020 Document Reviewed: 04/07/2020 Elsevier Patient Education  2022 [employer].   Congratulations on your 10 lb weight loss!!

## 2021-10-11 LAB — CMP14+EGFR
ALT: 16 IU/L (ref 0–32)
AST: 13 IU/L (ref 0–40)
Albumin/Globulin Ratio: 1.6 (ref 1.2–2.2)
Albumin: 4.6 g/dL (ref 3.8–4.8)
Alkaline Phosphatase: 91 IU/L (ref 44–121)
BUN/Creatinine Ratio: 19 (ref 9–23)
BUN: 20 mg/dL (ref 6–24)
Bilirubin Total: 0.3 mg/dL (ref 0.0–1.2)
CO2: 22 mmol/L (ref 20–29)
Calcium: 9.7 mg/dL (ref 8.7–10.2)
Chloride: 98 mmol/L (ref 96–106)
Creatinine, Ser: 1.08 mg/dL — ABNORMAL HIGH (ref 0.57–1.00)
Globulin, Total: 2.8 g/dL (ref 1.5–4.5)
Glucose: 223 mg/dL — ABNORMAL HIGH (ref 70–99)
Potassium: 5.1 mmol/L (ref 3.5–5.2)
Sodium: 135 mmol/L (ref 134–144)
Total Protein: 7.4 g/dL (ref 6.0–8.5)
eGFR: 63 mL/min/{1.73_m2} (ref >59)

## 2021-10-11 LAB — HEMOGLOBIN A1C
Est. average glucose Bld gHb Est-mCnc: 232 mg/dL
Hgb A1c MFr Bld: 9.7 % — ABNORMAL HIGH (ref 4.8–5.6)

## 2021-10-24 ENCOUNTER — Other Ambulatory Visit: Payer: Self-pay | Admitting: Nurse Practitioner

## 2021-10-24 ENCOUNTER — Other Ambulatory Visit: Payer: Self-pay

## 2021-10-24 ENCOUNTER — Encounter: Payer: Self-pay | Admitting: Nurse Practitioner

## 2021-10-24 DIAGNOSIS — E669 Obesity, unspecified: Secondary | ICD-10-CM

## 2021-10-24 MED ORDER — OZEMPIC (0.25 OR 0.5 MG/DOSE) 2 MG/1.5ML ~~LOC~~ SOPN: 0.5000 mg | PEN_INJECTOR | SUBCUTANEOUS | 1 refills | Status: DC

## 2021-10-24 MED ORDER — MOUNJARO 5 MG/0.5ML ~~LOC~~ SOAJ: 5.0000 mg | SUBCUTANEOUS | 0 refills | Status: DC

## 2021-10-24 MED ORDER — MOUNJARO 2.5 MG/0.5ML ~~LOC~~ SOAJ: 2.5000 mg | SUBCUTANEOUS | 0 refills | Status: DC

## 2021-10-24 NOTE — Progress Notes (Signed)
Called patient to clarify I want her to start [facility] since Ozempic was not effective previously. I had initially sent ozempic but once reviewing her initial visit note realized the ozempic was not effective. 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

## 2021-10-24 NOTE — Telephone Encounter (Signed)
Send the 2.5mg  dose to pharmacy since we don't have samples

## 2021-10-25 ENCOUNTER — Ambulatory Visit: Payer: Managed Care, Other (non HMO)

## 2021-10-25 ENCOUNTER — Other Ambulatory Visit: Payer: Self-pay

## 2021-10-30 ENCOUNTER — Other Ambulatory Visit: Payer: Self-pay

## 2021-10-30 MED ORDER — ATORVASTATIN CALCIUM 20 MG PO TABS: 20.0000 mg | ORAL_TABLET | Freq: Every day | ORAL | 1 refills | Status: DC

## 2021-11-08 ENCOUNTER — Ambulatory Visit
Admission: RE | Admit: 2021-11-08 | Discharge: 2021-11-08 | Disposition: A | Discharge status: Home / Self Care | Payer: Managed Care, Other (non HMO) | Source: Ambulatory Visit | Attending: Nurse Practitioner | Admitting: Nurse Practitioner

## 2021-11-08 DIAGNOSIS — Z1231 Encounter for screening mammogram for malignant neoplasm of breast: Secondary | ICD-10-CM

## 2021-11-10 ENCOUNTER — Other Ambulatory Visit: Payer: Self-pay | Admitting: Nurse Practitioner

## 2021-11-10 DIAGNOSIS — R928 Other abnormal and inconclusive findings on diagnostic imaging of breast: Secondary | ICD-10-CM

## 2021-11-13 ENCOUNTER — Other Ambulatory Visit: Payer: Self-pay

## 2021-12-01 ENCOUNTER — Ambulatory Visit: Payer: Managed Care, Other (non HMO)

## 2021-12-01 ENCOUNTER — Ambulatory Visit
Admission: RE | Admit: 2021-12-01 | Discharge: 2021-12-01 | Disposition: A | Discharge status: Home / Self Care | Payer: Managed Care, Other (non HMO) | Source: Ambulatory Visit | Attending: Nurse Practitioner | Admitting: Nurse Practitioner

## 2021-12-01 DIAGNOSIS — R922 Inconclusive mammogram: Secondary | ICD-10-CM | POA: Diagnosis not present

## 2021-12-01 DIAGNOSIS — R928 Other abnormal and inconclusive findings on diagnostic imaging of breast: Secondary | ICD-10-CM

## 2021-12-05 LAB — HM COLONOSCOPY

## 2021-12-28 ENCOUNTER — Ambulatory Visit
Admission: RE | Admit: 2021-12-28 | Discharge: 2021-12-28 | Disposition: A | Discharge status: Home / Self Care | Payer: Managed Care, Other (non HMO) | Source: Ambulatory Visit | Attending: Nurse Practitioner | Admitting: Nurse Practitioner

## 2021-12-28 DIAGNOSIS — M545 Low back pain, unspecified: Secondary | ICD-10-CM | POA: Diagnosis not present

## 2022-01-02 ENCOUNTER — Encounter: Payer: Self-pay | Admitting: Nurse Practitioner

## 2022-01-10 ENCOUNTER — Other Ambulatory Visit: Payer: Self-pay

## 2022-01-10 ENCOUNTER — Encounter: Payer: Self-pay | Admitting: Nurse Practitioner

## 2022-01-10 ENCOUNTER — Ambulatory Visit (INDEPENDENT_AMBULATORY_CARE_PROVIDER_SITE_OTHER): Payer: Managed Care, Other (non HMO) | Admitting: Nurse Practitioner

## 2022-01-10 ENCOUNTER — Other Ambulatory Visit (HOSPITAL_COMMUNITY)
Admission: RE | Admit: 2022-01-10 | Discharge: 2022-01-10 | Disposition: A | Discharge status: Home / Self Care | Payer: Managed Care, Other (non HMO) | Source: Ambulatory Visit | Attending: Nurse Practitioner | Admitting: Nurse Practitioner

## 2022-01-10 VITALS — BP 130/64 | HR 99 | Temp 97.8°F | Ht 65.0 in | Wt 229.0 lb

## 2022-01-10 DIAGNOSIS — Z124 Encounter for screening for malignant neoplasm of cervix: Secondary | ICD-10-CM | POA: Insufficient documentation

## 2022-01-10 DIAGNOSIS — N898 Other specified noninflammatory disorders of vagina: Secondary | ICD-10-CM | POA: Insufficient documentation

## 2022-01-10 DIAGNOSIS — I10 Essential (primary) hypertension: Secondary | ICD-10-CM

## 2022-01-10 DIAGNOSIS — E669 Obesity, unspecified: Secondary | ICD-10-CM

## 2022-01-10 DIAGNOSIS — M545 Low back pain, unspecified: Secondary | ICD-10-CM | POA: Diagnosis not present

## 2022-01-10 DIAGNOSIS — E785 Hyperlipidemia, unspecified: Secondary | ICD-10-CM

## 2022-01-10 DIAGNOSIS — G8929 Other chronic pain: Secondary | ICD-10-CM

## 2022-01-10 DIAGNOSIS — R21 Rash and other nonspecific skin eruption: Secondary | ICD-10-CM

## 2022-01-10 DIAGNOSIS — Z0000 Encounter for general adult medical examination without abnormal findings: Secondary | ICD-10-CM

## 2022-01-10 DIAGNOSIS — Z6838 Body mass index (BMI) 38.0-38.9, adult: Secondary | ICD-10-CM

## 2022-01-10 DIAGNOSIS — Z79899 Other long term (current) drug therapy: Secondary | ICD-10-CM

## 2022-01-10 DIAGNOSIS — E1169 Type 2 diabetes mellitus with other specified complication: Secondary | ICD-10-CM

## 2022-01-10 DIAGNOSIS — E66812 Obesity, class 2: Secondary | ICD-10-CM

## 2022-01-10 LAB — POCT URINALYSIS DIPSTICK
Bilirubin, UA: NEGATIVE
Blood, UA: NEGATIVE
Glucose, UA: POSITIVE — AB
Ketones, UA: NEGATIVE
Leukocytes, UA: NEGATIVE
Nitrite, UA: NEGATIVE
Protein, UA: NEGATIVE
Spec Grav, UA: 1.025 (ref 1.010–1.025)
Urobilinogen, UA: 0.2 E.U./dL
pH, UA: 5.5 (ref 5.0–8.0)

## 2022-01-10 MED ORDER — FLUCONAZOLE 100 MG PO TABS: 100.0000 mg | ORAL_TABLET | Freq: Every day | ORAL | 0 refills | Status: DC

## 2022-01-10 NOTE — Patient Instructions (Signed)
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
## 2022-01-11 LAB — CBC
Hematocrit: 38.2 % (ref 34.0–46.6)
Hemoglobin: 11.9 g/dL (ref 11.1–15.9)
MCH: 25.3 pg — ABNORMAL LOW (ref 26.6–33.0)
MCHC: 31.2 g/dL — ABNORMAL LOW (ref 31.5–35.7)
MCV: 81 fL (ref 79–97)
Platelets: 424 10*3/uL (ref 150–450)
RBC: 4.71 x10E6/uL (ref 3.77–5.28)
RDW: 13.7 % (ref 11.7–15.4)
WBC: 5.9 10*3/uL (ref 3.4–10.8)

## 2022-01-11 LAB — HEMOGLOBIN A1C
Est. average glucose Bld gHb Est-mCnc: 232 mg/dL
Hgb A1c MFr Bld: 9.7 % — ABNORMAL HIGH (ref 4.8–5.6)

## 2022-01-11 LAB — MICROALBUMIN / CREATININE URINE RATIO
Creatinine, Urine: 91.2 mg/dL
Microalb/Creat Ratio: 4 mg/g creat (ref 0–29)
Microalbumin, Urine: 3.4 ug/mL

## 2022-01-11 LAB — LIPID PANEL
Chol/HDL Ratio: 3.9 ratio (ref 0.0–4.4)
Cholesterol, Total: 139 mg/dL (ref 100–199)
HDL: 36 mg/dL — ABNORMAL LOW (ref >39)
LDL Chol Calc (NIH): 73 mg/dL (ref 0–99)
Triglycerides: 176 mg/dL — ABNORMAL HIGH (ref 0–149)
VLDL Cholesterol Cal: 30 mg/dL (ref 5–40)

## 2022-01-11 LAB — VITAMIN D 25 HYDROXY (VIT D DEFICIENCY, FRACTURES): Vit D, 25-Hydroxy: 25.5 ng/mL — ABNORMAL LOW (ref 30.0–100.0)

## 2022-01-11 LAB — CMP14+EGFR
ALT: 16 IU/L (ref 0–32)
AST: 13 IU/L (ref 0–40)
Albumin/Globulin Ratio: 1.2 (ref 1.2–2.2)
Albumin: 4 g/dL (ref 3.8–4.8)
Alkaline Phosphatase: 114 IU/L (ref 44–121)
BUN/Creatinine Ratio: 11 (ref 9–23)
BUN: 11 mg/dL (ref 6–24)
Bilirubin Total: <0.2 mg/dL (ref 0.0–1.2)
CO2: 26 mmol/L (ref 20–29)
Calcium: 9.5 mg/dL (ref 8.7–10.2)
Chloride: 99 mmol/L (ref 96–106)
Creatinine, Ser: 1.02 mg/dL — ABNORMAL HIGH (ref 0.57–1.00)
Globulin, Total: 3.3 g/dL (ref 1.5–4.5)
Glucose: 220 mg/dL — ABNORMAL HIGH (ref 70–99)
Potassium: 4.6 mmol/L (ref 3.5–5.2)
Sodium: 136 mmol/L (ref 134–144)
Total Protein: 7.3 g/dL (ref 6.0–8.5)
eGFR: 67 mL/min/{1.73_m2} (ref >59)

## 2022-01-13 ENCOUNTER — Other Ambulatory Visit: Payer: Self-pay | Admitting: Nurse Practitioner

## 2022-01-13 DIAGNOSIS — E1169 Type 2 diabetes mellitus with other specified complication: Secondary | ICD-10-CM

## 2022-01-15 LAB — CERVICOVAGINAL ANCILLARY ONLY
Bacterial Vaginitis (gardnerella): NEGATIVE
Candida Glabrata: NEGATIVE
Candida Vaginitis: NEGATIVE
Comment: NEGATIVE
Comment: NEGATIVE
Comment: NEGATIVE

## 2022-01-15 LAB — CYTOLOGY - PAP
Comment: NEGATIVE
Diagnosis: NEGATIVE
High risk HPV: NEGATIVE

## 2022-02-08 ENCOUNTER — Other Ambulatory Visit: Payer: Self-pay | Admitting: Nurse Practitioner

## 2022-02-08 DIAGNOSIS — E1169 Type 2 diabetes mellitus with other specified complication: Secondary | ICD-10-CM

## 2022-02-13 ENCOUNTER — Other Ambulatory Visit: Payer: Self-pay | Admitting: Nurse Practitioner

## 2022-02-13 DIAGNOSIS — E669 Obesity, unspecified: Secondary | ICD-10-CM

## 2022-02-14 ENCOUNTER — Other Ambulatory Visit: Payer: Self-pay

## 2022-02-14 ENCOUNTER — Telehealth: Payer: Self-pay

## 2022-02-14 DIAGNOSIS — E669 Obesity, unspecified: Secondary | ICD-10-CM

## 2022-02-14 MED ORDER — MOUNJARO 5 MG/0.5ML ~~LOC~~ SOAJ: 5.0000 mg | SUBCUTANEOUS | 0 refills | Status: DC

## 2022-02-14 MED ORDER — MOUNJARO 7.5 MG/0.5ML ~~LOC~~ SOAJ: 7.5000 mg | SUBCUTANEOUS | 0 refills | Status: DC

## 2022-02-14 MED ORDER — MOUNJARO 10 MG/0.5ML ~~LOC~~ SOAJ: 10.0000 mg | SUBCUTANEOUS | 0 refills | Status: DC

## 2022-02-14 NOTE — Telephone Encounter (Signed)
Spoke with patient. Provider states for her to continue with mounjaro 5MG . Until we complete PA. Patient awrae, patient notified.  ?

## 2022-04-11 ENCOUNTER — Other Ambulatory Visit: Payer: Self-pay

## 2022-04-11 MED ORDER — MOUNJARO 7.5 MG/0.5ML ~~LOC~~ SOAJ: 7.5000 mg | SUBCUTANEOUS | 1 refills | Status: DC

## 2022-05-14 ENCOUNTER — Encounter: Payer: Self-pay | Admitting: Nurse Practitioner

## 2022-05-14 ENCOUNTER — Ambulatory Visit: Payer: Managed Care, Other (non HMO) | Admitting: Nurse Practitioner

## 2022-05-14 VITALS — BP 118/62 | HR 95 | Temp 98.7°F | Ht 65.0 in | Wt 220.0 lb

## 2022-05-14 DIAGNOSIS — Z6836 Body mass index (BMI) 36.0-36.9, adult: Secondary | ICD-10-CM

## 2022-05-14 DIAGNOSIS — E1169 Type 2 diabetes mellitus with other specified complication: Secondary | ICD-10-CM | POA: Diagnosis not present

## 2022-05-14 DIAGNOSIS — I10 Essential (primary) hypertension: Secondary | ICD-10-CM | POA: Diagnosis not present

## 2022-05-14 DIAGNOSIS — Z114 Encounter for screening for human immunodeficiency virus [HIV]: Secondary | ICD-10-CM | POA: Diagnosis not present

## 2022-05-14 DIAGNOSIS — E785 Hyperlipidemia, unspecified: Secondary | ICD-10-CM

## 2022-05-14 DIAGNOSIS — R058 Other specified cough: Secondary | ICD-10-CM | POA: Diagnosis not present

## 2022-05-14 DIAGNOSIS — E669 Obesity, unspecified: Secondary | ICD-10-CM | POA: Diagnosis not present

## 2022-05-14 DIAGNOSIS — R0683 Snoring: Secondary | ICD-10-CM

## 2022-05-14 NOTE — Patient Instructions (Signed)

## 2022-05-14 NOTE — Progress Notes (Unsigned)
I,Tianna Badgett,acting as a Education administrator for [employer], FNP.,have documented all relevant documentation on the behalf of Minette Brine, FNP,as directed by  Minette Brine, FNP while in the presence of Minette Brine, [place].  Subjective:     Patient ID: Kelsey Olson , female    DOB: July 04, 1971 , 51 y.o.   MRN: [id number]   Chief Complaint  Patient presents with   Diabetes   Hypertension    HPI  Patient is here for HTN and DM follow up. Reports having constipation but will take stool softner once a week that has been effective.   Wt Readings from Last 3 Encounters: 05/14/22 : 220 lb (99.8 kg) 01/10/22 : 229 lb (103.9 kg) 10/10/21 : 231 lb (104.8 kg)    Diabetes She presents for her follow-up diabetic visit. She has type 2 diabetes mellitus. Her disease course has been stable. There are no hypoglycemic associated symptoms. There are no diabetic associated symptoms. There are no hypoglycemic complications. Symptoms are stable. There are no diabetic complications. Risk factors for coronary artery disease include obesity and sedentary lifestyle. Her weight is stable. She is following a generally healthy (She is eating more salads and eating more vegetables.) diet. When asked about meal planning, she reported none. She has not had a previous visit with a dietitian. She rarely (due to pain to her left hip) participates in exercise. (In the last month the highest was 221 with what is uploaded and the lowest 146. ) She does not see a podiatrist.Eye exam is not current (She has been to the eye doctor - found to have tears to bilateral eyes and had laser to reattach - did not feel was diabetes related).  Hypertension This is a chronic problem. The current episode started more than 1 year ago. The problem has been gradually improving since onset. The problem is controlled. Pertinent negatives include no anxiety or shortness of breath. Risk factors for coronary artery disease include obesity and sedentary  lifestyle. Past treatments include ACE inhibitors and diuretics. Compliance problems include exercise.  There is no history of angina. There is no history of chronic renal disease.     Past Medical History:  Diagnosis Date   Chronic back pain    Diabetes mellitus 10/16/2003   Former smoker 02/12/2014   Hypertension    Obesity    Tubal pregnancy    Wears glasses      Family History  Problem Relation Age of Onset   Heart disease Mother        CHF   Diabetes Mother    Kidney disease Mother        dialysis   COPD Father    Hepatitis C Father    Cancer Father        colon   Diabetes Sister    Hypertension Sister    Cancer Maternal Uncle        prostate   Heart disease Maternal Grandmother    Hypertension Brother    Diabetes Brother    Hypertension Brother    Breast cancer Neg Hx      Current Outpatient Medications:    atorvastatin (LIPITOR) 20 MG tablet, Take 1 tablet (20 mg total) by mouth at bedtime., Disp: 90 tablet, Rfl: 1   cholecalciferol (VITAMIN D3) 25 MCG (1000 UNIT) tablet, Take 1 tablet (1,000 Units total) by mouth daily., Disp: 30 tablet, Rfl: 2   lisinopril-hydrochlorothiazide (ZESTORETIC) 20-12.5 MG tablet, Take 1 tablet by mouth 2 (two) times daily., Disp: ,  Rfl:    tirzepatide (MOUNJARO) 7.5 MG/0.5ML Pen, Inject 7.5 mg into the skin once a week., Disp: 2 mL, Rfl: 1   triamcinolone ointment (KENALOG) 0.5 %, APPLY TOPICALLY TWICE A DAY, Disp: 30 g, Rfl: 0   Allergies  Allergen Reactions   Percocet [Oxycodone-Acetaminophen] Itching and Other (See Comments)    Hallucinations     Review of Systems  Constitutional: Negative.   Respiratory:  Positive for cough (nocturnal cough). Negative for choking, chest tightness, shortness of breath and wheezing.   Cardiovascular: Negative.   Gastrointestinal: Negative.   Neurological: Negative.      Today's Vitals   05/14/22 0835  BP: 118/62  Pulse: 95  Temp: 98.7 F (37.1 C)  TempSrc: Oral  Weight: 220 lb  (99.8 kg)  Height: 5' 5"  (1.651 m)   Body mass index is 36.61 kg/m.  Wt Readings from Last 3 Encounters:  05/14/22 220 lb (99.8 kg)  01/10/22 229 lb (103.9 kg)  10/10/21 231 lb (104.8 kg)    Objective:  Physical Exam Vitals reviewed.  Constitutional:      General: She is not in acute distress.    Appearance: Normal appearance. She is well-developed. She is obese.  Cardiovascular:     Rate and Rhythm: Normal rate and regular rhythm.     Pulses: Normal pulses.     Heart sounds: Normal heart sounds. No murmur heard. Pulmonary:     Effort: Pulmonary effort is normal. No respiratory distress.     Breath sounds: Normal breath sounds. No wheezing.  Chest:     Chest wall: No tenderness.  Musculoskeletal:        General: Normal range of motion.  Skin:    General: Skin is warm and dry.     Capillary Refill: Capillary refill takes less than 2 seconds.  Neurological:     General: No focal deficit present.     Mental Status: She is alert and oriented to person, place, and time.     Cranial Nerves: No cranial nerve deficit.     Motor: No weakness.  Psychiatric:        Mood and Affect: Mood normal.        Behavior: Behavior normal.        Thought Content: Thought content normal.        Judgment: Judgment normal.         Assessment And Plan:     1. Diabetes mellitus type 2 in obese [facility]) Comments: HgbA1c is stable at 9.7, continue Mounjaro 7.5 mg weekly.  Diabetic foot exam done.  - Hemoglobin A1c - BMP8+EGFR  2. Essential hypertension Comments: Blood pressure is well controlled, continue current medications. - BMP8+EGFR  3. Dyslipidemia Comments: Continue statin, tolerating well.  - Lipid panel  4. Nocturnal cough Comments: Will check a sleep study as she also has snoring, also has obesity which can be a contributing factor with sleep apnea. Her cough has been persistent.  - Ambulatory referral to Sleep Studies  5. Snoring  6. Encounter for screening for HIV - HIV  Antibody (routine testing w rflx)  7. Class 2 severe obesity due to excess calories with serious comorbidity and body mass index (BMI) of 36.0 to 36.9 in adult [facility]) She is encouraged to strive for BMI less than 30 to decrease cardiac risk. Advised to aim for at least 150 minutes of exercise per week.    Patient was given opportunity to ask questions. Patient verbalized understanding of the plan and was  able to repeat key elements of the plan. All questions were answered to their satisfaction.  Minette Brine, FNP   I, Minette Brine, FNP, have reviewed all documentation for this visit. The documentation on 05/14/22 for the exam, diagnosis, procedures, and orders are all accurate and complete.   IF YOU HAVE BEEN REFERRED TO A SPECIALIST, IT MAY TAKE 1-2 WEEKS TO SCHEDULE/PROCESS THE REFERRAL. IF YOU HAVE NOT HEARD FROM US/SPECIALIST IN TWO WEEKS, PLEASE GIVE [place] A CALL AT [phone].   THE PATIENT IS ENCOURAGED TO PRACTICE SOCIAL DISTANCING DUE TO THE COVID-19 PANDEMIC.

## 2022-05-15 LAB — LIPID PANEL
Chol/HDL Ratio: 3.4 ratio (ref 0.0–4.4)
Cholesterol, Total: 123 mg/dL (ref 100–199)
HDL: 36 mg/dL — ABNORMAL LOW (ref >39)
LDL Chol Calc (NIH): 68 mg/dL (ref 0–99)
Triglycerides: 100 mg/dL (ref 0–149)
VLDL Cholesterol Cal: 19 mg/dL (ref 5–40)

## 2022-05-15 LAB — BMP8+EGFR
BUN/Creatinine Ratio: 12 (ref 9–23)
BUN: 13 mg/dL (ref 6–24)
CO2: 23 mmol/L (ref 20–29)
Calcium: 9.5 mg/dL (ref 8.7–10.2)
Chloride: 96 mmol/L (ref 96–106)
Creatinine, Ser: 1.07 mg/dL — ABNORMAL HIGH (ref 0.57–1.00)
Glucose: 209 mg/dL — ABNORMAL HIGH (ref 70–99)
Potassium: 4.3 mmol/L (ref 3.5–5.2)
Sodium: 139 mmol/L (ref 134–144)
eGFR: 64 mL/min/{1.73_m2} (ref >59)

## 2022-05-15 LAB — HEMOGLOBIN A1C
Est. average glucose Bld gHb Est-mCnc: 206 mg/dL
Hgb A1c MFr Bld: 8.8 % — ABNORMAL HIGH (ref 4.8–5.6)

## 2022-05-15 LAB — HIV ANTIBODY (ROUTINE TESTING W REFLEX): HIV Screen 4th Generation wRfx: NONREACTIVE

## 2022-06-30 ENCOUNTER — Other Ambulatory Visit: Payer: Self-pay | Admitting: Nurse Practitioner

## 2022-07-04 ENCOUNTER — Encounter: Payer: Self-pay | Admitting: Nurse Practitioner

## 2022-07-04 ENCOUNTER — Other Ambulatory Visit: Payer: Self-pay | Admitting: Nurse Practitioner

## 2022-08-24 ENCOUNTER — Other Ambulatory Visit: Payer: Self-pay | Admitting: Nurse Practitioner

## 2022-09-12 ENCOUNTER — Ambulatory Visit: Payer: Managed Care, Other (non HMO) | Admitting: Nurse Practitioner

## 2022-09-12 ENCOUNTER — Encounter: Payer: Self-pay | Admitting: Nurse Practitioner

## 2022-09-12 VITALS — BP 122/74 | HR 87 | Temp 98.4°F | Ht 64.0 in | Wt 218.8 lb

## 2022-09-12 DIAGNOSIS — E1169 Type 2 diabetes mellitus with other specified complication: Secondary | ICD-10-CM

## 2022-09-12 DIAGNOSIS — N926 Irregular menstruation, unspecified: Secondary | ICD-10-CM

## 2022-09-12 DIAGNOSIS — I10 Essential (primary) hypertension: Secondary | ICD-10-CM

## 2022-09-12 DIAGNOSIS — E785 Hyperlipidemia, unspecified: Secondary | ICD-10-CM | POA: Diagnosis not present

## 2022-09-12 DIAGNOSIS — L659 Nonscarring hair loss, unspecified: Secondary | ICD-10-CM

## 2022-09-12 DIAGNOSIS — E559 Vitamin D deficiency, unspecified: Secondary | ICD-10-CM

## 2022-09-12 DIAGNOSIS — Z6837 Body mass index (BMI) 37.0-37.9, adult: Secondary | ICD-10-CM

## 2022-09-12 DIAGNOSIS — E669 Obesity, unspecified: Secondary | ICD-10-CM

## 2022-09-12 DIAGNOSIS — R21 Rash and other nonspecific skin eruption: Secondary | ICD-10-CM

## 2022-09-12 MED ORDER — LISINOPRIL-HYDROCHLOROTHIAZIDE 20-12.5 MG PO TABS: 1.0000 | ORAL_TABLET | Freq: Every day | ORAL | 1 refills | Status: DC

## 2022-09-12 MED ORDER — TRIAMCINOLONE ACETONIDE 0.5 % EX OINT: TOPICAL_OINTMENT | Freq: Two times a day (BID) | CUTANEOUS | 3 refills | Status: DC

## 2022-09-12 NOTE — Progress Notes (Signed)
I,Victoria T Hamilton,acting as a Education administrator for Minette Brine, FNP.,have documented all relevant documentation on the behalf of Minette Brine, FNP,as directed by  Minette Brine, FNP while in the presence of Minette Brine, [place].    Subjective:     Patient ID: Kelsey Olson , female    DOB: 1971/04/10 , 51 y.o.   MRN: [id number]   Chief Complaint  Patient presents with   Diabetes   Hypertension    HPI  Patient is here for HTN and DM follow up. Continues to take Mounjaro 10 mg unable to eat a lot. Continues to exercise 2 times a week about 30 minutes each. She had been continuing to get her lisinopril/HCTZ from previous provider unknowingly.   Denies dizziness, SOB ,chest pain, blurred vision.  Since August has had hair loss to the point of cutting her hair. She is now taking a vitamin B supplement that has helped. She had been fatigue and tired. She has had 3 menstrual cycles since October. She does not have a gynecologist.   Wt Readings from Last 3 Encounters: 09/12/22 : 218 lb 12.8 oz (99.2 kg) 05/14/22 : 220 lb (99.8 kg) 01/10/22 : 229 lb (103.9 kg)     Diabetes She presents for her follow-up diabetic visit. She has type 2 diabetes mellitus. Her disease course has been stable. There are no hypoglycemic associated symptoms. There are no diabetic associated symptoms. Pertinent negatives for diabetes include no blurred vision and no chest pain. There are no hypoglycemic complications. Symptoms are stable. There are no diabetic complications. Risk factors for coronary artery disease include obesity and sedentary lifestyle. Current diabetic treatment includes oral agent (monotherapy). Her weight is stable. She is following a generally unhealthy diet. When asked about meal planning, she reported none. She has not had a previous visit with a dietitian. She rarely (due to pain to her left hip) participates in exercise. (Blood sugar 200-226, she ate some potato chips with her granddaughter last  night and was 260.  ) She does not see a podiatrist.Eye exam is not current (She has been to the eye doctor - found to have tears to bilateral eyes and had laser to reattach - did not feel was diabetes related).  Hypertension This is a chronic problem. The current episode started more than 1 year ago. The problem has been gradually improving since onset. The problem is controlled. Pertinent negatives include no anxiety, blurred vision, chest pain or shortness of breath. Risk factors for coronary artery disease include obesity and sedentary lifestyle. Past treatments include ACE inhibitors and diuretics. Compliance problems include exercise.  There is no history of angina. There is no history of chronic renal disease.     Past Medical History:  Diagnosis Date   Chronic back pain    Diabetes mellitus 10/16/2003   Former smoker 02/12/2014   Hypertension    Obesity    Tubal pregnancy    Wears glasses      Family History  Problem Relation Age of Onset   Heart disease Mother        CHF   Diabetes Mother    Kidney disease Mother        dialysis   COPD Father    Hepatitis C Father    Cancer Father        colon   Diabetes Sister    Hypertension Sister    Cancer Maternal Uncle        prostate   Heart disease Maternal Grandmother  Hypertension Brother    Diabetes Brother    Hypertension Brother    Breast cancer Neg Hx      Current Outpatient Medications:    atorvastatin (LIPITOR) 20 MG tablet, Take 1 tablet (20 mg total) by mouth at bedtime., Disp: 90 tablet, Rfl: 1   cholecalciferol (VITAMIN D3) 25 MCG (1000 UNIT) tablet, Take 1 tablet (1,000 Units total) by mouth daily., Disp: 30 tablet, Rfl: 2   MOUNJARO 10 MG/0.5ML Pen, INJECT 10 MG INTO THE SKIN ONCE A WEEK., Disp: 8 mL, Rfl: 0   ferrous sulfate 325 (65 FE) MG EC tablet, Take 1 tablet (325 mg total) by mouth 2 (two) times daily., Disp: 60 tablet, Rfl: 3   lisinopril-hydrochlorothiazide (ZESTORETIC) 20-12.5 MG tablet, Take 1  tablet by mouth daily., Disp: 90 tablet, Rfl: 1   triamcinolone ointment (KENALOG) 0.5 %, Apply topically 2 (two) times daily., Disp: 30 g, Rfl: 3   Allergies  Allergen Reactions   Percocet [Oxycodone-Acetaminophen] Itching and Other (See Comments)    Hallucinations     Review of Systems  Eyes:  Negative for blurred vision.  Respiratory:  Negative for shortness of breath and wheezing.   Cardiovascular:  Negative for chest pain.  Skin:  Positive for rash (left arm, lower back and around her neck. Has used an eczema cream without benefit.).  Neurological: Negative.   Psychiatric/Behavioral: Negative.       Today's Vitals   09/12/22 1539  BP: 122/74  Pulse: 87  Temp: 98.4 F (36.9 C)  SpO2: 98%  Weight: 218 lb 12.8 oz (99.2 kg)  Height: 5\' 4"  (1.626 m)   Body mass index is 37.56 kg/m.  Wt Readings from Last 3 Encounters:  09/12/22 218 lb 12.8 oz (99.2 kg)  05/14/22 220 lb (99.8 kg)  01/10/22 229 lb (103.9 kg)    Objective:  Physical Exam Vitals reviewed.  Constitutional:      General: She is not in acute distress.    Appearance: Normal appearance. She is well-developed. She is obese.  Cardiovascular:     Rate and Rhythm: Normal rate and regular rhythm.     Pulses: Normal pulses.     Heart sounds: Normal heart sounds. No murmur heard. Pulmonary:     Effort: Pulmonary effort is normal. No respiratory distress.     Breath sounds: Normal breath sounds. No wheezing.  Chest:     Chest wall: No tenderness.  Musculoskeletal:        General: Normal range of motion.  Skin:    General: Skin is warm and dry.     Capillary Refill: Capillary refill takes less than 2 seconds.     Findings: No rash (scattered rash to left arm at different stages. Also dry scaly rash noted to right side of neck where her necklace lays).  Neurological:     General: No focal deficit present.     Mental Status: She is alert and oriented to person, place, and time.     Cranial Nerves: No cranial  nerve deficit.     Motor: No weakness.  Psychiatric:        Mood and Affect: Mood normal.        Behavior: Behavior normal.        Thought Content: Thought content normal.        Judgment: Judgment normal.         Assessment And Plan:     1. Essential hypertension Comments: Blood pressure is well controlled, continue current medications.  I have sent Rx for he Lisinopril/HCTZ - lisinopril-hydrochlorothiazide (ZESTORETIC) 20-12.5 MG tablet; Take 1 tablet by mouth daily.  Dispense: 90 tablet; Refill: 1  2. Diabetes mellitus type 2 in obese [facility]) Comments: Hgba1c is improving, continue Mounjaro, will increase pending lab results. - CMP14 + Anion Gap - Hemoglobin A1c  3. Dyslipidemia Comments: Cholesterol levels are stable, continue statin, tolerating well. - Lipid panel - CMP14 + Anion Gap  4. Vitamin D deficiency Will check vitamin D level and supplement as needed.    Also encouraged to spend 15 minutes in the sun daily.  - VITAMIN D 25 Hydroxy (Vit-D Deficiency, Fractures)  5. Hair loss Comments: She has hair thinning, will check for a metabolic cause. There is only a 3% chance of hair loss with Mounjaro - Vitamin B12 - TSH - Iron, TIBC and Ferritin Panel - CBC  6. Rash and nonspecific skin eruption - triamcinolone ointment (KENALOG) 0.5 %; Apply topically 2 (two) times daily.  Dispense: 30 g; Refill: 3 - Ambulatory referral to Dermatology  7. Irregular menses Comments: Will check for metabolic cause before checking FSH/LH. Discussed dz process of perimenopause/menopause.     Patient was given opportunity to ask questions. Patient verbalized understanding of the plan and was able to repeat key elements of the plan. All questions were answered to their satisfaction.  [name], FNP   I, [name], FNP, have reviewed all documentation for this visit. The documentation on 09/12/22 for the exam, diagnosis, procedures, and orders are all accurate and complete.   IF  YOU HAVE BEEN REFERRED TO A SPECIALIST, IT MAY TAKE 1-2 WEEKS TO SCHEDULE/PROCESS THE REFERRAL. IF YOU HAVE NOT HEARD FROM US/SPECIALIST IN TWO WEEKS, PLEASE GIVE [place] A CALL AT [phone].   THE PATIENT IS ENCOURAGED TO PRACTICE SOCIAL DISTANCING DUE TO THE COVID-19 PANDEMIC.

## 2022-09-12 NOTE — Patient Instructions (Signed)
Hypertension, Adult ?Hypertension is another name for high blood pressure. High blood pressure forces your heart to work harder to pump blood. This can cause problems over time. ?There are two numbers in a blood pressure reading. There is a top number (systolic) over a bottom number (diastolic). It is best to have a blood pressure that is below 120/80. ?What are the causes? ?The cause of this condition is not known. Some other conditions can lead to high blood pressure. ?What increases the risk? ?Some lifestyle factors can make you more likely to develop high blood pressure: ?Smoking. ?Not getting enough exercise or physical activity. ?Being overweight. ?Having too much fat, sugar, calories, or salt (sodium) in your diet. ?Drinking too much alcohol. ?Other risk factors include: ?Having any of these conditions: ?Heart disease. ?Diabetes. ?High cholesterol. ?Kidney disease. ?Obstructive sleep apnea. ?Having a family history of high blood pressure and high cholesterol. ?Age. The risk increases with age. ?Stress. ?What are the signs or symptoms? ?High blood pressure may not cause symptoms. Very high blood pressure (hypertensive crisis) may cause: ?Headache. ?Fast or uneven heartbeats (palpitations). ?Shortness of breath. ?Nosebleed. ?Vomiting or feeling like you may vomit (nauseous). ?Changes in how you see. ?Very bad chest pain. ?Feeling dizzy. ?Seizures. ?How is this treated? ?This condition is treated by making healthy lifestyle changes, such as: ?Eating healthy foods. ?Exercising more. ?Drinking less alcohol. ?Your doctor may prescribe medicine if lifestyle changes do not help enough and if: ?Your top number is above 130. ?Your bottom number is above 80. ?Your personal target blood pressure may vary. ?Follow these instructions at home: ?Eating and drinking ? ?If told, follow the DASH eating plan. To follow this plan: ?Fill one half of your plate at each meal with fruits and vegetables. ?Fill one fourth of your plate  at each meal with whole grains. Whole grains include whole-wheat pasta, brown rice, and whole-grain bread. ?Eat or drink low-fat dairy products, such as skim milk or low-fat yogurt. ?Fill one fourth of your plate at each meal with low-fat (lean) proteins. Low-fat proteins include fish, chicken without skin, eggs, beans, and tofu. ?Avoid fatty meat, cured and processed meat, or chicken with skin. ?Avoid pre-made or processed food. ?Limit the amount of salt in your diet to less than 1,500 mg each day. ?Do not drink alcohol if: ?Your doctor tells you not to drink. ?You are pregnant, may be pregnant, or are planning to become pregnant. ?If you drink alcohol: ?Limit how much you have to: ?0-1 drink a day for women. ?0-2 drinks a day for men. ?Know how much alcohol is in your drink. In the [place] one drink equals one 12 oz bottle of beer (355 mL), one 5 oz glass of wine (148 mL), or one 1? oz glass of hard liquor (44 mL). ?Lifestyle ? ?Work with your doctor to stay at a healthy weight or to lose weight. Ask your doctor what the best weight is for you. ?Get at least 30 minutes of exercise that causes your heart to beat faster (aerobic exercise) most days of the week. This may include walking, swimming, or biking. ?Get at least 30 minutes of exercise that strengthens your muscles (resistance exercise) at least 3 days a week. This may include lifting weights or doing Pilates. ?Do not smoke or use any products that contain nicotine or tobacco. If you need help quitting, ask your doctor. ?Check your blood pressure at home as told by your doctor. ?Keep all follow-up visits. ?Medicines ?Take over-the-counter and prescription medicines   only as told by your doctor. Follow directions carefully. ?Do not skip doses of blood pressure medicine. The medicine does not work as well if you skip doses. Skipping doses also puts you at risk for problems. ?Ask your doctor about side effects or reactions to medicines that you should watch  for. ?Contact a doctor if: ?You think you are having a reaction to the medicine you are taking. ?You have headaches that keep coming back. ?You feel dizzy. ?You have swelling in your ankles. ?You have trouble with your vision. ?Get help right away if: ?You get a very bad headache. ?You start to feel mixed up (confused). ?You feel weak or numb. ?You feel faint. ?You have very bad pain in your: ?Chest. ?Belly (abdomen). ?You vomit more than once. ?You have trouble breathing. ?These symptoms may be an emergency. Get help right away. Call 911. ?Do not wait to see if the symptoms will go away. ?Do not drive yourself to the hospital. ?Summary ?Hypertension is another name for high blood pressure. ?High blood pressure forces your heart to work harder to pump blood. ?For most people, a normal blood pressure is less than 120/80. ?Making healthy choices can help lower blood pressure. If your blood pressure does not get lower with healthy choices, you may need to take medicine. ?This information is not intended to replace advice given to you by your health care provider. Make sure you discuss any questions you have with your health care provider. ?Document Revised: 07/20/2021 Document Reviewed: 07/20/2021 ?Elsevier Patient Education ? 2023 [employer] ? ?

## 2022-09-13 ENCOUNTER — Other Ambulatory Visit: Payer: Self-pay | Admitting: Nurse Practitioner

## 2022-09-13 ENCOUNTER — Ambulatory Visit: Payer: Managed Care, Other (non HMO) | Admitting: Nurse Practitioner

## 2022-09-13 DIAGNOSIS — L659 Nonscarring hair loss, unspecified: Secondary | ICD-10-CM

## 2022-09-13 DIAGNOSIS — D508 Other iron deficiency anemias: Secondary | ICD-10-CM

## 2022-09-13 LAB — CBC
Hematocrit: 37.5 % (ref 34.0–46.6)
Hemoglobin: 11.7 g/dL (ref 11.1–15.9)
MCH: 25.9 pg — ABNORMAL LOW (ref 26.6–33.0)
MCHC: 31.2 g/dL — ABNORMAL LOW (ref 31.5–35.7)
MCV: 83 fL (ref 79–97)
Platelets: 475 10*3/uL — ABNORMAL HIGH (ref 150–450)
RBC: 4.51 x10E6/uL (ref 3.77–5.28)
RDW: 14.1 % (ref 11.7–15.4)
WBC: 5.7 10*3/uL (ref 3.4–10.8)

## 2022-09-13 LAB — CMP14 + ANION GAP
ALT: 16 IU/L (ref 0–32)
AST: 11 IU/L (ref 0–40)
Albumin/Globulin Ratio: 1.4 (ref 1.2–2.2)
Albumin: 4.1 g/dL (ref 3.9–4.9)
Alkaline Phosphatase: 120 IU/L (ref 44–121)
Anion Gap: 12 mmol/L (ref 10.0–18.0)
BUN/Creatinine Ratio: 14 (ref 9–23)
BUN: 15 mg/dL (ref 6–24)
Bilirubin Total: 0.2 mg/dL (ref 0.0–1.2)
CO2: 24 mmol/L (ref 20–29)
Calcium: 9.1 mg/dL (ref 8.7–10.2)
Chloride: 98 mmol/L (ref 96–106)
Creatinine, Ser: 1.08 mg/dL — ABNORMAL HIGH (ref 0.57–1.00)
Globulin, Total: 2.9 g/dL (ref 1.5–4.5)
Glucose: 189 mg/dL — ABNORMAL HIGH (ref 70–99)
Potassium: 4.6 mmol/L (ref 3.5–5.2)
Sodium: 134 mmol/L (ref 134–144)
Total Protein: 7 g/dL (ref 6.0–8.5)
eGFR: 63 mL/min/{1.73_m2} (ref >59)

## 2022-09-13 LAB — LIPID PANEL
Chol/HDL Ratio: 3.6 ratio (ref 0.0–4.4)
Cholesterol, Total: 136 mg/dL (ref 100–199)
HDL: 38 mg/dL — ABNORMAL LOW (ref >39)
LDL Chol Calc (NIH): 75 mg/dL (ref 0–99)
Triglycerides: 131 mg/dL (ref 0–149)
VLDL Cholesterol Cal: 23 mg/dL (ref 5–40)

## 2022-09-13 LAB — VITAMIN B12: Vitamin B-12: 1285 pg/mL — ABNORMAL HIGH (ref 232–1245)

## 2022-09-13 LAB — IRON,TIBC AND FERRITIN PANEL
Ferritin: 30 ng/mL (ref 15–150)
Iron Saturation: 9 % — CL (ref 15–55)
Iron: 34 ug/dL (ref 27–159)
Total Iron Binding Capacity: 371 ug/dL (ref 250–450)
UIBC: 337 ug/dL (ref 131–425)

## 2022-09-13 LAB — VITAMIN D 25 HYDROXY (VIT D DEFICIENCY, FRACTURES): Vit D, 25-Hydroxy: 41.4 ng/mL (ref 30.0–100.0)

## 2022-09-13 LAB — HEMOGLOBIN A1C
Est. average glucose Bld gHb Est-mCnc: 186 mg/dL
Hgb A1c MFr Bld: 8.1 % — ABNORMAL HIGH (ref 4.8–5.6)

## 2022-09-13 LAB — TSH: TSH: 1.97 u[IU]/mL (ref 0.450–4.500)

## 2022-09-13 MED ORDER — FERROUS SULFATE 325 (65 FE) MG PO TBEC: 325.0000 mg | DELAYED_RELEASE_TABLET | Freq: Two times a day (BID) | ORAL | 3 refills | Status: DC

## 2022-09-14 ENCOUNTER — Encounter: Payer: Self-pay | Admitting: Nurse Practitioner

## 2022-11-07 ENCOUNTER — Other Ambulatory Visit: Payer: Self-pay

## 2022-11-07 MED ORDER — MOUNJARO 10 MG/0.5ML ~~LOC~~ SOAJ: SUBCUTANEOUS | 0 refills | Status: DC

## 2022-11-12 ENCOUNTER — Other Ambulatory Visit: Payer: Self-pay | Admitting: Nurse Practitioner

## 2022-11-12 MED ORDER — MOUNJARO 10 MG/0.5ML ~~LOC~~ SOAJ
10.0000 mg | SUBCUTANEOUS | 0 refills | Status: DC
  Filled 2022-12-19: qty 6, 84d supply, fill #0

## 2022-12-10 ENCOUNTER — Encounter: Payer: Self-pay | Admitting: Nurse Practitioner

## 2022-12-13 ENCOUNTER — Telehealth: Payer: Self-pay

## 2022-12-13 MED ORDER — MOUNJARO 7.5 MG/0.5ML ~~LOC~~ SOAJ: 7.5000 mg | SUBCUTANEOUS | 0 refills | Status: DC

## 2022-12-13 NOTE — Telephone Encounter (Signed)
Patient called to state she is having difficulty getting Mounjaro. She states her usual pharmacy does not have it. She is going to try and go to a pharmacy in [place] to pick up her rx. She would like the next lower dose sent in, just in case she cannot get the current dose.   Rx sent as requested, approved by provider.

## 2022-12-18 ENCOUNTER — Other Ambulatory Visit (HOSPITAL_COMMUNITY): Payer: Self-pay

## 2022-12-19 ENCOUNTER — Other Ambulatory Visit (HOSPITAL_COMMUNITY): Payer: Self-pay

## 2022-12-20 ENCOUNTER — Other Ambulatory Visit (HOSPITAL_COMMUNITY): Payer: Self-pay

## 2022-12-27 ENCOUNTER — Other Ambulatory Visit: Payer: Self-pay | Admitting: Nurse Practitioner

## 2022-12-27 MED ORDER — IBUPROFEN 800 MG PO TABS: 800.0000 mg | ORAL_TABLET | Freq: Three times a day (TID) | ORAL | 2 refills | Status: DC | PRN

## 2023-01-15 ENCOUNTER — Ambulatory Visit (INDEPENDENT_AMBULATORY_CARE_PROVIDER_SITE_OTHER): Payer: Managed Care, Other (non HMO) | Admitting: Nurse Practitioner

## 2023-01-15 ENCOUNTER — Encounter: Payer: Self-pay | Admitting: Nurse Practitioner

## 2023-01-15 VITALS — BP 118/50 | HR 83 | Temp 98.1°F | Ht 64.0 in | Wt 217.2 lb

## 2023-01-15 DIAGNOSIS — Z0000 Encounter for general adult medical examination without abnormal findings: Secondary | ICD-10-CM

## 2023-01-15 DIAGNOSIS — E559 Vitamin D deficiency, unspecified: Secondary | ICD-10-CM

## 2023-01-15 DIAGNOSIS — E1169 Type 2 diabetes mellitus with other specified complication: Secondary | ICD-10-CM | POA: Diagnosis not present

## 2023-01-15 DIAGNOSIS — I10 Essential (primary) hypertension: Secondary | ICD-10-CM | POA: Diagnosis not present

## 2023-01-15 DIAGNOSIS — Z6837 Body mass index (BMI) 37.0-37.9, adult: Secondary | ICD-10-CM

## 2023-01-15 DIAGNOSIS — M25552 Pain in left hip: Secondary | ICD-10-CM

## 2023-01-15 DIAGNOSIS — E669 Obesity, unspecified: Secondary | ICD-10-CM

## 2023-01-15 DIAGNOSIS — E785 Hyperlipidemia, unspecified: Secondary | ICD-10-CM

## 2023-01-15 DIAGNOSIS — E6609 Other obesity due to excess calories: Secondary | ICD-10-CM

## 2023-01-15 MED ORDER — MOUNJARO 10 MG/0.5ML ~~LOC~~ SOAJ
10.0000 mg | SUBCUTANEOUS | 1 refills | Status: DC
Start: 2023-01-15 — End: 2023-05-21

## 2023-01-15 NOTE — Patient Instructions (Signed)
Here is the number for [facility]  [phone]

## 2023-01-15 NOTE — Progress Notes (Signed)
I,Sheena H Holbrook,acting as a Education administrator for Minette Brine, FNP.,have documented all relevant documentation on the behalf of Minette Brine, FNP,as directed by  Minette Brine, FNP while in the presence of Minette Brine, [place].   Subjective:     Patient ID: Kelsey Olson , female    DOB: 10-06-1971 , 52 y.o.   MRN: [id number]   Chief Complaint  Patient presents with   Annual Exam    HPI  Patient presents today for annual exam.  She reports having more pain with her menstrual cycle. She does not have a GYN. She will sometimes take pain medications.   Wt Readings from Last 3 Encounters: 01/15/23 : 217 lb 3.2 oz (98.5 kg) 09/12/22 : 218 lb 12.8 oz (99.2 kg) 05/14/22 : 220 lb (99.8 kg)    Diabetes She presents for her follow-up diabetic visit. She has type 2 diabetes mellitus. Her disease course has been stable. There are no hypoglycemic associated symptoms. There are no diabetic associated symptoms. Pertinent negatives for diabetes include no blurred vision and no chest pain. There are no hypoglycemic complications. Symptoms are stable. There are no diabetic complications. Risk factors for coronary artery disease include obesity and sedentary lifestyle. Current diabetic treatment includes oral agent (monotherapy). Her weight is stable. She is following a generally unhealthy diet. When asked about meal planning, she reported none. She has not had a previous visit with a dietitian. She rarely (due to pain to her left hip) participates in exercise. (Blood sugar 200-226, she ate some potato chips with her granddaughter last night and was 260.  ) She does not see a podiatrist.Eye exam is not current (She has been to the eye doctor - found to have tears to bilateral eyes and had laser to reattach - did not feel was diabetes related).  Hypertension This is a chronic problem. The current episode started more than 1 year ago. The problem has been gradually improving since onset. The problem is controlled.  Pertinent negatives include no anxiety, blurred vision, chest pain or shortness of breath. Risk factors for coronary artery disease include obesity and sedentary lifestyle. Past treatments include ACE inhibitors and diuretics. Compliance problems include exercise.  There is no history of angina. There is no history of chronic renal disease.  Hip Pain  The incident occurred more than 1 week ago. The pain is present in the left hip. The quality of the pain is described as aching. Pertinent negatives include no loss of motion. Treatments tried: she has been taking ibuprofen since Meloxicam was ineffective.     Past Medical History:  Diagnosis Date   Chronic back pain    Diabetes mellitus 10/16/2003   Former smoker 02/12/2014   Hypertension    Obesity    Tubal pregnancy    Wears glasses      Family History  Problem Relation Age of Onset   Heart disease Mother        CHF   Diabetes Mother    Kidney disease Mother        dialysis   COPD Father    Hepatitis C Father    Cancer Father        colon   Diabetes Sister    Hypertension Sister    Cancer Maternal Uncle        prostate   Heart disease Maternal Grandmother    Hypertension Brother    Diabetes Brother    Hypertension Brother    Breast cancer Neg Hx  Current Outpatient Medications:    atorvastatin (LIPITOR) 20 MG tablet, Take 1 tablet (20 mg total) by mouth at bedtime., Disp: 90 tablet, Rfl: 1   cholecalciferol (VITAMIN D3) 25 MCG (1000 UNIT) tablet, Take 1 tablet (1,000 Units total) by mouth daily., Disp: 30 tablet, Rfl: 2   ferrous sulfate 325 (65 FE) MG EC tablet, Take 1 tablet (325 mg total) by mouth 2 (two) times daily., Disp: 60 tablet, Rfl: 3   ibuprofen (ADVIL) 800 MG tablet, Take 1 tablet (800 mg total) by mouth every 8 (eight) hours as needed., Disp: 30 tablet, Rfl: 2   lisinopril-hydrochlorothiazide (ZESTORETIC) 20-12.5 MG tablet, Take 1 tablet by mouth daily., Disp: 90 tablet, Rfl: 1   tirzepatide (MOUNJARO)  10 MG/0.5ML Pen, Inject 10 mg into the skin once a week., Disp: 6 mL, Rfl: 1   triamcinolone ointment (KENALOG) 0.5 %, Apply topically 2 (two) times daily., Disp: 30 g, Rfl: 3   Allergies  Allergen Reactions   Percocet [Oxycodone-Acetaminophen] Itching and Other (See Comments)    Hallucinations      The patient states she uses none for birth control.  Patient's last menstrual period was 01/12/2023.. Negative for Dysmenorrhea and Negative for Menorrhagia. Negative for: breast discharge, breast lump(s), breast pain and breast self exam. Associated symptoms include abnormal vaginal bleeding. Pertinent negatives include abnormal bleeding (hematology), anxiety, decreased libido, depression, difficulty falling sleep, dyspareunia, history of infertility, nocturia, sexual dysfunction, sleep disturbances, urinary incontinence, urinary urgency, vaginal discharge and vaginal itching. Diet regular; she is eating more fruits and vegetables. She will fast one day a week and will eat fruits and vegetables on that day. She is not able to eat a lot at this time.The patient states her exercise level is none, continues to have hip pain.    The patient's tobacco use is:  Social History   Tobacco Use  Smoking Status Former   Packs/day: 0.50   Years: 24.00   Additional pack years: 0.00   Total pack years: 12.00   Types: Cigarettes   Quit date: 2015   Years since quitting: 9.2  Smokeless Tobacco Never   She has been exposed to passive smoke. The patient's alcohol use is:  Social History   Substance and Sexual Activity  Alcohol Use No   Additional information: Last pap 01/10/2022, next one scheduled for 01/10/2025.    Review of Systems  Constitutional: Negative.   HENT: Negative.    Eyes: Negative.  Negative for blurred vision.  Respiratory: Negative.  Negative for shortness of breath.   Cardiovascular: Negative.  Negative for chest pain.  Gastrointestinal: Negative.   Endocrine: Negative.    Genitourinary: Negative.   Musculoskeletal:  Negative for back pain.  Skin: Negative.   Allergic/Immunologic: Negative.   Neurological: Negative.   Hematological: Negative.   Psychiatric/Behavioral: Negative.       Today's Vitals   01/15/23 0909  BP: (!) 118/50  Pulse: 83  Temp: 98.1 F (36.7 C)  TempSrc: Oral  SpO2: 98%  Weight: 217 lb 3.2 oz (98.5 kg)  Height: 5\' 4"  (1.626 m)   Body mass index is 37.28 kg/m.   Objective:  Physical Exam Vitals reviewed.  Constitutional:      General: She is not in acute distress.    Appearance: Normal appearance. She is well-developed. She is obese.  HENT:     Head: Normocephalic and atraumatic.     Right Ear: Hearing, tympanic membrane, ear canal and external ear normal. There is no impacted cerumen.  Left Ear: Hearing, tympanic membrane, ear canal and external ear normal. There is no impacted cerumen.     Nose: Nose normal.     Mouth/Throat:     Mouth: Mucous membranes are moist.  Eyes:     General: Lids are normal.     Extraocular Movements: Extraocular movements intact.     Conjunctiva/sclera: Conjunctivae normal.     Pupils: Pupils are equal, round, and reactive to light.     Funduscopic exam:    Right eye: No papilledema.        Left eye: No papilledema.  Neck:     Thyroid: No thyroid mass.     Vascular: No carotid bruit.  Cardiovascular:     Rate and Rhythm: Normal rate and regular rhythm.     Pulses: Normal pulses.     Heart sounds: Normal heart sounds. No murmur heard. Pulmonary:     Effort: Pulmonary effort is normal. No respiratory distress.     Breath sounds: Normal breath sounds. No wheezing.  Chest:     Chest wall: No mass or tenderness.  Breasts:    Tanner Score is 5.     Right: Normal. No mass or tenderness.     Left: Normal. No mass or tenderness.  Abdominal:     General: Abdomen is flat. Bowel sounds are normal. There is no distension.     Palpations: Abdomen is soft.     Tenderness: There is no  abdominal tenderness.  Genitourinary:    Rectum: Guaiac result negative.  Musculoskeletal:        General: No swelling or tenderness. Normal range of motion.     Cervical back: Full passive range of motion without pain, normal range of motion and neck supple.     Right lower leg: No edema.     Left lower leg: No edema.  Lymphadenopathy:     Upper Body:     Right upper body: No supraclavicular, axillary or pectoral adenopathy.     Left upper body: No supraclavicular, axillary or pectoral adenopathy.  Skin:    General: Skin is warm and dry.     Capillary Refill: Capillary refill takes less than 2 seconds.  Neurological:     General: No focal deficit present.     Mental Status: She is alert and oriented to person, place, and time.     Cranial Nerves: No cranial nerve deficit.     Sensory: No sensory deficit.     Motor: No weakness.  Psychiatric:        Mood and Affect: Mood normal.        Behavior: Behavior normal.        Thought Content: Thought content normal.        Judgment: Judgment normal.         Assessment And Plan:     1. Adult general medical exam Behavior modifications discussed and diet history reviewed.   Pt will continue to exercise regularly and modify diet with low GI, plant based foods and decrease intake of processed foods.  Recommend intake of daily multivitamin, Vitamin D, and calcium.  Recommend mammogram and colonoscopy for preventive screenings, as well as recommend immunizations that include influenza, TDAP, and Shingles  2. Essential hypertension Comments: Blood pressure is well controlled, continue current medications - EKG 12-Lead - CBC - CMP14+EGFR  3. Dyslipidemia Comments: Cholesterol levels are stable, encouraged to increase intake of good cholesterol - Lipid panel  4. Vitamin D deficiency Will check vitamin D  level and supplement as needed.    Also encouraged to spend 15 minutes in the sun daily.  - VITAMIN D 25 Hydroxy (Vit-D  Deficiency, Fractures)  5. Left hip pain Comments: She is to go to [facility] for an xray and will refer to Orthopedics for further evaluation as this has been ongoing for several years. Old records indicate - DG Hip Unilat W OR W/O Pelvis 2-3 Views Right; Future - Ambulatory referral to Orthopedic Surgery  6. Type 2 diabetes mellitus with obesity Chronic, improving, confirmed dose of Mounjaro of 10 mg weekly Continue with current medications Encouraged to limit intake of sugary foods and drinks Encouraged to increase physical activity to 150 minutes per week Eye exam is up to date Suggestion is made for her to avoid simple carbohydrates from her diet including Cakes, Sweet Desserts, Ice Cream, Soda (diet and regular), Sweet Tea, Candies, Chips, Cookies, Store Bought Juices, Alcohol in Excess of 1-2 drinks a day, Artificial Sweeteners, Coffee Creamer, and "Sugar-free" Products. This will help patient to have more stable blood glucose  - Hemoglobin A1c - Microalbumin / Creatinine Urine Ratio - tirzepatide (MOUNJARO) 10 MG/0.5ML Pen; Inject 10 mg into the skin once a week.  Dispense: 6 mL; Refill: 1   Patient was given opportunity to ask questions. Patient verbalized understanding of the plan and was able to repeat key elements of the plan. All questions were answered to their satisfaction.   Minette Brine, FNP   I, Minette Brine, FNP, have reviewed all documentation for this visit. The documentation on 01/15/23 for the exam, diagnosis, procedures, and orders are all accurate and complete.   THE PATIENT IS ENCOURAGED TO PRACTICE SOCIAL DISTANCING DUE TO THE COVID-19 PANDEMIC.

## 2023-01-16 ENCOUNTER — Other Ambulatory Visit: Payer: Self-pay | Admitting: Nurse Practitioner

## 2023-01-16 LAB — CMP14+EGFR
ALT: 21 IU/L (ref 0–32)
AST: 15 IU/L (ref 0–40)
Albumin/Globulin Ratio: 1.5 (ref 1.2–2.2)
Albumin: 4.2 g/dL (ref 3.9–4.9)
Alkaline Phosphatase: 101 IU/L (ref 44–121)
BUN/Creatinine Ratio: 15 (ref 9–23)
BUN: 15 mg/dL (ref 6–24)
Bilirubin Total: 0.3 mg/dL (ref 0.0–1.2)
CO2: 25 mmol/L (ref 20–29)
Calcium: 9.2 mg/dL (ref 8.7–10.2)
Chloride: 102 mmol/L (ref 96–106)
Creatinine, Ser: 1 mg/dL (ref 0.57–1.00)
Globulin, Total: 2.8 g/dL (ref 1.5–4.5)
Glucose: 152 mg/dL — ABNORMAL HIGH (ref 70–99)
Potassium: 4.1 mmol/L (ref 3.5–5.2)
Sodium: 138 mmol/L (ref 134–144)
Total Protein: 7 g/dL (ref 6.0–8.5)
eGFR: 69 mL/min/{1.73_m2} (ref >59)

## 2023-01-16 LAB — CBC
Hematocrit: 36.7 % (ref 34.0–46.6)
Hemoglobin: 11.6 g/dL (ref 11.1–15.9)
MCH: 27 pg (ref 26.6–33.0)
MCHC: 31.6 g/dL (ref 31.5–35.7)
MCV: 85 fL (ref 79–97)
Platelets: 409 10*3/uL (ref 150–450)
RBC: 4.3 x10E6/uL (ref 3.77–5.28)
RDW: 13.7 % (ref 11.7–15.4)
WBC: 5.4 10*3/uL (ref 3.4–10.8)

## 2023-01-16 LAB — LIPID PANEL
Chol/HDL Ratio: 3.8 ratio (ref 0.0–4.4)
Cholesterol, Total: 141 mg/dL (ref 100–199)
HDL: 37 mg/dL — ABNORMAL LOW (ref >39)
LDL Chol Calc (NIH): 86 mg/dL (ref 0–99)
Triglycerides: 93 mg/dL (ref 0–149)
VLDL Cholesterol Cal: 18 mg/dL (ref 5–40)

## 2023-01-16 LAB — MICROALBUMIN / CREATININE URINE RATIO
Creatinine, Urine: 246 mg/dL
Microalb/Creat Ratio: 9 mg/g creat (ref 0–29)
Microalbumin, Urine: 21.6 ug/mL

## 2023-01-16 LAB — HEMOGLOBIN A1C
Est. average glucose Bld gHb Est-mCnc: 180 mg/dL
Hgb A1c MFr Bld: 7.9 % — ABNORMAL HIGH (ref 4.8–5.6)

## 2023-01-16 LAB — VITAMIN D 25 HYDROXY (VIT D DEFICIENCY, FRACTURES): Vit D, 25-Hydroxy: 48.3 ng/mL (ref 30.0–100.0)

## 2023-01-16 MED ORDER — ATORVASTATIN CALCIUM 40 MG PO TABS: 40.0000 mg | ORAL_TABLET | Freq: Every day | ORAL | 1 refills | Status: DC

## 2023-02-06 ENCOUNTER — Other Ambulatory Visit: Payer: Managed Care, Other (non HMO)

## 2023-03-15 ENCOUNTER — Other Ambulatory Visit (HOSPITAL_COMMUNITY): Payer: Self-pay

## 2023-03-15 ENCOUNTER — Other Ambulatory Visit: Payer: Self-pay

## 2023-03-15 MED ORDER — MOUNJARO 7.5 MG/0.5ML ~~LOC~~ SOAJ
7.5000 mg | SUBCUTANEOUS | 2 refills | Status: DC
  Filled 2023-03-15: qty 6, 84d supply, fill #0

## 2023-05-21 ENCOUNTER — Encounter: Payer: Self-pay | Admitting: Nurse Practitioner

## 2023-05-21 ENCOUNTER — Ambulatory Visit: Payer: Managed Care, Other (non HMO) | Admitting: Nurse Practitioner

## 2023-05-21 ENCOUNTER — Other Ambulatory Visit (HOSPITAL_COMMUNITY): Payer: Self-pay

## 2023-05-21 ENCOUNTER — Telehealth: Payer: Managed Care, Other (non HMO) | Admitting: Nurse Practitioner

## 2023-05-21 DIAGNOSIS — Z79899 Other long term (current) drug therapy: Secondary | ICD-10-CM | POA: Diagnosis not present

## 2023-05-21 DIAGNOSIS — R509 Fever, unspecified: Secondary | ICD-10-CM | POA: Insufficient documentation

## 2023-05-21 DIAGNOSIS — E669 Obesity, unspecified: Secondary | ICD-10-CM | POA: Diagnosis not present

## 2023-05-21 DIAGNOSIS — J029 Acute pharyngitis, unspecified: Secondary | ICD-10-CM | POA: Diagnosis not present

## 2023-05-21 DIAGNOSIS — I10 Essential (primary) hypertension: Secondary | ICD-10-CM | POA: Diagnosis not present

## 2023-05-21 DIAGNOSIS — Z7985 Long-term (current) use of injectable non-insulin antidiabetic drugs: Secondary | ICD-10-CM

## 2023-05-21 DIAGNOSIS — J3489 Other specified disorders of nose and nasal sinuses: Secondary | ICD-10-CM | POA: Diagnosis not present

## 2023-05-21 DIAGNOSIS — R0981 Nasal congestion: Secondary | ICD-10-CM | POA: Diagnosis not present

## 2023-05-21 DIAGNOSIS — E1169 Type 2 diabetes mellitus with other specified complication: Secondary | ICD-10-CM

## 2023-05-21 DIAGNOSIS — E559 Vitamin D deficiency, unspecified: Secondary | ICD-10-CM

## 2023-05-21 MED ORDER — MOUNJARO 10 MG/0.5ML ~~LOC~~ SOAJ
10.0000 mg | SUBCUTANEOUS | 3 refills | Status: DC
Start: 2023-05-21 — End: 2023-09-09
  Filled 2023-05-21: qty 2, 28d supply, fill #0
  Filled 2023-06-16 – 2023-06-27 (×2): qty 2, 28d supply, fill #1
  Filled 2023-07-20: qty 2, 28d supply, fill #2

## 2023-05-21 MED ORDER — FLUTICASONE PROPIONATE 50 MCG/ACT NA SUSP: 2.0000 | Freq: Every day | NASAL | 2 refills | Status: AC

## 2023-05-21 NOTE — Patient Instructions (Signed)
You can take oscicoccillinium over the counter for flu like symptoms Stay well hydrated with water and get plenty of rest Let us know what your covid test results are.

## 2023-05-21 NOTE — Assessment & Plan Note (Signed)
Will check vitamin D level and supplement as needed.    Also encouraged to spend 15 minutes in the sun daily.   

## 2023-05-21 NOTE — Assessment & Plan Note (Signed)
No blood pressure check today due to video visit. She will get her labs done in the next couple weeks.

## 2023-05-21 NOTE — Assessment & Plan Note (Signed)
Steroid nasal spray sent to pharmacy

## 2023-05-21 NOTE — Progress Notes (Deleted)
[name], CMA,acting as a Neurosurgeon for [name], FNP.,have documented all relevant documentation on the behalf of [name], FNP,as directed by  [name], FNP while in the presence of [name], FNP.  Subjective:  Patient ID: Kelsey Olson , female    DOB: Aug 05, 1971 , 52 y.o.   MRN: [id number]  No chief complaint on file.   HPI  Patient presents today for a BP, DM, and Chol check. Patient reports compliance with medications, patient denies any headache, chest pain or SOB. Patient has no other concerns today.     Past Medical History:  Diagnosis Date  . Chronic back pain   . Diabetes mellitus 10/16/2003  . Former smoker 02/12/2014  . Hypertension   . Obesity   . Tubal pregnancy   . Wears glasses      Family History  Problem Relation Age of Onset  . Heart disease Mother        CHF  . Diabetes Mother   . Kidney disease Mother        dialysis  . COPD Father   . Hepatitis C Father   . Cancer Father        colon  . Diabetes Sister   . Hypertension Sister   . Cancer Maternal Uncle        prostate  . Heart disease Maternal Grandmother   . Hypertension Brother   . Diabetes Brother   . Hypertension Brother   . Breast cancer Neg Hx      Current Outpatient Medications:  .  atorvastatin (LIPITOR) 40 MG tablet, Take 1 tablet (40 mg total) by mouth at bedtime., Disp: 90 tablet, Rfl: 1 .  cholecalciferol (VITAMIN D3) 25 MCG (1000 UNIT) tablet, Take 1 tablet (1,000 Units total) by mouth daily., Disp: 30 tablet, Rfl: 2 .  ferrous sulfate 325 (65 FE) MG EC tablet, Take 1 tablet (325 mg total) by mouth 2 (two) times daily., Disp: 60 tablet, Rfl: 3 .  ibuprofen (ADVIL) 800 MG tablet, Take 1 tablet (800 mg total) by mouth every 8 (eight) hours as needed., Disp: 30 tablet, Rfl: 2 .  lisinopril-hydrochlorothiazide (ZESTORETIC) 20-12.5 MG tablet, Take 1 tablet by mouth daily., Disp: 90 tablet, Rfl: 1 .  tirzepatide (MOUNJARO) 10 MG/0.5ML Pen, Inject 10 mg into the  skin once a week., Disp: 6 mL, Rfl: 1 .  tirzepatide (MOUNJARO) 7.5 MG/0.5ML Pen, Inject 7.5 mg (0.5 ml)into the skin once a week., Disp: 2 mL, Rfl: 2 .  triamcinolone ointment (KENALOG) 0.5 %, Apply topically 2 (two) times daily., Disp: 30 g, Rfl: 3   Allergies  Allergen Reactions  . Percocet [Oxycodone-Acetaminophen] Itching and Other (See Comments)    Hallucinations     Review of Systems   There were no vitals filed for this visit. There is no height or weight on file to calculate BMI.  Wt Readings from Last 3 Encounters:  01/15/23 217 lb 3.2 oz (98.5 kg)  09/12/22 218 lb 12.8 oz (99.2 kg)  05/14/22 220 lb (99.8 kg)    The 10-year ASCVD risk score (Arnett DK, et al., 2019) is: 7.3%   Values used to calculate the score:     Age: 42 years     Sex: Female     Is Non-Hispanic African American: Yes     Diabetic: Yes     Tobacco smoker: No     Systolic Blood Pressure: 118 mmHg     Is BP treated: Yes  HDL Cholesterol: 37 mg/dL     Total Cholesterol: 141 mg/dL  Objective:  Physical Exam      Assessment And Plan:  Essential hypertension  Dyslipidemia  Type 2 diabetes mellitus with obesity (HCC)    No follow-ups on file.  Patient was given opportunity to ask questions. Patient verbalized understanding of the plan and was able to repeat key elements of the plan. All questions were answered to their satisfaction.    Jeanell Sparrow, FNP, have reviewed all documentation for this visit. The documentation on 05/21/23 for the exam, diagnosis, procedures, and orders are all accurate and complete.   IF YOU HAVE BEEN REFERRED TO A SPECIALIST, IT MAY TAKE 1-2 WEEKS TO SCHEDULE/PROCESS THE REFERRAL. IF YOU HAVE NOT HEARD FROM US/SPECIALIST IN TWO WEEKS, PLEASE GIVE [place] A CALL AT [phone].

## 2023-05-21 NOTE — Assessment & Plan Note (Signed)
Blood sugars per patient have been elevated due to being on Mounjaro 7.5mg  due to stock will send the 10 mg to [facility] pharmacy

## 2023-05-21 NOTE — Progress Notes (Signed)
Virtual Visit via MyChart   This visit type was conducted due to national recommendations for restrictions regarding the COVID-19 Pandemic (e.g. social distancing) in an effort to limit this patient's exposure and mitigate transmission in our community.  Due to her co-morbid illnesses, this patient is at least at moderate risk for complications without adequate follow up.  This format is felt to be most appropriate for this patient at this time.  All issues noted in this document were discussed and addressed.  A limited physical exam was performed with this format.    This visit type was conducted due to national recommendations for restrictions regarding the COVID-19 Pandemic (e.g. social distancing) in an effort to limit this patient's exposure and mitigate transmission in our community.  Patients identity confirmed using two different identifiers.  This format is felt to be most appropriate for this patient at this time.  All issues noted in this document were discussed and addressed.  No physical exam was performed (except for noted visual exam findings with Video Visits).    Date:  05/21/2023   ID:  Kelsey Olson, DOB 05-29-1971, MRN [id number]  Patient Location:  Home - spoke with Kelsey Olson  Provider location:   Office    Chief Complaint:  cold symptoms  History of Present Illness:    Kelsey Olson is a 52 y.o. female who presents via video conferencing for a telehealth visit today.    The patient does have symptoms concerning for COVID-19 infection (fever, chills, cough, or new shortness of breath).   Patient presents virtual for fever (up to 99.3), sore throat, and stuffy nose. Patient reports her symptoms started last night. Patient reports she will take a at home covid test but she is not sure if they are good. She took an ibuprofen for a headache. No other family members are sick. She has not been out of town and she works from home. 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   She has one injection left of the Mounjaro 7.5 mg  Diabetes She presents for her follow-up diabetic visit. She has type 2 diabetes mellitus. Hypoglycemia symptoms include headaches. Pertinent negatives for diabetes include no chest pain and no fatigue. Risk factors for coronary artery disease include obesity and sedentary lifestyle. Current diabetic treatment includes oral agent (dual therapy). (Blood sugars are slightly elevated since does not have the ability to get 10 mg. )     Past Medical History:  Diagnosis Date   Chronic back pain    Diabetes mellitus 10/16/2003   Former smoker 02/12/2014   Hypertension    Obesity    Tubal pregnancy    Wears glasses    Past Surgical History:  Procedure Laterality Date   ECTOPIC PREGNANCY SURGERY  1999   right     Current Meds  Medication Sig   fluticasone (FLONASE) 50 MCG/ACT nasal spray Place 2 sprays into both nostrils daily.     Allergies:   Percocet [oxycodone-acetaminophen]   Social History   Tobacco Use   Smoking status: Former    Current packs/day: 0.00    Average packs/day: 0.5 packs/day for 24.0 years (12.0 ttl pk-yrs)    Types: Cigarettes    Start date: 23    Quit date: 2015    Years since quitting: 9.6   Smokeless tobacco: Never  Substance Use Topics   Alcohol use: No   Drug use: No     Family Hx: The patient's  family history includes COPD in her father; Cancer in her father and maternal uncle; Diabetes in her brother, mother, and sister; Heart disease in her maternal grandmother and mother; Hepatitis C in her father; Hypertension in her brother, brother, and sister; Kidney disease in her mother. There is no history of Breast cancer.  ROS:   Please see the history of present illness.    Review of Systems  Constitutional:  Positive for fever and malaise/fatigue. Negative for fatigue.  HENT:  Positive for congestion and sore throat.   Eyes: Negative.   Respiratory: Negative.     Cardiovascular:  Negative for chest pain.  Genitourinary: Negative.   Neurological:  Positive for headaches.  Psychiatric/Behavioral: Negative.      All other systems reviewed and are negative.   Labs/Other Tests and Data Reviewed:    Recent Labs: 09/12/2022: TSH 1.970 01/15/2023: ALT 21; BUN 15; Creatinine, Ser 1.00; Hemoglobin 11.6; Platelets 409; Potassium 4.1; Sodium 138   Recent Lipid Panel Lab Results  Component Value Date/Time   CHOL 141 01/15/2023 10:04 AM   TRIG 93 01/15/2023 10:04 AM   HDL 37 (L) 01/15/2023 10:04 AM   CHOLHDL 3.8 01/15/2023 10:04 AM   CHOLHDL 4.7 07/11/2017 08:58 AM   LDLCALC 86 01/15/2023 10:04 AM   LDLCALC 110 (H) 07/11/2017 08:58 AM    Wt Readings from Last 3 Encounters:  01/15/23 217 lb 3.2 oz (98.5 kg)  09/12/22 218 lb 12.8 oz (99.2 kg)  05/14/22 220 lb (99.8 kg)     Exam:    Vital Signs:  There were no vitals taken for this visit.    Physical Exam Vitals reviewed.  Constitutional:      General: She is not in acute distress.    Appearance: Normal appearance. She is obese.  Pulmonary:     Effort: Pulmonary effort is normal. No respiratory distress.  Neurological:     General: No focal deficit present.     Mental Status: She is alert and oriented to person, place, and time.     ASSESSMENT & PLAN:    Fever, unspecified fever cause  Sore throat -     CBC  Stuffy and runny nose Assessment & Plan: Advised her to take a home covid test, the one she has is expired however covid test have been deemed useable after the expiration date.   Orders: -     CBC  Nasal congestion Assessment & Plan: Steroid nasal spray sent to pharmacy  Orders: -     CBC  Type 2 diabetes mellitus with obesity (HCC) Assessment & Plan: Blood sugars per patient have been elevated due to being on Mounjaro 7.5mg  due to stock will send the 10 mg to [facility] pharmacy  Orders: Greggory Keen; Inject 10 mg into the skin once a week.  Dispense: 2 mL; Refill:  3 -     Lipid panel -     Hemoglobin A1c  Vitamin D deficiency Assessment & Plan: Will check vitamin D level and supplement as needed.    Also encouraged to spend 15 minutes in the sun daily.     Essential hypertension Assessment & Plan: No blood pressure check today due to video visit. She will get her labs done in the next couple weeks.   Orders: -     CMP14+EGFR  Other long term (current) drug therapy -     TSH  Other orders -     Fluticasone Propionate; Place 2 sprays into both  nostrils daily.  Dispense: 16 g; Refill: 2   COVID-19 Education: The signs and symptoms of COVID-19 were discussed with the patient and how to seek care for testing (follow up with PCP or arrange E-visit).  The importance of social distancing was discussed today.  Patient Risk:   After full review of this patients clinical status, I feel that they are at least moderate risk at this time.  Time:   Today, I have spent 9 minutes/ seconds with the patient with telehealth technology discussing above diagnoses.     Medication Adjustments/Labs and Tests Ordered: Current medicines are reviewed at length with the patient today.  Concerns regarding medicines are outlined above.   Tests Ordered: Orders Placed This Encounter  Procedures   CBC   Lipid panel   TSH   CMP14+EGFR   Hemoglobin A1c    Medication Changes: Meds ordered this encounter  Medications   fluticasone (FLONASE) 50 MCG/ACT nasal spray    Sig: Place 2 sprays into both nostrils daily.    Dispense:  16 g    Refill:  2   tirzepatide (MOUNJARO) 10 MG/0.5ML Pen    Sig: Inject 10 mg into the skin once a week.    Dispense:  2 mL    Refill:  3    Disposition:  Follow up in 3 month(s)  Signed, [name], FNP

## 2023-05-21 NOTE — Assessment & Plan Note (Signed)
Advised her to take a home covid test, the one she has is expired however covid test have been deemed useable after the expiration date.

## 2023-06-27 ENCOUNTER — Other Ambulatory Visit (HOSPITAL_COMMUNITY): Payer: Self-pay

## 2023-06-28 LAB — CBC: RBC: 4.67 (ref 3.87–5.11)

## 2023-06-28 LAB — IRON,TIBC AND FERRITIN PANEL
Ferritin: 77
Iron: 13
UIBC: 291

## 2023-06-28 LAB — CBC AND DIFFERENTIAL
HCT: 42 (ref 36–46)
Hemoglobin: 12.8 (ref 12.0–16.0)
WBC: 5.7

## 2023-06-28 LAB — VITAMIN D 25 HYDROXY (VIT D DEFICIENCY, FRACTURES): Vit D, 25-Hydroxy: 43.4

## 2023-06-28 LAB — COMPREHENSIVE METABOLIC PANEL
Albumin: 4.4 (ref 3.5–5.0)
Calcium: 9.8 (ref 8.7–10.7)
Globulin: 3.4
eGFR: 61

## 2023-06-28 LAB — HEPATIC FUNCTION PANEL
ALT: 19 U/L (ref 7–35)
AST: 13 (ref 13–35)
Alkaline Phosphatase: 109 (ref 25–125)

## 2023-06-28 LAB — BASIC METABOLIC PANEL
BUN: 15 (ref 4–21)
CO2: 22 (ref 13–22)
Chloride: 98 — AB (ref 99–108)
Creatinine: 1.1 (ref 0.5–1.1)
Glucose: 140
Potassium: 4.2 meq/L (ref 3.5–5.1)
Sodium: 139 (ref 137–147)

## 2023-06-28 LAB — LIPID PANEL
Cholesterol: 142 (ref 0–200)
HDL: 42 (ref 35–70)
LDL Cholesterol: 75
Triglycerides: 141 (ref 40–160)

## 2023-06-28 LAB — TSH: TSH: 1.28 (ref 0.41–5.90)

## 2023-07-30 ENCOUNTER — Other Ambulatory Visit: Payer: Self-pay | Admitting: Nurse Practitioner

## 2023-07-30 DIAGNOSIS — I10 Essential (primary) hypertension: Secondary | ICD-10-CM

## 2023-08-29 ENCOUNTER — Encounter: Payer: Self-pay | Admitting: Nurse Practitioner

## 2023-09-09 ENCOUNTER — Encounter: Payer: Self-pay | Admitting: Nurse Practitioner

## 2023-09-09 ENCOUNTER — Ambulatory Visit (INDEPENDENT_AMBULATORY_CARE_PROVIDER_SITE_OTHER): Payer: Medicaid Other | Admitting: Nurse Practitioner

## 2023-09-09 VITALS — BP 110/82 | HR 89 | Temp 98.1°F | Ht 64.0 in | Wt 219.0 lb

## 2023-09-09 DIAGNOSIS — Z2821 Immunization not carried out because of patient refusal: Secondary | ICD-10-CM | POA: Insufficient documentation

## 2023-09-09 DIAGNOSIS — E1169 Type 2 diabetes mellitus with other specified complication: Secondary | ICD-10-CM | POA: Diagnosis not present

## 2023-09-09 DIAGNOSIS — E785 Hyperlipidemia, unspecified: Secondary | ICD-10-CM | POA: Diagnosis not present

## 2023-09-09 DIAGNOSIS — E66812 Obesity, class 2: Secondary | ICD-10-CM | POA: Diagnosis not present

## 2023-09-09 DIAGNOSIS — I10 Essential (primary) hypertension: Secondary | ICD-10-CM | POA: Diagnosis not present

## 2023-09-09 DIAGNOSIS — Z6837 Body mass index (BMI) 37.0-37.9, adult: Secondary | ICD-10-CM

## 2023-09-09 DIAGNOSIS — Z1231 Encounter for screening mammogram for malignant neoplasm of breast: Secondary | ICD-10-CM

## 2023-09-09 DIAGNOSIS — E669 Obesity, unspecified: Secondary | ICD-10-CM | POA: Diagnosis not present

## 2023-09-09 DIAGNOSIS — M25512 Pain in left shoulder: Secondary | ICD-10-CM | POA: Diagnosis not present

## 2023-09-09 MED ORDER — OZEMPIC (0.25 OR 0.5 MG/DOSE) 2 MG/1.5ML ~~LOC~~ SOPN: 0.5000 mg | PEN_INJECTOR | SUBCUTANEOUS | 0 refills | Status: DC

## 2023-09-09 MED ORDER — OZEMPIC (0.25 OR 0.5 MG/DOSE) 2 MG/1.5ML ~~LOC~~ SOPN: 0.5000 mg | PEN_INJECTOR | SUBCUTANEOUS | 1 refills | Status: DC

## 2023-09-09 MED ORDER — ATORVASTATIN CALCIUM 40 MG PO TABS
40.0000 mg | ORAL_TABLET | Freq: Every day | ORAL | 1 refills | Status: DC
Start: 2023-09-09 — End: 2024-05-20

## 2023-09-09 NOTE — Assessment & Plan Note (Signed)
Blood pressure is controlled, continue current medications

## 2023-09-09 NOTE — Assessment & Plan Note (Signed)

## 2023-09-09 NOTE — Assessment & Plan Note (Signed)
Cholesterol levels are stable. Continue low fat diet

## 2023-09-09 NOTE — Assessment & Plan Note (Addendum)
Her Hgba1c was up to 8.8 with last check, will switch her to Ozempic as this may not need a PA due to her insurance change. Sample given. Discussed importance of not walking barefoot due to having diabetes and the risk of infection, osteomyelitis or loss of limbs

## 2023-09-09 NOTE — Assessment & Plan Note (Signed)
She is encouraged to strive for BMI less than 30 to decrease cardiac risk. Advised to aim for at least 150 minutes of exercise per week.

## 2023-09-09 NOTE — Progress Notes (Signed)
Kelsey Olson, CMA,acting as a Neurosurgeon for Kelsey Felts, FNP.,have documented all relevant documentation on the behalf of Kelsey Felts, FNP,as directed by  Kelsey Felts, FNP while in the presence of Kelsey Felts, FNP.  Subjective:  Patient ID: Kelsey Olson , female    DOB: 05-08-1971 , 52 y.o.   MRN: [id number]  Chief Complaint  Patient presents with   Hypertension    HPI  Patient presents today for a bp and dm follow up, Patient reports compliance with medication. Patient denies any chest pain, SOB, or headaches. Patient has no concerns today. Patient reports she hasn't been to the eye doctor or had a mammogram this year. She had laser surgery to her eyes due to having holes in her eyes not related to diabetes. She has been laid off. She has not had her [employer] for one month. She was told by her medicaid rep that Kelsey Olson is showing as her primary. She had labs done at her job in September with labs done. She has been without her medications for 2 weeks.      Past Medical History:  Diagnosis Date   Chronic back pain    Diabetes mellitus 10/16/2003   Former smoker 02/12/2014   Hypertension    Obesity    Tubal pregnancy    Wears glasses      Family History  Problem Relation Age of Onset   Heart disease Mother        CHF   Diabetes Mother    Kidney disease Mother        dialysis   COPD Father    Hepatitis C Father    Cancer Father        colon   Diabetes Sister    Hypertension Sister    Cancer Maternal Uncle        prostate   Heart disease Maternal Grandmother    Hypertension Brother    Diabetes Brother    Hypertension Brother    Breast cancer Neg Hx      Current Outpatient Medications:    cholecalciferol (VITAMIN D3) 25 MCG (1000 UNIT) tablet, Take 1 tablet (1,000 Units total) by mouth daily., Disp: 30 tablet, Rfl: 2   ferrous sulfate 325 (65 FE) MG EC tablet, Take 1 tablet (325 mg total) by mouth 2 (two) times daily., Disp: 60 tablet, Rfl: 3   fluticasone  (FLONASE) 50 MCG/ACT nasal spray, Place 2 sprays into both nostrils daily., Disp: 16 g, Rfl: 2   ibuprofen (ADVIL) 800 MG tablet, Take 1 tablet (800 mg total) by mouth every 8 (eight) hours as needed., Disp: 30 tablet, Rfl: 2   lisinopril-hydrochlorothiazide (ZESTORETIC) 20-12.5 MG tablet, Take 1 tablet by mouth once daily, Disp: 90 tablet, Rfl: 0   triamcinolone ointment (KENALOG) 0.5 %, Apply topically 2 (two) times daily., Disp: 30 g, Rfl: 3   atorvastatin (LIPITOR) 40 MG tablet, Take 1 tablet (40 mg total) by mouth at bedtime., Disp: 90 tablet, Rfl: 1   Semaglutide,0.25 or 0.5MG /DOS, (OZEMPIC, 0.25 OR 0.5 MG/DOSE,) 2 MG/1.5ML SOPN, Inject 0.5 mg into the skin once a week., Disp: 1.5 mL, Rfl: 0   Allergies  Allergen Reactions   Percocet [Oxycodone-Acetaminophen] Itching and Other (See Comments)    Hallucinations     Review of Systems  Constitutional: Negative.   HENT: Negative.    Eyes: Negative.   Respiratory: Negative.    Cardiovascular: Negative.   Gastrointestinal: Negative.   Neurological: Negative.   Psychiatric/Behavioral: Negative.  Today's Vitals   09/09/23 0841  BP: 110/82  Pulse: 89  Temp: 98.1 F (36.7 C)  TempSrc: Oral  Weight: 219 lb (99.3 kg)  Height: 5\' 4"  (1.626 m)  PainSc: 0-No pain   Body mass index is 37.59 kg/m.  Wt Readings from Last 3 Encounters:  09/09/23 219 lb (99.3 kg)  01/15/23 217 lb 3.2 oz (98.5 kg)  09/12/22 218 lb 12.8 oz (99.2 kg)    Objective:  Physical Exam Vitals reviewed.  Constitutional:      General: She is not in acute distress.    Appearance: Normal appearance. She is well-developed. She is obese.  Cardiovascular:     Rate and Rhythm: Normal rate and regular rhythm.     Pulses: Normal pulses.     Heart sounds: Normal heart sounds. No murmur heard. Pulmonary:     Effort: Pulmonary effort is normal. No respiratory distress.     Breath sounds: Normal breath sounds. No wheezing.  Chest:     Chest wall: No tenderness.   Musculoskeletal:        General: Normal range of motion.  Skin:    General: Skin is warm and dry.     Capillary Refill: Capillary refill takes less than 2 seconds.     Findings: No rash.  Neurological:     General: No focal deficit present.     Mental Status: She is alert and oriented to person, place, and time.     Cranial Nerves: No cranial nerve deficit.     Motor: No weakness.  Psychiatric:        Mood and Affect: Mood normal.        Behavior: Behavior normal.        Thought Content: Thought content normal.        Judgment: Judgment normal.      Title   Diabetic Foot Exam - detailed Date & Time: 09/09/2023 10:33 AM Diabetic Foot exam was performed with the following findings: Yes  Visual Foot Exam completed.: Yes  Is there a history of foot ulcer?: No Is there a foot ulcer now?: No Is there swelling?: No Is there elevated skin temperature?: No Is there abnormal foot shape?: No Is there a claw toe deformity?: No Are the toenails long?: No Are the toenails thick?: Yes Are the toenails ingrown?: No Is the skin thin, fragile, shiny and hairless?": No Normal Range of Motion?: Yes Is there foot or ankle muscle weakness?: No Do you have pain in calf while walking?: No Are the shoes appropriate in style and fit?: Yes Can the patient see the bottom of their feet?: Yes Pulse Foot Exam completed.: Yes   Right Posterior Tibialis: Present Left posterior Tibialis: Present   Right Dorsalis Pedis: Present Left Dorsalis Pedis: Present     Sensory Foot Exam Completed.: Yes Semmes-Weinstein Monofilament Test "+" means "has sensation" and "-" means "no sensation"   R Site 1-Great Toe: Pos L Site 1-Great Toe: Pos   R Site 4: Pos L Site 4: Pos   R Site 6: Pos L Site 6: Pos     Image components are not supported.   Image components are not supported. Image components are not supported.  [place] Comments Walks barefoot         Assessment And Plan:  Type 2 diabetes  mellitus with obesity (HCC) Assessment & Plan: Her Hgba1c was up to 8.8 with last check, will switch her to Ozempic as this may not need a PA  due to her insurance change. Sample given. Discussed importance of not walking barefoot due to having diabetes and the risk of infection, osteomyelitis or loss of limbs   Orders: -     Hemoglobin A1c -     Ambulatory referral to Ophthalmology -     Ozempic (0.25 or 0.5 MG/DOSE); Inject 0.5 mg into the skin once a week.  Dispense: 1.5 mL; Refill: 0  Dyslipidemia Assessment & Plan: Cholesterol levels are stable. Continue low fat diet.   Orders: -     Lipid panel -     Atorvastatin Calcium; Take 1 tablet (40 mg total) by mouth at bedtime.  Dispense: 90 tablet; Refill: 1  Essential hypertension Assessment & Plan: Blood pressure is controlled, continue current medications  Orders: -     Basic metabolic panel  Herpes zoster vaccination declined Assessment & Plan: Declines shingrix, educated on disease process and is aware if he changes his mind to notify office    Influenza vaccination declined Assessment & Plan: Patient declined influenza vaccination at this time. Patient is aware that influenza vaccine prevents illness in 70% of healthy people, and reduces hospitalizations to 30-70% in elderly. This vaccine is recommended annually. Education has been provided regarding the importance of this vaccine but patient still declined. Advised may receive this vaccine at local pharmacy or Health Dept.or vaccine clinic. Aware to provide a copy of the vaccination record if obtained from local pharmacy or Health Dept.  Pt is willing to accept risk associated with refusing vaccination.    COVID-19 vaccination declined Assessment & Plan: Declines covid 19 vaccine. Discussed risk of covid 54 and if she changes her mind about the vaccine to call the office. Education has been provided regarding the importance of this vaccine but patient still declined.  Advised may receive this vaccine at local pharmacy or Health Dept.or vaccine clinic. Aware to provide a copy of the vaccination record if obtained from local pharmacy or Health Dept.  Encouraged to take multivitamin, vitamin d, vitamin c and zinc to increase immune system. Aware can call office if would like to have vaccine here at office. Verbalized acceptance and understanding.    Class 2 severe obesity with serious comorbidity and body mass index (BMI) of 37.0 to 37.9 in adult, unspecified obesity type [facility]) Assessment & Plan: She is encouraged to strive for BMI less than 30 to decrease cardiac risk. Advised to aim for at least 150 minutes of exercise per week.    Encounter for screening mammogram for breast cancer -     Digital Screening Mammogram, Left and Right; Future  Acute pain of left shoulder -     DG Shoulder Left; Future  BMI 37.0-37.9, adult   Return for controlled DM check 4 months.  Patient was given opportunity to ask questions. Patient verbalized understanding of the plan and was able to repeat key elements of the plan. All questions were answered to their satisfaction.    Jeanell Sparrow, FNP, have reviewed all documentation for this visit. The documentation on 09/09/23 for the exam, diagnosis, procedures, and orders are all accurate and complete.   IF YOU HAVE BEEN REFERRED TO A SPECIALIST, IT MAY TAKE 1-2 WEEKS TO SCHEDULE/PROCESS THE REFERRAL. IF YOU HAVE NOT HEARD FROM US/SPECIALIST IN TWO WEEKS, PLEASE GIVE [place] A CALL AT [phone].

## 2023-09-09 NOTE — Assessment & Plan Note (Signed)

## 2023-09-09 NOTE — Assessment & Plan Note (Signed)
Declines shingrix, educated on disease process and is aware if he changes his mind to notify office  

## 2023-09-10 ENCOUNTER — Other Ambulatory Visit: Payer: Self-pay | Admitting: Nurse Practitioner

## 2023-09-10 DIAGNOSIS — I10 Essential (primary) hypertension: Secondary | ICD-10-CM

## 2023-09-10 LAB — BASIC METABOLIC PANEL
BUN/Creatinine Ratio: 10 (ref 9–23)
BUN: 12 mg/dL (ref 6–24)
CO2: 24 mmol/L (ref 20–29)
Calcium: 9.6 mg/dL (ref 8.7–10.2)
Chloride: 99 mmol/L (ref 96–106)
Creatinine, Ser: 1.16 mg/dL — ABNORMAL HIGH (ref 0.57–1.00)
Glucose: 210 mg/dL — ABNORMAL HIGH (ref 70–99)
Potassium: 4.8 mmol/L (ref 3.5–5.2)
Sodium: 138 mmol/L (ref 134–144)
eGFR: 57 mL/min/{1.73_m2} — ABNORMAL LOW (ref >59)

## 2023-09-10 LAB — HEMOGLOBIN A1C
Est. average glucose Bld gHb Est-mCnc: 206 mg/dL
Hgb A1c MFr Bld: 8.8 % — ABNORMAL HIGH (ref 4.8–5.6)

## 2023-09-10 LAB — LIPID PANEL
Chol/HDL Ratio: 4.1 {ratio} (ref 0.0–4.4)
Cholesterol, Total: 151 mg/dL (ref 100–199)
HDL: 37 mg/dL — ABNORMAL LOW (ref >39)
LDL Chol Calc (NIH): 89 mg/dL (ref 0–99)
Triglycerides: 138 mg/dL (ref 0–149)
VLDL Cholesterol Cal: 25 mg/dL (ref 5–40)

## 2023-09-11 ENCOUNTER — Ambulatory Visit
Admission: RE | Admit: 2023-09-11 | Discharge: 2023-09-11 | Disposition: A | Discharge status: Home / Self Care | Payer: Medicaid Other | Source: Ambulatory Visit | Attending: Nurse Practitioner | Admitting: Nurse Practitioner

## 2023-09-11 ENCOUNTER — Encounter: Payer: Self-pay | Admitting: Nurse Practitioner

## 2023-09-11 DIAGNOSIS — M25512 Pain in left shoulder: Secondary | ICD-10-CM

## 2023-09-11 MED ORDER — LISINOPRIL-HYDROCHLOROTHIAZIDE 20-12.5 MG PO TABS: 1.0000 | ORAL_TABLET | Freq: Every day | ORAL | 0 refills | Status: DC

## 2023-09-19 ENCOUNTER — Other Ambulatory Visit: Payer: Self-pay | Admitting: Nurse Practitioner

## 2023-09-19 DIAGNOSIS — R21 Rash and other nonspecific skin eruption: Secondary | ICD-10-CM

## 2023-10-04 ENCOUNTER — Other Ambulatory Visit (HOSPITAL_COMMUNITY): Payer: Self-pay

## 2023-10-04 ENCOUNTER — Other Ambulatory Visit: Payer: Self-pay | Admitting: Nurse Practitioner

## 2023-10-04 DIAGNOSIS — E1169 Type 2 diabetes mellitus with other specified complication: Secondary | ICD-10-CM

## 2023-10-04 MED ORDER — OZEMPIC (0.25 OR 0.5 MG/DOSE) 2 MG/3ML ~~LOC~~ SOPN
0.5000 mg | PEN_INJECTOR | SUBCUTANEOUS | 0 refills | Status: DC
  Filled 2023-10-04 – 2023-11-08 (×4): qty 3, 28d supply, fill #0

## 2023-10-11 ENCOUNTER — Other Ambulatory Visit (HOSPITAL_COMMUNITY): Payer: Self-pay

## 2023-10-14 ENCOUNTER — Encounter (HOSPITAL_COMMUNITY): Payer: Self-pay

## 2023-10-14 ENCOUNTER — Other Ambulatory Visit (HOSPITAL_COMMUNITY): Payer: Self-pay

## 2023-10-29 ENCOUNTER — Encounter: Payer: Self-pay | Admitting: Nurse Practitioner

## 2023-11-06 ENCOUNTER — Other Ambulatory Visit: Payer: Self-pay | Admitting: Nurse Practitioner

## 2023-11-06 DIAGNOSIS — L659 Nonscarring hair loss, unspecified: Secondary | ICD-10-CM

## 2023-11-06 DIAGNOSIS — D508 Other iron deficiency anemias: Secondary | ICD-10-CM

## 2023-11-08 ENCOUNTER — Other Ambulatory Visit (HOSPITAL_COMMUNITY): Payer: Self-pay

## 2023-11-08 ENCOUNTER — Telehealth: Payer: Self-pay

## 2023-11-08 NOTE — Telephone Encounter (Signed)
PA for ozempic sent to plan.

## 2023-12-05 ENCOUNTER — Other Ambulatory Visit: Payer: Self-pay | Admitting: Nurse Practitioner

## 2023-12-05 DIAGNOSIS — E1169 Type 2 diabetes mellitus with other specified complication: Secondary | ICD-10-CM

## 2023-12-06 ENCOUNTER — Other Ambulatory Visit (HOSPITAL_COMMUNITY): Payer: Self-pay

## 2023-12-06 MED ORDER — OZEMPIC (0.25 OR 0.5 MG/DOSE) 2 MG/3ML ~~LOC~~ SOPN
0.5000 mg | PEN_INJECTOR | SUBCUTANEOUS | 0 refills | Status: DC
  Filled 2023-12-06: qty 3, 28d supply, fill #0

## 2023-12-13 ENCOUNTER — Other Ambulatory Visit (HOSPITAL_COMMUNITY): Payer: Self-pay

## 2023-12-13 DIAGNOSIS — S46012A Strain of muscle(s) and tendon(s) of the rotator cuff of left shoulder, initial encounter: Secondary | ICD-10-CM | POA: Diagnosis not present

## 2023-12-13 MED ORDER — MELOXICAM 7.5 MG PO TABS
7.5000 mg | ORAL_TABLET | Freq: Two times a day (BID) | ORAL | 0 refills | Status: DC
  Filled 2023-12-13: qty 60, 30d supply, fill #0

## 2023-12-13 MED ORDER — METHOCARBAMOL 500 MG PO TABS
500.0000 mg | ORAL_TABLET | Freq: Three times a day (TID) | ORAL | 1 refills | Status: DC
  Filled 2023-12-13: qty 42, 14d supply, fill #0
  Filled 2023-12-27: qty 42, 14d supply, fill #1

## 2024-01-01 DIAGNOSIS — M25512 Pain in left shoulder: Secondary | ICD-10-CM | POA: Diagnosis not present

## 2024-01-01 DIAGNOSIS — M25612 Stiffness of left shoulder, not elsewhere classified: Secondary | ICD-10-CM | POA: Diagnosis not present

## 2024-01-04 ENCOUNTER — Other Ambulatory Visit: Payer: Self-pay | Admitting: Nurse Practitioner

## 2024-01-04 DIAGNOSIS — E1169 Type 2 diabetes mellitus with other specified complication: Secondary | ICD-10-CM

## 2024-01-06 ENCOUNTER — Other Ambulatory Visit (HOSPITAL_COMMUNITY): Payer: Self-pay

## 2024-01-06 DIAGNOSIS — M25512 Pain in left shoulder: Secondary | ICD-10-CM | POA: Diagnosis not present

## 2024-01-06 MED ORDER — OZEMPIC (0.25 OR 0.5 MG/DOSE) 2 MG/3ML ~~LOC~~ SOPN
0.5000 mg | PEN_INJECTOR | SUBCUTANEOUS | 0 refills | Status: DC
  Filled 2024-01-06: qty 3, 28d supply, fill #0

## 2024-01-09 ENCOUNTER — Other Ambulatory Visit (HOSPITAL_COMMUNITY): Payer: Self-pay

## 2024-01-09 DIAGNOSIS — M7502 Adhesive capsulitis of left shoulder: Secondary | ICD-10-CM | POA: Diagnosis not present

## 2024-01-09 DIAGNOSIS — S46012D Strain of muscle(s) and tendon(s) of the rotator cuff of left shoulder, subsequent encounter: Secondary | ICD-10-CM | POA: Diagnosis not present

## 2024-01-09 MED ORDER — MELOXICAM 7.5 MG PO TABS
7.5000 mg | ORAL_TABLET | Freq: Two times a day (BID) | ORAL | 0 refills | Status: DC | PRN
  Filled 2024-01-09: qty 60, 30d supply, fill #0

## 2024-01-10 DIAGNOSIS — M25512 Pain in left shoulder: Secondary | ICD-10-CM | POA: Diagnosis not present

## 2024-01-16 ENCOUNTER — Encounter: Payer: Self-pay | Admitting: Nurse Practitioner

## 2024-01-16 ENCOUNTER — Ambulatory Visit (INDEPENDENT_AMBULATORY_CARE_PROVIDER_SITE_OTHER): Payer: Self-pay | Admitting: Nurse Practitioner

## 2024-01-16 VITALS — BP 130/80 | HR 87 | Temp 98.7°F | Ht 64.0 in | Wt 220.6 lb

## 2024-01-16 DIAGNOSIS — M25612 Stiffness of left shoulder, not elsewhere classified: Secondary | ICD-10-CM | POA: Diagnosis not present

## 2024-01-16 DIAGNOSIS — E669 Obesity, unspecified: Secondary | ICD-10-CM | POA: Diagnosis not present

## 2024-01-16 DIAGNOSIS — M7502 Adhesive capsulitis of left shoulder: Secondary | ICD-10-CM | POA: Insufficient documentation

## 2024-01-16 DIAGNOSIS — Z0000 Encounter for general adult medical examination without abnormal findings: Secondary | ICD-10-CM | POA: Insufficient documentation

## 2024-01-16 DIAGNOSIS — Z79899 Other long term (current) drug therapy: Secondary | ICD-10-CM

## 2024-01-16 DIAGNOSIS — E559 Vitamin D deficiency, unspecified: Secondary | ICD-10-CM | POA: Diagnosis not present

## 2024-01-16 DIAGNOSIS — Z2821 Immunization not carried out because of patient refusal: Secondary | ICD-10-CM | POA: Diagnosis not present

## 2024-01-16 DIAGNOSIS — E1159 Type 2 diabetes mellitus with other circulatory complications: Secondary | ICD-10-CM

## 2024-01-16 DIAGNOSIS — E1169 Type 2 diabetes mellitus with other specified complication: Secondary | ICD-10-CM | POA: Diagnosis not present

## 2024-01-16 DIAGNOSIS — E785 Hyperlipidemia, unspecified: Secondary | ICD-10-CM

## 2024-01-16 DIAGNOSIS — M25512 Pain in left shoulder: Secondary | ICD-10-CM | POA: Diagnosis not present

## 2024-01-16 DIAGNOSIS — I152 Hypertension secondary to endocrine disorders: Secondary | ICD-10-CM | POA: Diagnosis not present

## 2024-01-16 DIAGNOSIS — I10 Essential (primary) hypertension: Secondary | ICD-10-CM | POA: Diagnosis not present

## 2024-01-16 DIAGNOSIS — Z1231 Encounter for screening mammogram for malignant neoplasm of breast: Secondary | ICD-10-CM | POA: Insufficient documentation

## 2024-01-16 LAB — POCT URINALYSIS DIP (CLINITEK)
Bilirubin, UA: NEGATIVE
Blood, UA: NEGATIVE
Glucose, UA: 500 mg/dL — AB
Ketones, POC UA: NEGATIVE mg/dL
Leukocytes, UA: NEGATIVE
Nitrite, UA: NEGATIVE
Spec Grav, UA: 1.025 (ref 1.010–1.025)
Urobilinogen, UA: 0.2 U/dL
pH, UA: 6 (ref 5.0–8.0)

## 2024-01-16 MED ORDER — DAPAGLIFLOZIN PROPANEDIOL 10 MG PO TABS: 10.0000 mg | ORAL_TABLET | Freq: Every day | ORAL | Status: DC

## 2024-01-16 MED ORDER — SEMAGLUTIDE (1 MG/DOSE) 4 MG/3ML ~~LOC~~ SOPN
1.0000 mg | PEN_INJECTOR | SUBCUTANEOUS | 1 refills | Status: DC
Start: 2024-01-16 — End: 2024-01-20

## 2024-01-16 NOTE — Assessment & Plan Note (Signed)

## 2024-01-16 NOTE — Assessment & Plan Note (Signed)
 Pt instructed on Self Breast Exam.According to ACOG guidelines Women aged 52 and older are recommended to get an annual mammogram. Form completed and given to patient contact the [facility] for appointment scheduling.  Pt encouraged to get annual mammogram

## 2024-01-16 NOTE — Progress Notes (Signed)
 [name], CMA,acting as a Neurosurgeon for [name], FNP.,have documented all relevant documentation on the behalf of [name], FNP,as directed by  [name], FNP while in the presence of [name], FNP.  Subjective:    Patient ID: Kelsey Olson , female    DOB: 11/03/1970 , 53 y.o.   MRN: [id number]  Chief Complaint  Patient presents with   Annual Exam    HPI  Patient presents today for HM, Patient reports compliance with medication. Patient denies any chest pain, SOB, or headaches. Patient has no concerns today. She is up to date on her immunizations, is scheduled for her eye exam in May of this year.  We are ordering a mammogram today.  She is checking her feet regularly for wounds, not walking barefoot.   Feels that the ozempic is not as effective as monjaro.  She takes her blood sugar twice a day, morning and afternoon, she is still getting numbers in the 200 range.  She is still taking the medication as prescribed.  She states that she does feel as hungry as when she first started the medication.  She is drinking water, 6 sixteen oz bottle a day.  Feels that she is eating enough protein, eats fruits and greens.  Is exercising now with walking a mile 3 times a week for the last 2 months.  Limited with frozen shoulder, seeing ortho for correction, had injected last week.      Past Medical History:  Diagnosis Date   Chronic back pain    Diabetes mellitus 10/16/2003   Former smoker 02/12/2014   Hypertension    Obesity    Tubal pregnancy    Wears glasses      Family History  Problem Relation Age of Onset   Heart disease Mother        CHF   Diabetes Mother    Kidney disease Mother        dialysis   COPD Father    Hepatitis C Father    Cancer Father        colon   Diabetes Sister    Hypertension Sister    Cancer Maternal Uncle        prostate   Heart disease Maternal Grandmother    Hypertension Brother    Diabetes Brother    Hypertension Brother     Breast cancer Neg Hx      Current Outpatient Medications:    atorvastatin (LIPITOR) 40 MG tablet, Take 1 tablet (40 mg total) by mouth at bedtime., Disp: 90 tablet, Rfl: 1   cholecalciferol (VITAMIN D3) 25 MCG (1000 UNIT) tablet, Take 1 tablet (1,000 Units total) by mouth daily., Disp: 30 tablet, Rfl: 2   dapagliflozin propanediol (FARXIGA) 10 MG TABS tablet, Take 1 tablet (10 mg total) by mouth daily before breakfast., Disp: , Rfl:    ferrous sulfate 325 (65 FE) MG EC tablet, Take 1 tablet by mouth twice daily, Disp: 60 tablet, Rfl: 0   fluticasone (FLONASE) 50 MCG/ACT nasal spray, Place 2 sprays into both nostrils daily., Disp: 16 g, Rfl: 2   ibuprofen (ADVIL) 800 MG tablet, Take 1 tablet (800 mg total) by mouth every 8 (eight) hours as needed., Disp: 30 tablet, Rfl: 2   lisinopril-hydrochlorothiazide (ZESTORETIC) 20-12.5 MG tablet, Take 1 tablet by mouth daily., Disp: 90 tablet, Rfl: 0   meloxicam (MOBIC) 7.5 MG tablet, Take 1 tablet (7.5 mg total) by mouth 2 (two) times daily., Disp: 60 tablet, Rfl: 0  meloxicam (MOBIC) 7.5 MG tablet, Take 1 tablet (7.5 mg total) by mouth 2 (two) times daily as needed for moderate pain., Disp: 60 tablet, Rfl: 0   methocarbamol (ROBAXIN) 500 MG tablet, Take 1 tablet (500 mg total) by mouth 3 (three) times daily for 14 days., Disp: 42 tablet, Rfl: 1   Semaglutide, 1 MG/DOSE, 4 MG/3ML SOPN, Inject 1 mg into the skin once a week., Disp: 9 mL, Rfl: 1   triamcinolone ointment (KENALOG) 0.5 %, APPLY  OINTMENT TOPICALLY TWICE DAILY, Disp: 30 g, Rfl: 0   Allergies  Allergen Reactions   Percocet [Oxycodone-Acetaminophen] Itching and Other (See Comments)    Hallucinations      The patient states she uses none for birth control. Patient's last menstrual period was 12/12/2023 (approximate).. Positive for Menorrhagia. Negative for: breast discharge, breast lump(s), breast pain and breast self exam. Associated symptoms include abnormal vaginal bleeding. Pertinent  negatives include abnormal bleeding (hematology), anxiety, decreased libido, depression, difficulty falling sleep, dyspareunia, history of infertility, nocturia, sexual dysfunction, sleep disturbances, urinary incontinence, urinary urgency, vaginal discharge and vaginal itching. Diet regular.The patient states her exercise level is    . The patient's tobacco use is:  Social History   Tobacco Use  Smoking Status Former   Current packs/day: 0.00   Average packs/day: 0.5 packs/day for 24.0 years (12.0 ttl pk-yrs)   Types: Cigarettes   Start date: 87   Quit date: 2015   Years since quitting: 10.2  Smokeless Tobacco Never  . She has been exposed to passive smoke. The patient's alcohol use is:  Social History   Substance and Sexual Activity  Alcohol Use No  . Additional information: Last pap 12/21/2021, next one scheduled for 2027.    Review of Systems  Constitutional: Negative.   HENT: Negative.    Eyes: Negative.   Respiratory: Negative.    Cardiovascular: Negative.   Gastrointestinal: Negative.   Endocrine: Negative.   Genitourinary: Negative.   Musculoskeletal:  Positive for arthralgias (left shoulder pain, left frozen shoulder).  Skin: Negative.   Allergic/Immunologic: Negative.   Neurological: Negative.   Psychiatric/Behavioral: Negative.    All other systems reviewed and are negative.    Today's Vitals   01/16/24 0856  BP: 130/80  Pulse: 87  Temp: 98.7 F (37.1 C)  TempSrc: Oral  Weight: 220 lb 9.6 oz (100.1 kg)  Height: 5\' 4"  (1.626 m)  PainSc: 6   PainLoc: Shoulder   Body mass index is 37.87 kg/m.  Wt Readings from Last 3 Encounters:  01/16/24 220 lb 9.6 oz (100.1 kg)  09/09/23 219 lb (99.3 kg)  01/15/23 217 lb 3.2 oz (98.5 kg)     Objective:  Physical Exam Vitals reviewed. Exam conducted with a chaperone present.  Constitutional:      Appearance: Normal appearance. She is obese.  HENT:     Head: Normocephalic and atraumatic.     Right Ear: External  ear normal.     Left Ear: External ear normal.     Nose: Nose normal.     Mouth/Throat:     Mouth: Mucous membranes are moist.     Pharynx: Oropharynx is clear.  Eyes:     Conjunctiva/sclera: Conjunctivae normal.     Pupils: Pupils are equal, round, and reactive to light.  Cardiovascular:     Rate and Rhythm: Normal rate and regular rhythm.     Pulses: Normal pulses.     Heart sounds: Normal heart sounds.  Pulmonary:     Effort:  Pulmonary effort is normal.     Breath sounds: Normal breath sounds.  Abdominal:     General: Bowel sounds are normal.     Palpations: Abdomen is soft.  Musculoskeletal:        General: Tenderness (left shoulder pain) present.     Cervical back: Normal range of motion.  Skin:    General: Skin is warm and dry.     Capillary Refill: Capillary refill takes 2 to 3 seconds.  Neurological:     General: No focal deficit present.     Mental Status: She is alert and oriented to person, place, and time. Mental status is at baseline.  Psychiatric:        Mood and Affect: Mood normal.        Behavior: Behavior normal.        Thought Content: Thought content normal.         Assessment And Plan:     Encounter for annual health examination Assessment & Plan: Eye exam scheduled, mammogram ordered. Pneumonia, COVID vaccines declined.  Diabetic kidney evaluation complete with labs today along with HgbA1c.     Obesity, diabetes, and hypertension syndrome (HCC) Assessment & Plan: She had to change to Ozempic due to insurance purposes - she had been on [place].  Farxiga added to decrease blood sugars, ozempic increased to 1mg discussed side effects and advised to hold medication if has vomiting/diarrhea or not feeling well.  Stable BP at top of borderline.  Patient instructed to monitor BP with adding Farxiga to medication regime for diabetes blood glucose control. EKG done NSR HR 80.   Orders: -     EKG 12-Lead -     POCT URINALYSIS DIP (CLINITEK) -      Microalbumin / creatinine urine ratio -     CMP14+EGFR -     Hemoglobin A1c -     Semaglutide (1 MG/DOSE); Inject 1 mg into the skin once a week.  Dispense: 9 mL; Refill: 1 -     Dapagliflozin Propanediol; Take 1 tablet (10 mg total) by mouth daily before breakfast.  Dyslipidemia Assessment & Plan: Cholesterol levels are stable. Continue low fat diet.   Orders: -     CMP14+EGFR -     Lipid panel  Vitamin D deficiency Assessment & Plan: Will check vitamin D level and supplement as needed.    Also encouraged to spend 15 minutes in the sun daily.    Orders: -     VITAMIN D 25 Hydroxy (Vit-D Deficiency, Fractures)  COVID-19 vaccination declined Assessment & Plan: Declines covid 19 vaccine. Discussed risk of covid 50 and if she changes her mind about the vaccine to call the office. Education has been provided regarding the importance of this vaccine but patient still declined. Advised may receive this vaccine at local pharmacy or Health Dept.or vaccine clinic. Aware to provide a copy of the vaccination record if obtained from local pharmacy or Health Dept.  Encouraged to take multivitamin, vitamin d, vitamin c and zinc to increase immune system. Aware can call office if would like to have vaccine here at office. Verbalized acceptance and understanding.    Herpes zoster vaccination declined Assessment & Plan: Declines shingrix, educated on disease process and is aware if he changes his mind to notify office    Other long term (current) drug therapy -     CBC with Differential/Platelet  Encounter for screening mammogram for breast cancer Assessment & Plan: Pt instructed on Self Breast Exam.According to  ACOG guidelines Women aged 75 and older are recommended to get an annual mammogram. Form completed and given to patient contact the [facility] for appointment scheduling.  Pt encouraged to get annual mammogram   Orders: -     Digital Screening Mammogram, Left and Right;  Future  Adhesive capsulitis of left shoulder Assessment & Plan: Continue ortho therapies for left frozen shoulder, she does have decreased range of motion and discomfort with mobility.    Return for 1 year physical, controlled DM check 4 months. Patient was given opportunity to ask questions. Patient verbalized understanding of the plan and was able to repeat key elements of the plan. All questions were answered to their satisfaction.   [name], FNP  I have reviewed this encounter including the documentation in this note and/or discussed this patient with the Ezra Sites, FNP student. I am certifying that I agree with the content of this note as the primary care nurse practitioner.  [name], DNP, FNP-BC  I, [name], FNP, have reviewed all documentation for this visit. The documentation on 01/16/24 for the exam, diagnosis, procedures, and orders are all accurate and complete.

## 2024-01-16 NOTE — Assessment & Plan Note (Addendum)
 Continue ortho therapies for left frozen shoulder, she does have decreased range of motion and discomfort with mobility.

## 2024-01-16 NOTE — Assessment & Plan Note (Signed)
 Declines shingrix, educated on disease process and is aware if he changes his mind to notify office

## 2024-01-16 NOTE — Assessment & Plan Note (Signed)
Cholesterol levels are stable. Continue low fat diet

## 2024-01-16 NOTE — Assessment & Plan Note (Addendum)
 Eye exam scheduled, mammogram ordered. Pneumonia, COVID vaccines declined.  Diabetic kidney evaluation complete with labs today along with HgbA1c.  Colonoscopy is up to date.

## 2024-01-16 NOTE — Patient Instructions (Addendum)
 Goal to exercise 150 minutes per week with at least 2 days of strength training once your shoulder is healed. Encouraged to park further when at the store, take stairs instead of elevators and to walk in place during commercials. Staying healthy and adopting a healthy lifestyle for your overall health is important. You should eat 7 or more servings of fruits and vegetables per day. You should drink plenty of water to keep yourself hydrated and your kidneys healthy. This includes about 65-80+ fluid ounces of water. Limit your intake of animal fats especially for elevated cholesterol. Avoid highly processed food and limit your salt intake if you have hypertension. Avoid foods high in saturated/Trans fats. Along with a healthy diet it is also very important to maintain time for yourself to maintain a healthy mental health with low stress levels. You should get atleast 150 min of moderate intensity exercise weekly for a healthy heart. Along with eating right and exercising, aim for at least 7-9 hours of sleep daily.  Eat more whole grains which includes barley, wheat berries, oats, brown rice and whole wheat pasta. Use healthy plant oils which include olive, soy, corn, sunflower and peanut. Limit your caffeine and sugary drinks. Limit your intake of fast foods. Limit milk and dairy products to one or two daily servings.    Health Maintenance  Topic Date Due   Eye exam for diabetics  10/30/2018   Mammogram  11/08/2022   Yearly kidney health urinalysis for diabetes  01/15/2024   COVID-19 Vaccine (4 - 2024-25 season) 02/01/2024*   Zoster (Shingles) Vaccine (1 of 2) 04/16/2024*   Pneumococcal Vaccination (1 of 2 - PCV) 01/15/2025*   DTaP/Tdap/Td vaccine (2 - Td or Tdap) 01/25/2024   Hemoglobin A1C  03/08/2024   Flu Shot  05/15/2024   Yearly kidney function blood test for diabetes  09/08/2024   Complete foot exam   09/08/2024   Pap with HPV screening  01/11/2027   Colon Cancer Screening  12/06/2031    Hepatitis C Screening  Completed   HIV Screening  Completed   HPV Vaccine  Aged Out  *Topic was postponed. The date shown is not the original due date.

## 2024-01-16 NOTE — Assessment & Plan Note (Signed)
 Will check vitamin D level and supplement as needed.    Also encouraged to spend 15 minutes in the sun daily.

## 2024-01-16 NOTE — Assessment & Plan Note (Addendum)
 She had to change to Ozempic due to insurance purposes - she had been on [place].  Farxiga added to decrease blood sugars, ozempic increased to 1mg discussed side effects and advised to hold medication if has vomiting/diarrhea or not feeling well.  Stable BP at top of borderline.  Patient instructed to monitor BP with adding Farxiga to medication regime for diabetes blood glucose control. EKG done NSR HR 80.

## 2024-01-16 NOTE — Assessment & Plan Note (Deleted)
 Stable.  Patient instructed to monitor BP with adding Farxiga to medication regime for diabetes blood glucose control.

## 2024-01-16 NOTE — Assessment & Plan Note (Deleted)
 She had to change to Ozempic due to insurance purposes - she had been on Swartz. Will increase Ozempic to 1 mg weekly and add Farxiga 10 mg discussed side effects and advised to hold medication if has vomiting/diarrhea or not feeling well.

## 2024-01-17 LAB — CBC WITH DIFFERENTIAL/PLATELET
Basophils Absolute: 0.1 10*3/uL (ref 0.0–0.2)
Basos: 1 %
EOS (ABSOLUTE): 0.1 10*3/uL (ref 0.0–0.4)
Eos: 1 %
Hematocrit: 40.3 % (ref 34.0–46.6)
Hemoglobin: 13.2 g/dL (ref 11.1–15.9)
Immature Grans (Abs): 0 10*3/uL (ref 0.0–0.1)
Immature Granulocytes: 0 %
Lymphocytes Absolute: 2.2 10*3/uL (ref 0.7–3.1)
Lymphs: 32 %
MCH: 28.3 pg (ref 26.6–33.0)
MCHC: 32.8 g/dL (ref 31.5–35.7)
MCV: 87 fL (ref 79–97)
Monocytes Absolute: 0.5 10*3/uL (ref 0.1–0.9)
Monocytes: 7 %
Neutrophils Absolute: 4.2 10*3/uL (ref 1.4–7.0)
Neutrophils: 59 %
Platelets: 421 10*3/uL (ref 150–450)
RBC: 4.66 x10E6/uL (ref 3.77–5.28)
RDW: 12.8 % (ref 11.7–15.4)
WBC: 6.9 10*3/uL (ref 3.4–10.8)

## 2024-01-17 LAB — LIPID PANEL
Chol/HDL Ratio: 3.6 ratio (ref 0.0–4.4)
Cholesterol, Total: 154 mg/dL (ref 100–199)
HDL: 43 mg/dL (ref >39)
LDL Chol Calc (NIH): 90 mg/dL (ref 0–99)
Triglycerides: 114 mg/dL (ref 0–149)
VLDL Cholesterol Cal: 21 mg/dL (ref 5–40)

## 2024-01-17 LAB — CMP14+EGFR
ALT: 28 IU/L (ref 0–32)
AST: 13 IU/L (ref 0–40)
Albumin: 4.3 g/dL (ref 3.8–4.9)
Alkaline Phosphatase: 117 IU/L (ref 44–121)
BUN/Creatinine Ratio: 13 (ref 9–23)
BUN: 13 mg/dL (ref 6–24)
Bilirubin Total: 0.5 mg/dL (ref 0.0–1.2)
CO2: 25 mmol/L (ref 20–29)
Calcium: 9.9 mg/dL (ref 8.7–10.2)
Chloride: 95 mmol/L — ABNORMAL LOW (ref 96–106)
Creatinine, Ser: 1.03 mg/dL — ABNORMAL HIGH (ref 0.57–1.00)
Globulin, Total: 2.9 g/dL (ref 1.5–4.5)
Glucose: 201 mg/dL — ABNORMAL HIGH (ref 70–99)
Potassium: 4.6 mmol/L (ref 3.5–5.2)
Sodium: 134 mmol/L (ref 134–144)
Total Protein: 7.2 g/dL (ref 6.0–8.5)
eGFR: 66 mL/min/{1.73_m2} (ref >59)

## 2024-01-17 LAB — VITAMIN D 25 HYDROXY (VIT D DEFICIENCY, FRACTURES): Vit D, 25-Hydroxy: 40.3 ng/mL (ref 30.0–100.0)

## 2024-01-17 LAB — HEMOGLOBIN A1C
Est. average glucose Bld gHb Est-mCnc: 252 mg/dL
Hgb A1c MFr Bld: 10.4 % — ABNORMAL HIGH (ref 4.8–5.6)

## 2024-01-17 LAB — MICROALBUMIN / CREATININE URINE RATIO
Creatinine, Urine: 182 mg/dL
Microalb/Creat Ratio: 5 mg/g{creat} (ref 0–29)
Microalbumin, Urine: 9.7 ug/mL

## 2024-01-20 ENCOUNTER — Encounter: Payer: Self-pay | Admitting: Nurse Practitioner

## 2024-01-20 ENCOUNTER — Other Ambulatory Visit: Payer: Self-pay

## 2024-01-20 ENCOUNTER — Other Ambulatory Visit (HOSPITAL_COMMUNITY): Payer: Self-pay

## 2024-01-20 DIAGNOSIS — E669 Obesity, unspecified: Secondary | ICD-10-CM

## 2024-01-20 MED ORDER — SEMAGLUTIDE (1 MG/DOSE) 4 MG/3ML ~~LOC~~ SOPN
1.0000 mg | PEN_INJECTOR | SUBCUTANEOUS | 1 refills | Status: DC
  Filled 2024-01-20 – 2024-01-31 (×3): qty 9, 84d supply, fill #0

## 2024-01-21 ENCOUNTER — Encounter (HOSPITAL_COMMUNITY): Payer: Self-pay

## 2024-01-21 ENCOUNTER — Other Ambulatory Visit (HOSPITAL_COMMUNITY): Payer: Self-pay

## 2024-01-21 DIAGNOSIS — M25512 Pain in left shoulder: Secondary | ICD-10-CM | POA: Diagnosis not present

## 2024-01-21 DIAGNOSIS — M25612 Stiffness of left shoulder, not elsewhere classified: Secondary | ICD-10-CM | POA: Diagnosis not present

## 2024-01-23 DIAGNOSIS — M25612 Stiffness of left shoulder, not elsewhere classified: Secondary | ICD-10-CM | POA: Diagnosis not present

## 2024-01-23 DIAGNOSIS — M25512 Pain in left shoulder: Secondary | ICD-10-CM | POA: Diagnosis not present

## 2024-01-28 DIAGNOSIS — M25512 Pain in left shoulder: Secondary | ICD-10-CM | POA: Diagnosis not present

## 2024-01-28 DIAGNOSIS — M25612 Stiffness of left shoulder, not elsewhere classified: Secondary | ICD-10-CM | POA: Diagnosis not present

## 2024-01-31 ENCOUNTER — Other Ambulatory Visit (HOSPITAL_COMMUNITY): Payer: Self-pay

## 2024-02-06 ENCOUNTER — Other Ambulatory Visit: Payer: Self-pay | Admitting: Nurse Practitioner

## 2024-02-06 DIAGNOSIS — I10 Essential (primary) hypertension: Secondary | ICD-10-CM

## 2024-02-17 ENCOUNTER — Encounter: Payer: Self-pay | Admitting: Nurse Practitioner

## 2024-02-17 ENCOUNTER — Ambulatory Visit: Payer: Self-pay | Admitting: Nurse Practitioner

## 2024-02-17 NOTE — Progress Notes (Deleted)
 Del Favia, CMA,acting as a Neurosurgeon for Kelsey Epley, FNP.,have documented all relevant documentation on the behalf of Kelsey Epley, FNP,as directed by  Kelsey Epley, FNP while in the presence of Kelsey Epley, FNP.  Subjective:  Patient ID: Kelsey Olson , female    DOB: December 06, 1970 , 53 y.o.   MRN: [id number]  No chief complaint on file.   HPI  HPI   Past Medical History:  Diagnosis Date   Chronic back pain    Diabetes mellitus 10/16/2003   Former smoker 02/12/2014   Hypertension    Obesity    Tubal pregnancy    Wears glasses      Family History  Problem Relation Age of Onset   Heart disease Mother        CHF   Diabetes Mother    Kidney disease Mother        dialysis   COPD Father    Hepatitis C Father    Cancer Father        colon   Diabetes Sister    Hypertension Sister    Cancer Maternal Uncle        prostate   Heart disease Maternal Grandmother    Hypertension Brother    Diabetes Brother    Hypertension Brother    Breast cancer Neg Hx      Current Outpatient Medications:    atorvastatin  (LIPITOR) 40 MG tablet, Take 1 tablet (40 mg total) by mouth at bedtime., Disp: 90 tablet, Rfl: 1   cholecalciferol (VITAMIN D3) 25 MCG (1000 UNIT) tablet, Take 1 tablet (1,000 Units total) by mouth daily., Disp: 30 tablet, Rfl: 2   dapagliflozin  propanediol (FARXIGA ) 10 MG TABS tablet, Take 1 tablet (10 mg total) by mouth daily before breakfast., Disp: , Rfl:    ferrous sulfate  325 (65 FE) MG EC tablet, Take 1 tablet by mouth twice daily, Disp: 60 tablet, Rfl: 0   fluticasone  (FLONASE ) 50 MCG/ACT nasal spray, Place 2 sprays into both nostrils daily., Disp: 16 g, Rfl: 2   ibuprofen  (ADVIL ) 800 MG tablet, Take 1 tablet (800 mg total) by mouth every 8 (eight) hours as needed., Disp: 30 tablet, Rfl: 2   lisinopril -hydrochlorothiazide  (ZESTORETIC ) 20-12.5 MG tablet, Take 1 tablet by mouth once daily, Disp: 90 tablet, Rfl: 0   meloxicam  (MOBIC ) 7.5 MG tablet, Take 1 tablet  (7.5 mg total) by mouth 2 (two) times daily., Disp: 60 tablet, Rfl: 0   meloxicam  (MOBIC ) 7.5 MG tablet, Take 1 tablet (7.5 mg total) by mouth 2 (two) times daily as needed for moderate pain., Disp: 60 tablet, Rfl: 0   methocarbamol  (ROBAXIN ) 500 MG tablet, Take 1 tablet (500 mg total) by mouth 3 (three) times daily for 14 days., Disp: 42 tablet, Rfl: 1   Semaglutide , 1 MG/DOSE, 4 MG/3ML SOPN, Inject 1 mg into the skin once a week., Disp: 9 mL, Rfl: 1   triamcinolone  ointment (KENALOG ) 0.5 %, APPLY  OINTMENT TOPICALLY TWICE DAILY, Disp: 30 g, Rfl: 0   Allergies  Allergen Reactions   Percocet [Oxycodone -Acetaminophen ] Itching and Other (See Comments)    Hallucinations     Review of Systems   There were no vitals filed for this visit. There is no height or weight on file to calculate BMI.  Wt Readings from Last 3 Encounters:  01/16/24 220 lb 9.6 oz (100.1 kg)  09/09/23 219 lb (99.3 kg)  01/15/23 217 lb 3.2 oz (98.5 kg)    The 10-year ASCVD risk score (Arnett  DK, et al., 2019) is: 9.9%   Values used to calculate the score:     Age: 88 years     Sex: Female     Is Non-Hispanic African American: Yes     Diabetic: Yes     Tobacco smoker: No     Systolic Blood Pressure: 130 mmHg     Is BP treated: Yes     HDL Cholesterol: 43 mg/dL     Total Cholesterol: 154 mg/dL  Objective:  Physical Exam      Assessment And Plan:  There are no diagnoses linked to this encounter.  No follow-ups on file.  Patient was given opportunity to ask questions. Patient verbalized understanding of the plan and was able to repeat key elements of the plan. All questions were answered to their satisfaction.    Inge Mangle, FNP, have reviewed all documentation for this visit. The documentation on 02/17/24 for the exam, diagnosis, procedures, and orders are all accurate and complete.   IF YOU HAVE BEEN REFERRED TO A SPECIALIST, IT MAY TAKE 1-2 WEEKS TO SCHEDULE/PROCESS THE REFERRAL. IF YOU HAVE NOT HEARD  FROM US /SPECIALIST IN TWO WEEKS, PLEASE GIVE US  A CALL AT [phone].

## 2024-02-18 ENCOUNTER — Other Ambulatory Visit: Payer: Self-pay

## 2024-02-18 ENCOUNTER — Inpatient Hospital Stay (HOSPITAL_COMMUNITY)
Admission: EM | Admit: 2024-02-18 | Discharge: 2024-02-22 | DRG: 871 | Disposition: A | Discharge status: Home / Self Care | Attending: Internal Medicine | Admitting: Internal Medicine

## 2024-02-18 DIAGNOSIS — E861 Hypovolemia: Secondary | ICD-10-CM | POA: Diagnosis present

## 2024-02-18 DIAGNOSIS — E1165 Type 2 diabetes mellitus with hyperglycemia: Secondary | ICD-10-CM | POA: Diagnosis present

## 2024-02-18 DIAGNOSIS — Z87891 Personal history of nicotine dependence: Secondary | ICD-10-CM

## 2024-02-18 DIAGNOSIS — E871 Hypo-osmolality and hyponatremia: Secondary | ICD-10-CM | POA: Diagnosis present

## 2024-02-18 DIAGNOSIS — I10 Essential (primary) hypertension: Secondary | ICD-10-CM | POA: Diagnosis present

## 2024-02-18 DIAGNOSIS — N83202 Unspecified ovarian cyst, left side: Secondary | ICD-10-CM | POA: Diagnosis present

## 2024-02-18 DIAGNOSIS — N170 Acute kidney failure with tubular necrosis: Secondary | ICD-10-CM | POA: Diagnosis present

## 2024-02-18 DIAGNOSIS — A419 Sepsis, unspecified organism: Secondary | ICD-10-CM | POA: Diagnosis not present

## 2024-02-18 DIAGNOSIS — E66812 Obesity, class 2: Secondary | ICD-10-CM | POA: Diagnosis present

## 2024-02-18 DIAGNOSIS — N3001 Acute cystitis with hematuria: Secondary | ICD-10-CM | POA: Diagnosis present

## 2024-02-18 DIAGNOSIS — N179 Acute kidney failure, unspecified: Secondary | ICD-10-CM | POA: Diagnosis present

## 2024-02-18 DIAGNOSIS — Z79899 Other long term (current) drug therapy: Secondary | ICD-10-CM

## 2024-02-18 DIAGNOSIS — I129 Hypertensive chronic kidney disease with stage 1 through stage 4 chronic kidney disease, or unspecified chronic kidney disease: Secondary | ICD-10-CM | POA: Diagnosis present

## 2024-02-18 DIAGNOSIS — E1122 Type 2 diabetes mellitus with diabetic chronic kidney disease: Secondary | ICD-10-CM | POA: Diagnosis present

## 2024-02-18 DIAGNOSIS — R000 Tachycardia, unspecified: Secondary | ICD-10-CM | POA: Diagnosis not present

## 2024-02-18 DIAGNOSIS — N182 Chronic kidney disease, stage 2 (mild): Secondary | ICD-10-CM | POA: Diagnosis present

## 2024-02-18 DIAGNOSIS — R6520 Severe sepsis without septic shock: Secondary | ICD-10-CM | POA: Diagnosis not present

## 2024-02-18 DIAGNOSIS — N3000 Acute cystitis without hematuria: Secondary | ICD-10-CM

## 2024-02-18 DIAGNOSIS — Z6834 Body mass index (BMI) 34.0-34.9, adult: Secondary | ICD-10-CM

## 2024-02-18 DIAGNOSIS — E785 Hyperlipidemia, unspecified: Secondary | ICD-10-CM | POA: Diagnosis present

## 2024-02-18 DIAGNOSIS — Z1152 Encounter for screening for COVID-19: Secondary | ICD-10-CM

## 2024-02-18 DIAGNOSIS — Z7985 Long-term (current) use of injectable non-insulin antidiabetic drugs: Secondary | ICD-10-CM

## 2024-02-18 DIAGNOSIS — E118 Type 2 diabetes mellitus with unspecified complications: Secondary | ICD-10-CM | POA: Diagnosis present

## 2024-02-18 DIAGNOSIS — E872 Acidosis, unspecified: Secondary | ICD-10-CM | POA: Diagnosis present

## 2024-02-18 LAB — URINALYSIS, W/ REFLEX TO CULTURE (INFECTION SUSPECTED)
Bilirubin Urine: NEGATIVE
Glucose, UA: >=500 mg/dL — AB
Ketones, ur: 15 mg/dL — AB
Nitrite: POSITIVE — AB
Protein, ur: >300 mg/dL — AB
Specific Gravity, Urine: 1.02 (ref 1.005–1.030)
WBC, UA: >50 WBC/hpf (ref 0–5)
pH: 6 (ref 5.0–8.0)

## 2024-02-18 LAB — COMPREHENSIVE METABOLIC PANEL WITH GFR
ALT: 17 U/L (ref 0–44)
AST: 14 U/L — ABNORMAL LOW (ref 15–41)
Albumin: 3.6 g/dL (ref 3.5–5.0)
Alkaline Phosphatase: 72 U/L (ref 38–126)
Anion gap: 11 (ref 5–15)
BUN: 15 mg/dL (ref 6–20)
CO2: 22 mmol/L (ref 22–32)
Calcium: 9.3 mg/dL (ref 8.9–10.3)
Chloride: 94 mmol/L — ABNORMAL LOW (ref 98–111)
Creatinine, Ser: 1.31 mg/dL — ABNORMAL HIGH (ref 0.44–1.00)
GFR, Estimated: 49 mL/min — ABNORMAL LOW (ref >60)
Glucose, Bld: 374 mg/dL — ABNORMAL HIGH (ref 70–99)
Potassium: 4 mmol/L (ref 3.5–5.1)
Sodium: 127 mmol/L — ABNORMAL LOW (ref 135–145)
Total Bilirubin: 0.6 mg/dL (ref 0.0–1.2)
Total Protein: 7.4 g/dL (ref 6.5–8.1)

## 2024-02-18 LAB — CBC
HCT: 38.8 % (ref 36.0–46.0)
Hemoglobin: 12.6 g/dL (ref 12.0–15.0)
MCH: 28.2 pg (ref 26.0–34.0)
MCHC: 32.5 g/dL (ref 30.0–36.0)
MCV: 86.8 fL (ref 80.0–100.0)
Platelets: 404 10*3/uL — ABNORMAL HIGH (ref 150–400)
RBC: 4.47 MIL/uL (ref 3.87–5.11)
RDW: 13 % (ref 11.5–15.5)
WBC: 16.9 10*3/uL — ABNORMAL HIGH (ref 4.0–10.5)
nRBC: 0 % (ref 0.0–0.2)

## 2024-02-18 LAB — LIPASE, BLOOD: Lipase: 35 U/L (ref 11–51)

## 2024-02-18 LAB — HCG, SERUM, QUALITATIVE: Preg, Serum: NEGATIVE

## 2024-02-18 NOTE — ED Triage Notes (Signed)
 Pt arrives wheelchair via POV - abd pain. Pt used teledoc this morning for UTI. Pt advised to come to ER. Pt has bilateral flank pain. Pt had one episode of emesis in the lobby. Pt has body aches, chills, fever. Pt has dysuria and hematuria. Symptoms started Sunday.

## 2024-02-19 ENCOUNTER — Other Ambulatory Visit (HOSPITAL_COMMUNITY): Payer: Self-pay

## 2024-02-19 ENCOUNTER — Inpatient Hospital Stay (HOSPITAL_COMMUNITY)

## 2024-02-19 ENCOUNTER — Telehealth (HOSPITAL_COMMUNITY): Payer: Self-pay | Admitting: Pharmacy Technician

## 2024-02-19 ENCOUNTER — Emergency Department (HOSPITAL_COMMUNITY)

## 2024-02-19 DIAGNOSIS — Z7985 Long-term (current) use of injectable non-insulin antidiabetic drugs: Secondary | ICD-10-CM | POA: Diagnosis not present

## 2024-02-19 DIAGNOSIS — E872 Acidosis, unspecified: Secondary | ICD-10-CM | POA: Diagnosis not present

## 2024-02-19 DIAGNOSIS — R6520 Severe sepsis without septic shock: Secondary | ICD-10-CM | POA: Diagnosis not present

## 2024-02-19 DIAGNOSIS — I10 Essential (primary) hypertension: Secondary | ICD-10-CM

## 2024-02-19 DIAGNOSIS — E118 Type 2 diabetes mellitus with unspecified complications: Secondary | ICD-10-CM

## 2024-02-19 DIAGNOSIS — E1122 Type 2 diabetes mellitus with diabetic chronic kidney disease: Secondary | ICD-10-CM | POA: Diagnosis not present

## 2024-02-19 DIAGNOSIS — E785 Hyperlipidemia, unspecified: Secondary | ICD-10-CM | POA: Diagnosis not present

## 2024-02-19 DIAGNOSIS — I129 Hypertensive chronic kidney disease with stage 1 through stage 4 chronic kidney disease, or unspecified chronic kidney disease: Secondary | ICD-10-CM | POA: Diagnosis not present

## 2024-02-19 DIAGNOSIS — Z79899 Other long term (current) drug therapy: Secondary | ICD-10-CM | POA: Diagnosis not present

## 2024-02-19 DIAGNOSIS — N1330 Unspecified hydronephrosis: Secondary | ICD-10-CM | POA: Diagnosis not present

## 2024-02-19 DIAGNOSIS — N182 Chronic kidney disease, stage 2 (mild): Secondary | ICD-10-CM | POA: Diagnosis not present

## 2024-02-19 DIAGNOSIS — R000 Tachycardia, unspecified: Secondary | ICD-10-CM | POA: Diagnosis not present

## 2024-02-19 DIAGNOSIS — E1165 Type 2 diabetes mellitus with hyperglycemia: Secondary | ICD-10-CM | POA: Diagnosis not present

## 2024-02-19 DIAGNOSIS — Z0389 Encounter for observation for other suspected diseases and conditions ruled out: Secondary | ICD-10-CM | POA: Diagnosis not present

## 2024-02-19 DIAGNOSIS — E66812 Obesity, class 2: Secondary | ICD-10-CM | POA: Diagnosis not present

## 2024-02-19 DIAGNOSIS — N83202 Unspecified ovarian cyst, left side: Secondary | ICD-10-CM | POA: Diagnosis not present

## 2024-02-19 DIAGNOSIS — E871 Hypo-osmolality and hyponatremia: Secondary | ICD-10-CM | POA: Diagnosis not present

## 2024-02-19 DIAGNOSIS — N3001 Acute cystitis with hematuria: Secondary | ICD-10-CM | POA: Diagnosis not present

## 2024-02-19 DIAGNOSIS — Z87891 Personal history of nicotine dependence: Secondary | ICD-10-CM | POA: Diagnosis not present

## 2024-02-19 DIAGNOSIS — N179 Acute kidney failure, unspecified: Secondary | ICD-10-CM | POA: Diagnosis present

## 2024-02-19 DIAGNOSIS — N390 Urinary tract infection, site not specified: Secondary | ICD-10-CM

## 2024-02-19 DIAGNOSIS — E861 Hypovolemia: Secondary | ICD-10-CM | POA: Diagnosis not present

## 2024-02-19 DIAGNOSIS — N170 Acute kidney failure with tubular necrosis: Secondary | ICD-10-CM | POA: Diagnosis not present

## 2024-02-19 DIAGNOSIS — A419 Sepsis, unspecified organism: Secondary | ICD-10-CM | POA: Diagnosis present

## 2024-02-19 DIAGNOSIS — Z1152 Encounter for screening for COVID-19: Secondary | ICD-10-CM | POA: Diagnosis not present

## 2024-02-19 DIAGNOSIS — Z6834 Body mass index (BMI) 34.0-34.9, adult: Secondary | ICD-10-CM | POA: Diagnosis not present

## 2024-02-19 LAB — HIV ANTIBODY (ROUTINE TESTING W REFLEX): HIV Screen 4th Generation wRfx: NONREACTIVE

## 2024-02-19 LAB — CBC
HCT: 33.7 % — ABNORMAL LOW (ref 36.0–46.0)
Hemoglobin: 11.2 g/dL — ABNORMAL LOW (ref 12.0–15.0)
MCH: 28.6 pg (ref 26.0–34.0)
MCHC: 33.2 g/dL (ref 30.0–36.0)
MCV: 86 fL (ref 80.0–100.0)
Platelets: 338 10*3/uL (ref 150–400)
RBC: 3.92 MIL/uL (ref 3.87–5.11)
RDW: 13.1 % (ref 11.5–15.5)
WBC: 14.5 10*3/uL — ABNORMAL HIGH (ref 4.0–10.5)
nRBC: 0 % (ref 0.0–0.2)

## 2024-02-19 LAB — COMPREHENSIVE METABOLIC PANEL WITH GFR
ALT: 18 U/L (ref 0–44)
AST: 15 U/L (ref 15–41)
Albumin: 4 g/dL (ref 3.5–5.0)
Alkaline Phosphatase: 76 U/L (ref 38–126)
Anion gap: 15 (ref 5–15)
BUN: 19 mg/dL (ref 6–20)
CO2: 21 mmol/L — ABNORMAL LOW (ref 22–32)
Calcium: 9.7 mg/dL (ref 8.9–10.3)
Chloride: 92 mmol/L — ABNORMAL LOW (ref 98–111)
Creatinine, Ser: 1.76 mg/dL — ABNORMAL HIGH (ref 0.44–1.00)
GFR, Estimated: 35 mL/min — ABNORMAL LOW (ref >60)
Glucose, Bld: 366 mg/dL — ABNORMAL HIGH (ref 70–99)
Potassium: 3.8 mmol/L (ref 3.5–5.1)
Sodium: 128 mmol/L — ABNORMAL LOW (ref 135–145)
Total Bilirubin: 0.9 mg/dL (ref 0.0–1.2)
Total Protein: 8.1 g/dL (ref 6.5–8.1)

## 2024-02-19 LAB — CBC WITH DIFFERENTIAL/PLATELET
Abs Immature Granulocytes: 0.15 10*3/uL — ABNORMAL HIGH (ref 0.00–0.07)
Basophils Absolute: 0.1 10*3/uL (ref 0.0–0.1)
Basophils Relative: 0 %
Eosinophils Absolute: 0 10*3/uL (ref 0.0–0.5)
Eosinophils Relative: 0 %
HCT: 32 % — ABNORMAL LOW (ref 36.0–46.0)
Hemoglobin: 10.6 g/dL — ABNORMAL LOW (ref 12.0–15.0)
Immature Granulocytes: 1 %
Lymphocytes Relative: 5 %
Lymphs Abs: 1.2 10*3/uL (ref 0.7–4.0)
MCH: 28.8 pg (ref 26.0–34.0)
MCHC: 33.1 g/dL (ref 30.0–36.0)
MCV: 87 fL (ref 80.0–100.0)
Monocytes Absolute: 1.4 10*3/uL — ABNORMAL HIGH (ref 0.1–1.0)
Monocytes Relative: 6 %
Neutro Abs: 23.1 10*3/uL — ABNORMAL HIGH (ref 1.7–7.7)
Neutrophils Relative %: 88 %
Platelets: 380 10*3/uL (ref 150–400)
RBC: 3.68 MIL/uL — ABNORMAL LOW (ref 3.87–5.11)
RDW: 13.2 % (ref 11.5–15.5)
Smear Review: NORMAL
WBC: 26 10*3/uL — ABNORMAL HIGH (ref 4.0–10.5)
nRBC: 0 % (ref 0.0–0.2)

## 2024-02-19 LAB — BASIC METABOLIC PANEL WITH GFR
Anion gap: 10 (ref 5–15)
BUN: 17 mg/dL (ref 6–20)
CO2: 23 mmol/L (ref 22–32)
Calcium: 8.7 mg/dL — ABNORMAL LOW (ref 8.9–10.3)
Chloride: 101 mmol/L (ref 98–111)
Creatinine, Ser: 1.55 mg/dL — ABNORMAL HIGH (ref 0.44–1.00)
GFR, Estimated: 40 mL/min — ABNORMAL LOW (ref >60)
Glucose, Bld: 158 mg/dL — ABNORMAL HIGH (ref 70–99)
Potassium: 3.6 mmol/L (ref 3.5–5.1)
Sodium: 134 mmol/L — ABNORMAL LOW (ref 135–145)

## 2024-02-19 LAB — CBG MONITORING, ED
Glucose-Capillary: 254 mg/dL — ABNORMAL HIGH (ref 70–99)
Glucose-Capillary: 217 mg/dL — ABNORMAL HIGH (ref 70–99)
Glucose-Capillary: 208 mg/dL — ABNORMAL HIGH (ref 70–99)
Glucose-Capillary: 207 mg/dL — ABNORMAL HIGH (ref 70–99)

## 2024-02-19 LAB — CREATININE, SERUM
Creatinine, Ser: 1.87 mg/dL — ABNORMAL HIGH (ref 0.44–1.00)
GFR, Estimated: 32 mL/min — ABNORMAL LOW (ref >60)

## 2024-02-19 LAB — PROTIME-INR
INR: 1 (ref 0.8–1.2)
Prothrombin Time: 13.8 s (ref 11.4–15.2)

## 2024-02-19 LAB — RESP PANEL BY RT-PCR (RSV, FLU A&B, COVID)  RVPGX2
Influenza A by PCR: NEGATIVE
Influenza B by PCR: NEGATIVE
Resp Syncytial Virus by PCR: NEGATIVE
SARS Coronavirus 2 by RT PCR: NEGATIVE

## 2024-02-19 LAB — GLUCOSE, CAPILLARY
Glucose-Capillary: 154 mg/dL — ABNORMAL HIGH (ref 70–99)
Glucose-Capillary: 179 mg/dL — ABNORMAL HIGH (ref 70–99)

## 2024-02-19 LAB — HEMOGLOBIN A1C
Hgb A1c MFr Bld: 9 % — ABNORMAL HIGH (ref 4.8–5.6)
Mean Plasma Glucose: 211.6 mg/dL

## 2024-02-19 LAB — LACTIC ACID, PLASMA: Lactic Acid, Venous: 2.8 mmol/L (ref 0.5–1.9)

## 2024-02-19 LAB — I-STAT CG4 LACTIC ACID, ED
Lactic Acid, Venous: 2.2 mmol/L (ref 0.5–1.9)
Lactic Acid, Venous: 2.4 mmol/L (ref 0.5–1.9)

## 2024-02-19 LAB — BETA-HYDROXYBUTYRIC ACID: Beta-Hydroxybutyric Acid: 0.13 mmol/L (ref 0.05–0.27)

## 2024-02-19 MED ORDER — LACTATED RINGERS IV BOLUS (SEPSIS): 1000.0000 mL | Freq: Once | INTRAVENOUS | Status: DC

## 2024-02-19 MED ORDER — INSULIN ASPART 100 UNIT/ML IJ SOLN: 0.0000 [IU] | Freq: Every day | INTRAMUSCULAR | Status: DC

## 2024-02-19 MED ORDER — ACETAMINOPHEN 325 MG PO TABS
650.0000 mg | ORAL_TABLET | Freq: Four times a day (QID) | ORAL | Status: DC | PRN
  Filled 2024-02-19 (×6): qty 2

## 2024-02-19 MED ORDER — SODIUM CHLORIDE 0.9 % IV SOLN
2.0000 g | INTRAVENOUS | Status: DC
  Administered 2024-02-19 – 2024-02-21 (×3): 2 g via INTRAVENOUS
  Filled 2024-02-19 (×3): qty 20

## 2024-02-19 MED ORDER — INSULIN GLARGINE-YFGN 100 UNIT/ML ~~LOC~~ SOLN
10.0000 [IU] | Freq: Every day | SUBCUTANEOUS | Status: DC
  Administered 2024-02-19 – 2024-02-21 (×3): 10 [IU] via SUBCUTANEOUS
  Filled 2024-02-19 (×4): qty 0.1

## 2024-02-19 MED ORDER — LACTATED RINGERS IV BOLUS (SEPSIS)
1000.0000 mL | Freq: Once | INTRAVENOUS | Status: AC
Start: 2024-02-19 — End: 2024-02-19
  Administered 2024-02-19: 1000 mL via INTRAVENOUS

## 2024-02-19 MED ORDER — LACTATED RINGERS IV BOLUS (SEPSIS)
1000.0000 mL | Freq: Once | INTRAVENOUS | Status: AC
  Administered 2024-02-19: 1000 mL via INTRAVENOUS

## 2024-02-19 MED ORDER — VITAMIN D 25 MCG (1000 UNIT) PO TABS
1000.0000 [IU] | ORAL_TABLET | Freq: Every day | ORAL | Status: DC
Start: 2024-02-19 — End: 2024-02-22
  Administered 2024-02-19 – 2024-02-22 (×4): 1000 [IU] via ORAL
  Filled 2024-02-19 (×4): qty 1

## 2024-02-19 MED ORDER — SODIUM CHLORIDE 0.9 % IV SOLN
2.0000 g | Freq: Once | INTRAVENOUS | Status: AC
  Administered 2024-02-19: 2 g via INTRAVENOUS
  Filled 2024-02-19: qty 20

## 2024-02-19 MED ORDER — ATORVASTATIN CALCIUM 40 MG PO TABS
40.0000 mg | ORAL_TABLET | Freq: Every day | ORAL | Status: DC
  Administered 2024-02-19 – 2024-02-21 (×3): 40 mg via ORAL
  Filled 2024-02-19 (×3): qty 1

## 2024-02-19 MED ORDER — INSULIN ASPART 100 UNIT/ML IJ SOLN
0.0000 [IU] | Freq: Three times a day (TID) | INTRAMUSCULAR | Status: DC
  Administered 2024-02-19: 3 [IU] via SUBCUTANEOUS

## 2024-02-19 MED ORDER — ENOXAPARIN SODIUM 40 MG/0.4ML IJ SOSY
40.0000 mg | PREFILLED_SYRINGE | INTRAMUSCULAR | Status: DC
  Administered 2024-02-19 – 2024-02-21 (×3): 40 mg via SUBCUTANEOUS
  Filled 2024-02-19 (×3): qty 0.4

## 2024-02-19 MED ORDER — ACETAMINOPHEN 325 MG PO TABS
650.0000 mg | ORAL_TABLET | Freq: Four times a day (QID) | ORAL | Status: DC | PRN
  Administered 2024-02-19 – 2024-02-21 (×7): 650 mg via ORAL
  Filled 2024-02-19: qty 2

## 2024-02-19 MED ORDER — KETOROLAC TROMETHAMINE 30 MG/ML IJ SOLN
30.0000 mg | Freq: Once | INTRAMUSCULAR | Status: AC
  Administered 2024-02-19: 30 mg via INTRAVENOUS
  Filled 2024-02-19: qty 1

## 2024-02-19 MED ORDER — OXYCODONE HCL 5 MG PO TABS
5.0000 mg | ORAL_TABLET | ORAL | Status: DC | PRN
  Administered 2024-02-19 – 2024-02-20 (×4): 5 mg via ORAL
  Filled 2024-02-19 (×4): qty 1

## 2024-02-19 MED ORDER — FLUTICASONE PROPIONATE 50 MCG/ACT NA SUSP
2.0000 | Freq: Every day | NASAL | Status: DC
  Administered 2024-02-20 – 2024-02-21 (×2): 2 via NASAL
  Filled 2024-02-19: qty 16

## 2024-02-19 MED ORDER — VITAMIN B-12 100 MCG PO TABS
100.0000 ug | ORAL_TABLET | Freq: Every day | ORAL | Status: DC
  Administered 2024-02-19 – 2024-02-22 (×4): 100 ug via ORAL
  Filled 2024-02-19 (×4): qty 1

## 2024-02-19 MED ORDER — FENTANYL CITRATE PF 50 MCG/ML IJ SOSY
25.0000 ug | PREFILLED_SYRINGE | INTRAMUSCULAR | Status: DC | PRN
Start: 2024-02-19 — End: 2024-02-19
  Administered 2024-02-19: 50 ug via INTRAVENOUS
  Filled 2024-02-19: qty 1

## 2024-02-19 MED ORDER — ONDANSETRON HCL 4 MG/2ML IJ SOLN: 4.0000 mg | Freq: Four times a day (QID) | INTRAMUSCULAR | Status: DC | PRN

## 2024-02-19 MED ORDER — INSULIN ASPART 100 UNIT/ML IJ SOLN
0.0000 [IU] | Freq: Three times a day (TID) | INTRAMUSCULAR | Status: DC
  Administered 2024-02-19: 5 [IU] via SUBCUTANEOUS
  Administered 2024-02-19 – 2024-02-20 (×2): 3 [IU] via SUBCUTANEOUS
  Administered 2024-02-20: 2 [IU] via SUBCUTANEOUS
  Administered 2024-02-20: 3 [IU] via SUBCUTANEOUS
  Administered 2024-02-21 (×2): 2 [IU] via SUBCUTANEOUS
  Administered 2024-02-21: 3 [IU] via SUBCUTANEOUS
  Administered 2024-02-22: 2 [IU] via SUBCUTANEOUS

## 2024-02-19 MED ORDER — LACTATED RINGERS IV SOLN
INTRAVENOUS | Status: DC
  Administered 2024-02-19: 150 mL via INTRAVENOUS

## 2024-02-19 NOTE — H&P (Addendum)
 History and Physical    Patient: Kelsey Olson:[id number] DOB: 06/14/1971 DOA: 02/18/2024 DOS: the patient was seen and examined on 02/19/2024 PCP: Susanna Epley, FNP  Patient coming from: Home  Chief Complaint:  Chief Complaint  Patient presents with   Dysuria   HPI: Kelsey Olson is a 53 y.o. female with medical history significant of poorly controlled T2DM and HTN p/w dysuria, poluria and vomiting c/f sepsis of urinary origin.  Pt states that she was in her USOH until Sunday afternoon, when she experienced chills. Her chills were followed by dysuria and polyuria on Monday and for this reason she contacted her provider via telemedicine and was prescribed nitrofurantoin which she took a few doses; however, on Tuesday her sx progressed to extreme discomfort including dysuria with each urination and urinary dribbling with standing, as well as lower back pain; as such, pt presented to the ED for evaluation. Of note, pt denies an fevers or lethargy, as well as recent sick contacts. Pt states that she has been on Ozempic  (currently on 1 mg/week dose) for a month and her blood sugars have improved from 200s to 150s at home (NOTE: Pt has not checked her blood sugar in a week).   In the ED, pt was noted to have abnl UA (pos LE/nitrite/bacteria), elevated lactic acid (2.2-->2.8) and elevated glucose (300s, but no anion gap), was started on IV CTX and s/p 3L NS in ED and admitted to medicine for ongoing care.  Review of Systems: As mentioned in the history of present illness. All other systems reviewed and are negative. Past Medical History:  Diagnosis Date   Chronic back pain    Diabetes mellitus 10/16/2003   Former smoker 02/12/2014   Hypertension    Obesity    Tubal pregnancy    Wears glasses    Past Surgical History:  Procedure Laterality Date   ECTOPIC PREGNANCY SURGERY  1999   right   Social History:  reports that she quit smoking about 10 years ago. Her smoking use included  cigarettes. She started smoking about 34 years ago. She has a 12 pack-year smoking history. She has never used smokeless tobacco. She reports that she does not drink alcohol and does not use drugs.  Allergies  Allergen Reactions   Percocet [Oxycodone -Acetaminophen ] Itching and Other (See Comments)    Hallucinations    Family History  Problem Relation Age of Onset   Heart disease Mother        CHF   Diabetes Mother    Kidney disease Mother        dialysis   COPD Father    Hepatitis C Father    Cancer Father        colon   Diabetes Sister    Hypertension Sister    Cancer Maternal Uncle        prostate   Heart disease Maternal Grandmother    Hypertension Brother    Diabetes Brother    Hypertension Brother    Breast cancer Neg Hx     Prior to Admission medications   Medication Sig Start Date End Date Taking? Authorizing Provider  acetaminophen  (TYLENOL ) 500 MG tablet Take 1,000 mg by mouth every 6 (six) hours as needed for fever or mild pain (pain score 1-3).   Yes [provider]  atorvastatin  (LIPITOR) 40 MG tablet Take 1 tablet (40 mg total) by mouth at bedtime. 09/09/23  Yes Susanna Epley, FNP  cholecalciferol (VITAMIN D3) 25 MCG (1000 UNIT) tablet  Take 1 tablet (1,000 Units total) by mouth daily. 09/11/21  Yes Jawanda Passey, Janece, FNP  cyanocobalamin 100 MCG tablet Take 100 mcg by mouth daily.   Yes [provider]  ferrous sulfate  325 (65 FE) MG EC tablet Take 1 tablet by mouth twice daily Patient taking differently: Take 1 tablet by mouth daily with breakfast. 11/11/23  Yes Morris Markham, Janece, FNP  fluticasone  (FLONASE ) 50 MCG/ACT nasal spray Place 2 sprays into both nostrils daily. 05/21/23  Yes Susanna Epley, FNP  ibuprofen  (ADVIL ) 800 MG tablet Take 1 tablet (800 mg total) by mouth every 8 (eight) hours as needed. 12/27/22  Yes Susanna Epley, FNP  lisinopril -hydrochlorothiazide  (ZESTORETIC ) 20-12.5 MG tablet Take 1 tablet by mouth once daily 02/06/24  Yes Magic Mohler,  Janece, FNP  Semaglutide , 1 MG/DOSE, 4 MG/3ML SOPN Inject 1 mg into the skin once a week. 01/20/24  Yes Susanna Epley, FNP  triamcinolone  ointment (KENALOG ) 0.5 % APPLY  OINTMENT TOPICALLY TWICE DAILY Patient taking differently: Apply 1 Application topically 2 (two) times daily as needed (psoriasis). 09/20/23  Yes Susanna Epley, FNP    Physical Exam: Vitals:   02/19/24 0800 02/19/24 0815 02/19/24 0830 02/19/24 0845  BP: (!) 126/59 115/75 119/85 (!) 123/98  Pulse: (!) 106 (!) 102 (!) 111 (!) 122  Resp: 17 16 18  (!) 22  Temp:      TempSrc:      SpO2: 100% 100% 100% 100%  Weight:      Height:       General: Alert, oriented x3, resting comfortably in no acute distress Respiratory: Lungs clear to auscultation bilaterally with normal respiratory effort; no w/r/r Cardiovascular: Regular rate and rhythm w/o m/r/g Abdomen: Soft, nontender, nondistended. Positive bowel sounds MSK: No CVA tenderness  Data Reviewed:  Lab Results  Component Value Date   WBC 14.5 (H) 02/19/2024   HGB 11.2 (L) 02/19/2024   HCT 33.7 (L) 02/19/2024   MCV 86.0 02/19/2024   PLT 338 02/19/2024   Lab Results  Component Value Date   GLUCOSE 366 (H) 02/19/2024   CALCIUM  9.7 02/19/2024   NA 128 (L) 02/19/2024   K 3.8 02/19/2024   CO2 21 (L) 02/19/2024   CL 92 (L) 02/19/2024   BUN 19 02/19/2024   CREATININE 1.87 (H) 02/19/2024   Lab Results  Component Value Date   ALT 18 02/19/2024   AST 15 02/19/2024   ALKPHOS 76 02/19/2024   BILITOT 0.9 02/19/2024   Lab Results  Component Value Date   INR 1.0 02/19/2024   INR 1.0 12/17/2018    Radiology: CT RENAL STONE STUDY Result Date: 02/19/2024 CLINICAL DATA:  Abdominal/flank pain with stone suspected. UTI with sepsis. EXAM: CT ABDOMEN AND PELVIS WITHOUT CONTRAST TECHNIQUE: Multidetector CT imaging of the abdomen and pelvis was performed following the standard protocol without IV contrast. RADIATION DOSE REDUCTION: This exam was performed according to the  departmental dose-optimization program which includes automated exposure control, adjustment of the mA and/or kV according to patient size and/or use of iterative reconstruction technique. COMPARISON:  04/09/2013 abdominal CT FINDINGS: Lower chest:  No contributory findings. Hepatobiliary: No focal liver abnormality.No evidence of biliary obstruction or stone. Pancreas: Unremarkable. Spleen: Unremarkable. Adrenals/Urinary Tract: Negative adrenals. Left hydroureteronephrosis to the level of the urinary bladder. No calcified stone. Extensive right renal cortical scarring with lobulation and thinning. Mild right hydroureteronephrosis. Collapsed urinary bladder with extensive perivesicular fat stranding, there is history of UTI. Stomach/Bowel:  No obstruction. No appendicitis. Vascular/Lymphatic: No acute vascular abnormality. No mass or adenopathy.  Reproductive:5.1 cm cystic density at the left adnexa/ovary Other: No ascites or pneumoperitoneum. Musculoskeletal: No acute abnormalities. Lumbar spine degeneration, especially L4-5 facet osteoarthritis. IMPRESSION: Mild left more than right hydroureteronephrosis to the level of the collapsed urinary bladder which shows prominent cystitis findings. No calculus. Extensive right renal cortical scarring. Cystic density at the left ovary measuring up to 5.1 cm, recommend pelvic ultrasound in 6-12 weeks in this perimenopausal age patient. Electronically Signed   By: Ronnette Coke M.D.   On: 02/19/2024 05:22   DG Chest Port 1 View Result Date: 02/19/2024 CLINICAL DATA:  Possible sepsis EXAM: PORTABLE CHEST 1 VIEW COMPARISON:  12/27/2020 FINDINGS: The heart size and mediastinal contours are within normal limits. Both lungs are clear. The visualized skeletal structures are unremarkable. IMPRESSION: No active disease. Electronically Signed   By: Violeta Grey M.D.   On: 02/19/2024 03:48    Assessment and Plan: 29F h/o poorly controlled T2DM (A1c 10.4 on 01/16/24) and HTN p/w  dysuria, poluria and vomiting c/f sepsis of urinary origin.  #Sepsis 2/2 urinary origin iso poorly controlled T2DM Pt with polyuria and polydipsia likely 2/2 poorly controlled diabetes leading to UTI with elevated lactic acid, but stable vitals for now; no prior urine culture data in Epic -Continue IV CTX 2g daily for now; no need to broaden abx for now; if pt with unstable BP wil consider broadening to ESBL coverage -Continue MIVF: NS at 150cc/h -F/u urine and blood cultures and titrate abx accordingly; will need TTE if blood cultures positive  #AKI Presumably 2/2 infection vs hypovolemia 2/2 diuresis from hyperglycemia -MIVF per above  #T2DM A1c 104. In 01/2024 -Semglee 10U daily (pta lantus 20U daily) -SSI TID AC - BMP q4h to monitor for DKA; will consider Endotool -NPO for now  #H/o HTN -HOLD pta ACEi iso AKI above; resume prior to d/c as able   Advance Care Planning:   Code Status: Full Code   Consults: N/A  Family Communication: N/A  Severity of Illness: The appropriate patient status for this patient is INPATIENT. Inpatient status is judged to be reasonable and necessary in order to provide the required intensity of service to ensure the patient's safety. The patient's presenting symptoms, physical exam findings, and initial radiographic and laboratory data in the context of their chronic comorbidities is felt to place them at high risk for further clinical deterioration. Furthermore, it is not anticipated that the patient will be medically stable for discharge from the hospital within 2 midnights of admission.   * I certify that at the point of admission it is my clinical judgment that the patient will require inpatient hospital care spanning beyond 2 midnights from the point of admission due to high intensity of service, high risk for further deterioration and high frequency of surveillance required.*   ------- I spent 75 minutes reviewing previous labs/notes, obtaining  separate history at the bedside, counseling/discussing the treatment plan outlined above, ordering medications/tests, and performing clinical documentation.  Author: Arne Langdon, MD 02/19/2024 10:08 AM  For on call review [email or web address].

## 2024-02-19 NOTE — ED Notes (Signed)
 Patient brief changed, peri care performed, patient states no other needs at this time.

## 2024-02-19 NOTE — Sepsis Progress Note (Signed)
 Elink monitoring for the code sepsis protocol.

## 2024-02-19 NOTE — ED Provider Notes (Addendum)
 [place] [facility]MENT AT [facility] Provider Note   CSN: [id number] Arrival date & time: 02/18/24  1834     History  Chief Complaint  Patient presents with   Dysuria    Kelsey Olson is a 53 y.o. female.  The history is provided by the patient.  Dysuria Pain quality:  Burning Pain severity:  Moderate Onset quality:  Gradual Duration:  2 days Timing:  Constant Progression:  Unchanged Chronicity:  Recurrent Relieved by:  Nothing Worsened by:  Nothing Ineffective treatments:  None tried Urinary symptoms: foul-smelling urine and frequent urination   Associated symptoms: abdominal pain, nausea and vomiting   Risk factors: no hx of pyelonephritis, no single kidney and no urinary catheter   Patient with DM with chills and low back pain and urinary symptoms.      Past Medical History:  Diagnosis Date   Chronic back pain    Diabetes mellitus 10/16/2003   Former smoker 02/12/2014   Hypertension    Obesity    Tubal pregnancy    Wears glasses      Home Medications Prior to Admission medications   Medication Sig Start Date End Date Taking? Authorizing Provider  atorvastatin  (LIPITOR) 40 MG tablet Take 1 tablet (40 mg total) by mouth at bedtime. 09/09/23   Susanna Epley, FNP  cholecalciferol (VITAMIN D3) 25 MCG (1000 UNIT) tablet Take 1 tablet (1,000 Units total) by mouth daily. 09/11/21   Susanna Epley, FNP  dapagliflozin  propanediol (FARXIGA ) 10 MG TABS tablet Take 1 tablet (10 mg total) by mouth daily before breakfast. 01/16/24   Susanna Epley, FNP  ferrous sulfate  325 (65 FE) MG EC tablet Take 1 tablet by mouth twice daily 11/11/23   Moore, Janece, FNP  fluticasone  (FLONASE ) 50 MCG/ACT nasal spray Place 2 sprays into both nostrils daily. 05/21/23   Susanna Epley, FNP  ibuprofen  (ADVIL ) 800 MG tablet Take 1 tablet (800 mg total) by mouth every 8 (eight) hours as needed. 12/27/22   Moore, Janece, FNP  lisinopril -hydrochlorothiazide  (ZESTORETIC ) 20-12.5 MG  tablet Take 1 tablet by mouth once daily 02/06/24   Moore, Janece, FNP  meloxicam  (MOBIC ) 7.5 MG tablet Take 1 tablet (7.5 mg total) by mouth 2 (two) times daily. 12/13/23     meloxicam  (MOBIC ) 7.5 MG tablet Take 1 tablet (7.5 mg total) by mouth 2 (two) times daily as needed for moderate pain. 01/09/24     methocarbamol  (ROBAXIN ) 500 MG tablet Take 1 tablet (500 mg total) by mouth 3 (three) times daily for 14 days. 12/13/23     Semaglutide , 1 MG/DOSE, 4 MG/3ML SOPN Inject 1 mg into the skin once a week. 01/20/24   Susanna Epley, FNP  triamcinolone  ointment (KENALOG ) 0.5 % APPLY  OINTMENT TOPICALLY TWICE DAILY 09/20/23   Moore, Janece, FNP      Allergies    Percocet [oxycodone -acetaminophen ]    Review of Systems   Review of Systems  Constitutional:  Positive for chills.  Gastrointestinal:  Positive for abdominal pain, nausea and vomiting.  Genitourinary:  Positive for dysuria.  All other systems reviewed and are negative.   Physical Exam Updated Vital Signs BP 124/74   Pulse (!) 103   Temp 99 F (37.2 C) (Oral)   Resp 16   Ht 5\' 5"  (1.651 m)   Wt 95.3 kg   SpO2 100%   BMI 34.95 kg/m  Physical Exam Vitals and nursing note reviewed.  Constitutional:      General: She is not in acute distress.  Appearance: Normal appearance. She is well-developed.  HENT:     Head: Normocephalic and atraumatic.     Nose: Nose normal.  Eyes:     Pupils: Pupils are equal, round, and reactive to light.  Cardiovascular:     Rate and Rhythm: Regular rhythm. Tachycardia present.     Pulses: Normal pulses.     Heart sounds: Normal heart sounds.  Pulmonary:     Effort: Pulmonary effort is normal. No respiratory distress.     Breath sounds: Normal breath sounds.  Abdominal:     General: Bowel sounds are normal. There is no distension.     Palpations: Abdomen is soft.     Tenderness: There is no abdominal tenderness. There is no guarding or rebound.  Musculoskeletal:        General: Normal range of  motion.     Cervical back: Neck supple.  Skin:    General: Skin is warm and dry.     Capillary Refill: Capillary refill takes less than 2 seconds.     Findings: No erythema or rash.  Neurological:     General: No focal deficit present.     Mental Status: She is alert and oriented to person, place, and time.     Deep Tendon Reflexes: Reflexes normal.  Psychiatric:        Mood and Affect: Mood normal.     ED Results / Procedures / Treatments   Labs (all labs ordered are listed, but only abnormal results are displayed) Results for orders placed or performed during the hospital encounter of 02/18/24  Lipase, blood   Collection Time: 02/18/24  7:13 PM  Result Value Ref Range   Lipase 35 11 - 51 U/L  Comprehensive metabolic panel   Collection Time: 02/18/24  7:13 PM  Result Value Ref Range   Sodium 127 (L) 135 - 145 mmol/L   Potassium 4.0 3.5 - 5.1 mmol/L   Chloride 94 (L) 98 - 111 mmol/L   CO2 22 22 - 32 mmol/L   Glucose, Bld 374 (H) 70 - 99 mg/dL   BUN 15 6 - 20 mg/dL   Creatinine, Ser 1.61 (H) 0.44 - 1.00 mg/dL   Calcium  9.3 8.9 - 10.3 mg/dL   Total Protein 7.4 6.5 - 8.1 g/dL   Albumin 3.6 3.5 - 5.0 g/dL   AST 14 (L) 15 - 41 U/L   ALT 17 0 - 44 U/L   Alkaline Phosphatase 72 38 - 126 U/L   Total Bilirubin 0.6 0.0 - 1.2 mg/dL   GFR, Estimated 49 (L) >60 mL/min   Anion gap 11 5 - 15  CBC   Collection Time: 02/18/24  7:13 PM  Result Value Ref Range   WBC 16.9 (H) 4.0 - 10.5 K/uL   RBC 4.47 3.87 - 5.11 MIL/uL   Hemoglobin 12.6 12.0 - 15.0 g/dL   HCT 09.6 04.5 - 40.9 %   MCV 86.8 80.0 - 100.0 fL   MCH 28.2 26.0 - 34.0 pg   MCHC 32.5 30.0 - 36.0 g/dL   RDW 81.1 91.4 - 78.2 %   Platelets 404 (H) 150 - 400 K/uL   nRBC 0.0 0.0 - 0.2 %  hCG, serum, qualitative   Collection Time: 02/18/24  7:13 PM  Result Value Ref Range   Preg, Serum NEGATIVE NEGATIVE  Urinalysis, w/ Reflex to Culture (Infection Suspected) -Urine, Clean Catch   Collection Time: 02/18/24  7:51 PM  Result  Value Ref Range   Specimen Source URINE,  CLEAN CATCH    Color, Urine AMBER (A) YELLOW   APPearance HAZY (A) CLEAR   Specific Gravity, Urine 1.020 1.005 - 1.030   pH 6.0 5.0 - 8.0   Glucose, UA >=500 (A) NEGATIVE mg/dL   Hgb urine dipstick LARGE (A) NEGATIVE   Bilirubin Urine NEGATIVE NEGATIVE   Ketones, ur 15 (A) NEGATIVE mg/dL   Protein, ur >409 (A) NEGATIVE mg/dL   Nitrite POSITIVE (A) NEGATIVE   Leukocytes,Ua SMALL (A) NEGATIVE   Squamous Epithelial / HPF 6-10 0 - 5 /HPF   WBC, UA >50 0 - 5 WBC/hpf   RBC / HPF 21-50 0 - 5 RBC/hpf   Bacteria, UA MANY (A) NONE SEEN   WBC Clumps PRESENT   Comprehensive metabolic panel   Collection Time: 02/19/24  3:29 AM  Result Value Ref Range   Sodium 128 (L) 135 - 145 mmol/L   Potassium 3.8 3.5 - 5.1 mmol/L   Chloride 92 (L) 98 - 111 mmol/L   CO2 21 (L) 22 - 32 mmol/L   Glucose, Bld 366 (H) 70 - 99 mg/dL   BUN 19 6 - 20 mg/dL   Creatinine, Ser 8.11 (H) 0.44 - 1.00 mg/dL   Calcium  9.7 8.9 - 10.3 mg/dL   Total Protein 8.1 6.5 - 8.1 g/dL   Albumin 4.0 3.5 - 5.0 g/dL   AST 15 15 - 41 U/L   ALT 18 0 - 44 U/L   Alkaline Phosphatase 76 38 - 126 U/L   Total Bilirubin 0.9 0.0 - 1.2 mg/dL   GFR, Estimated 35 (L) >60 mL/min   Anion gap 15 5 - 15  CBC with Differential   Collection Time: 02/19/24  3:29 AM  Result Value Ref Range   WBC 26.0 (H) 4.0 - 10.5 K/uL   RBC 3.68 (L) 3.87 - 5.11 MIL/uL   Hemoglobin 10.6 (L) 12.0 - 15.0 g/dL   HCT 91.4 (L) 78.2 - 95.6 %   MCV 87.0 80.0 - 100.0 fL   MCH 28.8 26.0 - 34.0 pg   MCHC 33.1 30.0 - 36.0 g/dL   RDW 21.3 08.6 - 57.8 %   Platelets 380 150 - 400 K/uL   nRBC 0.0 0.0 - 0.2 %   Neutrophils Relative % 88 %   Neutro Abs 23.1 (H) 1.7 - 7.7 K/uL   Lymphocytes Relative 5 %   Lymphs Abs 1.2 0.7 - 4.0 K/uL   Monocytes Relative 6 %   Monocytes Absolute 1.4 (H) 0.1 - 1.0 K/uL   Eosinophils Relative 0 %   Eosinophils Absolute 0.0 0.0 - 0.5 K/uL   Basophils Relative 0 %   Basophils Absolute 0.1 0.0 -  0.1 K/uL   WBC Morphology MORPHOLOGY UNREMARKABLE    RBC Morphology MORPHOLOGY UNREMARKABLE    Smear Review Normal platelet morphology    Immature Granulocytes 1 %   Abs Immature Granulocytes 0.15 (H) 0.00 - 0.07 K/uL  Protime-INR   Collection Time: 02/19/24  3:29 AM  Result Value Ref Range   Prothrombin Time 13.8 11.4 - 15.2 seconds   INR 1.0 0.8 - 1.2  Resp panel by RT-PCR (RSV, Flu A&B, Covid) Anterior Nasal Swab   Collection Time: 02/19/24  3:31 AM   Specimen: Anterior Nasal Swab  Result Value Ref Range   SARS Coronavirus 2 by RT PCR NEGATIVE NEGATIVE   Influenza A by PCR NEGATIVE NEGATIVE   Influenza B by PCR NEGATIVE NEGATIVE   Resp Syncytial Virus by PCR NEGATIVE NEGATIVE  I-Stat Lactic  Acid, ED   Collection Time: 02/19/24  3:33 AM  Result Value Ref Range   Lactic Acid, Venous 2.2 (HH) 0.5 - 1.9 mmol/L   Comment NOTIFIED PHYSICIAN   I-Stat CG4 Lactic Acid   Collection Time: 02/19/24  5:07 AM  Result Value Ref Range   Lactic Acid, Venous 2.4 (HH) 0.5 - 1.9 mmol/L   Comment NOTIFIED PHYSICIAN    CT RENAL STONE STUDY Result Date: 02/19/2024 CLINICAL DATA:  Abdominal/flank pain with stone suspected. UTI with sepsis. EXAM: CT ABDOMEN AND PELVIS WITHOUT CONTRAST TECHNIQUE: Multidetector CT imaging of the abdomen and pelvis was performed following the standard protocol without IV contrast. RADIATION DOSE REDUCTION: This exam was performed according to the departmental dose-optimization program which includes automated exposure control, adjustment of the mA and/or kV according to patient size and/or use of iterative reconstruction technique. COMPARISON:  04/09/2013 abdominal CT FINDINGS: Lower chest:  No contributory findings. Hepatobiliary: No focal liver abnormality.No evidence of biliary obstruction or stone. Pancreas: Unremarkable. Spleen: Unremarkable. Adrenals/Urinary Tract: Negative adrenals. Left hydroureteronephrosis to the level of the urinary bladder. No calcified stone.  Extensive right renal cortical scarring with lobulation and thinning. Mild right hydroureteronephrosis. Collapsed urinary bladder with extensive perivesicular fat stranding, there is history of UTI. Stomach/Bowel:  No obstruction. No appendicitis. Vascular/Lymphatic: No acute vascular abnormality. No mass or adenopathy. Reproductive:5.1 cm cystic density at the left adnexa/ovary Other: No ascites or pneumoperitoneum. Musculoskeletal: No acute abnormalities. Lumbar spine degeneration, especially L4-5 facet osteoarthritis. IMPRESSION: Mild left more than right hydroureteronephrosis to the level of the collapsed urinary bladder which shows prominent cystitis findings. No calculus. Extensive right renal cortical scarring. Cystic density at the left ovary measuring up to 5.1 cm, recommend pelvic ultrasound in 6-12 weeks in this perimenopausal age patient. Electronically Signed   By: Ronnette Coke M.D.   On: 02/19/2024 05:22   DG Chest Port 1 View Result Date: 02/19/2024 CLINICAL DATA:  Possible sepsis EXAM: PORTABLE CHEST 1 VIEW COMPARISON:  12/27/2020 FINDINGS: The heart size and mediastinal contours are within normal limits. Both lungs are clear. The visualized skeletal structures are unremarkable. IMPRESSION: No active disease. Electronically Signed   By: Violeta Grey M.D.   On: 02/19/2024 03:48    EKG EKG Interpretation Date/Time:  Wednesday Feb 19 2024 03:43:28 EDT Ventricular Rate:  123 PR Interval:  164 QRS Duration:  77 QT Interval:  293 QTC Calculation: 420 R Axis:   70  Text Interpretation: Sinus tachycardia Borderline repolarization abnormality Confirmed by Rudolph Dobler ([id number]) on 02/19/2024 4:00:04 AM  Radiology CT RENAL STONE STUDY Result Date: 02/19/2024 CLINICAL DATA:  Abdominal/flank pain with stone suspected. UTI with sepsis. EXAM: CT ABDOMEN AND PELVIS WITHOUT CONTRAST TECHNIQUE: Multidetector CT imaging of the abdomen and pelvis was performed following the standard protocol without  IV contrast. RADIATION DOSE REDUCTION: This exam was performed according to the departmental dose-optimization program which includes automated exposure control, adjustment of the mA and/or kV according to patient size and/or use of iterative reconstruction technique. COMPARISON:  04/09/2013 abdominal CT FINDINGS: Lower chest:  No contributory findings. Hepatobiliary: No focal liver abnormality.No evidence of biliary obstruction or stone. Pancreas: Unremarkable. Spleen: Unremarkable. Adrenals/Urinary Tract: Negative adrenals. Left hydroureteronephrosis to the level of the urinary bladder. No calcified stone. Extensive right renal cortical scarring with lobulation and thinning. Mild right hydroureteronephrosis. Collapsed urinary bladder with extensive perivesicular fat stranding, there is history of UTI. Stomach/Bowel:  No obstruction. No appendicitis. Vascular/Lymphatic: No acute vascular abnormality. No mass or adenopathy. Reproductive:5.1 cm cystic  density at the left adnexa/ovary Other: No ascites or pneumoperitoneum. Musculoskeletal: No acute abnormalities. Lumbar spine degeneration, especially L4-5 facet osteoarthritis. IMPRESSION: Mild left more than right hydroureteronephrosis to the level of the collapsed urinary bladder which shows prominent cystitis findings. No calculus. Extensive right renal cortical scarring. Cystic density at the left ovary measuring up to 5.1 cm, recommend pelvic ultrasound in 6-12 weeks in this perimenopausal age patient. Electronically Signed   By: Ronnette Coke M.D.   On: 02/19/2024 05:22   DG Chest Port 1 View Result Date: 02/19/2024 CLINICAL DATA:  Possible sepsis EXAM: PORTABLE CHEST 1 VIEW COMPARISON:  12/27/2020 FINDINGS: The heart size and mediastinal contours are within normal limits. Both lungs are clear. The visualized skeletal structures are unremarkable. IMPRESSION: No active disease. Electronically Signed   By: Violeta Grey M.D.   On: 02/19/2024 03:48     Procedures .Critical Care  Performed by: Tonya Fredrickson, MD Authorized by: Tonya Fredrickson, MD   Critical care provider statement:    Critical care time (minutes):  30   Critical care end time:  02/19/2024 6:47 AM   Critical care was necessary to treat or prevent imminent or life-threatening deterioration of the following conditions:  Sepsis   Critical care was time spent personally by me on the following activities:  Development of treatment plan with patient or surrogate, discussions with consultants, evaluation of patient's response to treatment, examination of patient, ordering and review of laboratory studies, ordering and review of radiographic studies, ordering and performing treatments and interventions, pulse oximetry, re-evaluation of patient's condition and review of old charts   I assumed direction of critical care for this patient from another provider in my specialty: no     Care discussed with: admitting provider       Medications Ordered in ED Medications  lactated ringers infusion ( Intravenous New Bag/Given 02/19/24 0352)  lactated ringers bolus 1,000 mL (0 mLs Intravenous Stopped 02/19/24 0420)    And  lactated ringers bolus 1,000 mL (0 mLs Intravenous Stopped 02/19/24 0436)    And  lactated ringers bolus 1,000 mL (0 mLs Intravenous Stopped 02/19/24 0436)  cefTRIAXone (ROCEPHIN) 2 g in sodium chloride  0.9 % 100 mL IVPB (0 g Intravenous Stopped 02/19/24 0349)  ketorolac (TORADOL) 30 MG/ML injection 30 mg (30 mg Intravenous Given 02/19/24 0343)    ED Course/ Medical Decision Making/ A&P                                 Medical Decision Making Patient with chills and urinary symptoms   Amount and/or Complexity of Data Reviewed External Data Reviewed: notes.    Details: Previous notes reviewed.   Labs: ordered.    Details: Urine is consistent with UTI.  Elevated lactate 2.2 and 2.41, negative covid and flu.  Normal coagulation studies.  Elevated white count 16.9, normal  hemoglobin 12.6, low sodium 127, elevated creatinine 1.31 Radiology: ordered and independent interpretation performed.    Details: Negative CXR by me  ECG/medicine tests: ordered and independent interpretation performed. Decision-making details documented in ED Course.  Risk Prescription drug management. Decision regarding hospitalization.    Final Clinical Impression(s) / ED Diagnoses Final diagnoses:  Sepsis, due to unspecified organism, unspecified whether acute organ dysfunction present (HCC)  Acute cystitis without hematuria  Hyponatremia  AKI (acute kidney injury) (HCC)   The patient appears reasonably stabilized for admission considering the current resources, flow, and capabilities available in  the ED at this time, and I doubt any other [facility] requiring further screening and/or treatment in the ED prior to admission.  Rx / DC Orders ED Discharge Orders     None         Avalyn Molino, MD 02/19/24 1610    Maralee Senate, Annelie Boak, MD 02/19/24 9604

## 2024-02-19 NOTE — Sepsis Progress Note (Signed)
 Notified provider of need to order repeat lactic acid.

## 2024-02-19 NOTE — Telephone Encounter (Signed)
 Patient Product/process development scientist completed.    The patient is insured through [employer].     Ran test claim for Dexcom G7 Sensor and Requires Prior Authorization  Ran test claim for [employer] 3 Plus Sensor and Requires Prior Authorization  This test claim was processed through [employer]- copay amounts may vary at other pharmacies due to [employer], or as the patient moves through the different stages of their insurance plan.     Morgan Arab, CPHT Pharmacy Technician III Certified Patient Advocate [facility] Pharmacy Patient Advocate Team Direct Number: [phone]  Fax: [phone]

## 2024-02-19 NOTE — Inpatient Diabetes Management (Signed)
 Inpatient Diabetes Program Recommendations  AACE/ADA: New Consensus Statement on Inpatient Glycemic Control (2015)  Target Ranges:  Prepandial:   less than 140 mg/dL      Peak postprandial:   less than 180 mg/dL (1-2 hours)      Critically ill patients:  140 - 180 mg/dL   Lab Results  Component Value Date   GLUCAP 207 (H) 02/19/2024   HGBA1C 9.0 (H) 02/19/2024    Review of Glycemic Control  Latest Reference Range & Units 02/19/24 08:07 02/19/24 10:55 02/19/24 12:15 02/19/24 13:49  Glucose-Capillary 70 - 99 mg/dL 161 (H) 096 (H) 045 (H) 207 (H)  (H): Data is abnormally high Diabetes history: Type 2 DM Outpatient Diabetes medications: Ozempic  1 mg Qwk Current orders for Inpatient glycemic control: Semglee 10 units every day, Novolog 0-15 units TID  Inpatient Diabetes Program Recommendations:    Spoke with patient regarding outpatient diabetes management. Patient over the last month switched from Mounjaro  to Ozempic  due to issues obtaining medication from pharmacy. Unfortunately, patient had to restart on dosage and continue to increase. She states her blood sugars were running high because of this change.  Reviewed patient's previous A1C of 10.4% and current A1c of 9.0%. Explained what a A1c is and what it measures. Also reviewed goal A1c with patient, importance of good glucose control @ home, and blood sugar goals. Reviewed patho of DM, need for improved control, impact of glucose and infection risks, vascular changes and commorbidities.  Patient has a meter but is interested in CGM. Pharmacy notified and running benefits check.  Denies drinking sugary beverages. Works to try to be mindful of CHO. Encouraged additional protein when able. Has another follow up with PCP.   Thanks, Marjo Sievert, MSN, RNC-OB Diabetes Coordinator [phone] (8a-5p)

## 2024-02-20 ENCOUNTER — Encounter (HOSPITAL_COMMUNITY): Payer: Self-pay | Admitting: Hospitalist

## 2024-02-20 ENCOUNTER — Telehealth (HOSPITAL_COMMUNITY): Payer: Self-pay | Admitting: Pharmacy Technician

## 2024-02-20 ENCOUNTER — Other Ambulatory Visit (HOSPITAL_COMMUNITY): Payer: Self-pay

## 2024-02-20 DIAGNOSIS — N390 Urinary tract infection, site not specified: Secondary | ICD-10-CM | POA: Diagnosis not present

## 2024-02-20 DIAGNOSIS — A419 Sepsis, unspecified organism: Secondary | ICD-10-CM | POA: Diagnosis not present

## 2024-02-20 LAB — BASIC METABOLIC PANEL WITH GFR
Anion gap: 12 (ref 5–15)
BUN: 19 mg/dL (ref 6–20)
CO2: 23 mmol/L (ref 22–32)
Calcium: 8.8 mg/dL — ABNORMAL LOW (ref 8.9–10.3)
Chloride: 98 mmol/L (ref 98–111)
Creatinine, Ser: 1.6 mg/dL — ABNORMAL HIGH (ref 0.44–1.00)
GFR, Estimated: 39 mL/min — ABNORMAL LOW (ref >60)
Glucose, Bld: 157 mg/dL — ABNORMAL HIGH (ref 70–99)
Potassium: 3.8 mmol/L (ref 3.5–5.1)
Sodium: 133 mmol/L — ABNORMAL LOW (ref 135–145)

## 2024-02-20 LAB — GLUCOSE, CAPILLARY
Glucose-Capillary: 177 mg/dL — ABNORMAL HIGH (ref 70–99)
Glucose-Capillary: 159 mg/dL — ABNORMAL HIGH (ref 70–99)
Glucose-Capillary: 134 mg/dL — ABNORMAL HIGH (ref 70–99)
Glucose-Capillary: 115 mg/dL — ABNORMAL HIGH (ref 70–99)

## 2024-02-20 LAB — CBC
HCT: 36.2 % (ref 36.0–46.0)
Hemoglobin: 12 g/dL (ref 12.0–15.0)
MCH: 28.6 pg (ref 26.0–34.0)
MCHC: 33.1 g/dL (ref 30.0–36.0)
MCV: 86.2 fL (ref 80.0–100.0)
Platelets: 357 10*3/uL (ref 150–400)
RBC: 4.2 MIL/uL (ref 3.87–5.11)
RDW: 13.3 % (ref 11.5–15.5)
WBC: 15 10*3/uL — ABNORMAL HIGH (ref 4.0–10.5)
nRBC: 0 % (ref 0.0–0.2)

## 2024-02-20 LAB — LACTIC ACID, PLASMA: Lactic Acid, Venous: 1.2 mmol/L (ref 0.5–1.9)

## 2024-02-20 MED ORDER — POTASSIUM CHLORIDE IN NACL 20-0.9 MEQ/L-% IV SOLN
INTRAVENOUS | Status: AC
  Filled 2024-02-20 (×2): qty 1000

## 2024-02-20 MED ORDER — MORPHINE SULFATE (PF) 2 MG/ML IV SOLN: 1.0000 mg | INTRAVENOUS | Status: DC | PRN

## 2024-02-20 MED ORDER — OXYCODONE HCL 5 MG PO TABS
5.0000 mg | ORAL_TABLET | ORAL | Status: DC | PRN
  Administered 2024-02-20: 10 mg via ORAL
  Administered 2024-02-20 (×2): 5 mg via ORAL
  Administered 2024-02-21 – 2024-02-22 (×7): 10 mg via ORAL
  Filled 2024-02-20 (×6): qty 2
  Filled 2024-02-20: qty 1
  Filled 2024-02-20 (×3): qty 2

## 2024-02-20 NOTE — Telephone Encounter (Signed)
 Pharmacy Patient Advocate Encounter  Received notification from [facility] that Prior Authorization for FreeStyle Libre 3 Plus Sensor  has been APPROVED from 02/20/2024 to 08/22/2024. Ran test claim, Copay is $0.00. This test claim was processed through [facility]- copay amounts may vary at other pharmacies due to pharmacy/plan contracts, or as the patient moves through the different stages of their insurance plan.   PA #/Case ID/Reference #: [id number]

## 2024-02-20 NOTE — Plan of Care (Signed)
  Problem: Pain Managment: Goal: General experience of comfort will improve and/or be controlled Outcome: Progressing   Problem: Safety: Goal: Ability to remain free from injury will improve Outcome: Progressing   Problem: Skin Integrity: Goal: Risk for impaired skin integrity will decrease Outcome: Progressing

## 2024-02-20 NOTE — Progress Notes (Signed)
   02/20/24 1357  TOC Brief Assessment  Insurance and Status Reviewed  Patient has primary care physician Yes  Home environment has been reviewed spouse  Prior level of function: independent  Prior/Current Home Services No current home services  Social Drivers of Health Review SDOH reviewed no interventions necessary  Readmission risk has been reviewed Yes  Transition of care needs no transition of care needs at this time     Transition of Care Department [facility]) has reviewed patient and no TOC needs have been identified at this time. We will continue to monitor patient advancement through interdisciplinary progression rounds. If new patient transition needs arise, please place a TOC consult.

## 2024-02-20 NOTE — Telephone Encounter (Signed)
 Pharmacy Patient Advocate Encounter   Received notification from Inpatient Request that prior authorization for FreeStyle Libre 3 Plus Sensor is required/requested.   Insurance verification completed.   The patient is insured through [facility] .   Per test claim: PA required; PA submitted to above mentioned insurance via CoverMyMeds Key/confirmation #/EOC [id number] Status is pending

## 2024-02-20 NOTE — Progress Notes (Signed)
 PROGRESS NOTE    Kelsey Olson  ZOX:[id number] DOB: 04-Aug-1971 DOA: 02/18/2024 PCP: Susanna Epley, FNP    Brief Narrative:   Kelsey Olson is a 53 y.o. female with past medical history significant for HTN, DM2 who presented to [facility] ED on 02/18/2024 with complaints of bilateral flank pain, nausea/vomiting, body aches, fever/chills, dysuria.  Onset of symptoms 02/16/2024.  She was seen by telemedicine and prescribed nitrofurantoin which she took a few doses however her symptoms continue to progress.  In the ED, temperature 98.6 F, HR 105, RR 20, BP 134/56, SpO2 99% on room air.  WBC 26.0, hemoglobin 10.6, platelet count 380.  Sodium 127, potassium 4.0, chloride 94, CO2 22, glucose 374, BUN 15, creatinine 1.31.  AST 14, ALT 17, total bilirubin 0.6.  Lipase 35.  Lactic acid 2.2.  hCG negative.  COVID/influenza/RSV PCR negative.  Urinalysis with large hemoglobin, small leukocytes, positive nitrite, greater than 300 protein, many bacteria, greater than 50 WBCs.  Chest x-ray with no active cardiopulmonary disease process.  CT renal stone study with mild left greater than right hydroureteronephrosis to the level of collapsed urinary bladder with prominent cystitis, no calculus, extensive right renal cortical scarring, cystic density left ovary measuring up to 5.1 cm.  Assessment & Plan:   Sepsis secondary to urinary tract infection Patient presenting with fever, chills, dysuria, body aches associated with nausea and vomiting.  Patient was afebrile on ED arrival with tachycardia, elevated WC count of 26.0 with lactic acid 2.2.  Urinalysis consistent with urinary tract infection. -- CBC 16.9>26.0>14.5>15.0 -- Blood cultures x 2: Pending -- Urine culture not obtained prior to start of antibiotics, ordered as add-on -- Ceftriaxone 2 g IV every 24 hours -- Supportive care, antiemetics, pain control with oxycodone /morphine   Hyponatremia Sodium 127 on admission likely secondary to hypovolemic hyponatremia  in the setting of poor oral intake in the days preceding hospitalization.  Also likely complicated by antihypertensive use with HCTZ. -- Na [phone] -- NS with 20 mEq KCl at 75 mL/h -- Continue to encourage increased oral intake -- BMP in am  Acute renal failure on CKD stage II Baseline creatinine 1.1.  Creatinine on admission elevated 1.31. -- Cr 1.31>1.76>1.87>1.55 -- Continue IV fluid hydration --Hold home lisinopril -HCTZ -- Avoid nephrotoxins, renally dose all medications -- BMP daily  Essential hypertension On lisinopril -HCTZ 20-12.5 mg p.o. daily at baseline. -- Holding lisinopril /HCTZ in the setting of acute renal failure, hyponatremia as above. -- Continue to monitor BP closely, 126/92 this morning.  Left ovarian cyst Incidental finding of 5.1 cystic density left ovary.  Recommend outpatient pelvic ultrasound in 6-12 months.  Type 2 diabetes mellitus, poorly controlled Hemoglobin A1c 9.0.  On semaglutide  outpatient. -- Diabetic educator following, appreciate assistance -- Semglee 10 units [place] daily -- Moderate SSI for coverage -- CBG before every meal/at bedtime  Hyperlipidemia -- Atorvastatin  40 mg p.o. daily  Obesity, class II Body mass index is 34.95 kg/m.   DVT prophylaxis: enoxaparin (LOVENOX) injection 40 mg Start: 02/19/24 0900    Code Status: Full Code Family Communication: No family present at bedside this morning  Disposition Plan:  Level of care: Med-Surg Status is: Inpatient Remains inpatient appropriate because: IV antibiotics    Consultants:  None  Procedures:  None  Antimicrobials:  Ceftriaxone 5/6>>   Subjective: Patient seen and examined at bedside, lying in bed.  Continues to complain of flank pain but overall symptoms much improved since admission.  Patient reports that she is "hungry" and would like  to start a diet.  No other specific questions, concerns or complaints at this time.  Denies headache, no fever/chills/night sweats,  no nausea/vomit/diarrhea, no chest pain, no palpitations, no shortness of breath, no abdominal pain, no focal weakness, no fatigue, no paresthesia.  No acute events overnight per nursing staff.  Objective: Vitals:   02/20/24 0033 02/20/24 0429 02/20/24 0550 02/20/24 0743  BP: 129/64 (!) 126/42  (!) 103/43  Pulse: (!) 117 (!) 119  (!) 109  Resp: 17 16  17   Temp: 99.5 F (37.5 C) 99.3 F (37.4 C) 99.6 F (37.6 C) 99.1 F (37.3 C)  TempSrc: Oral Oral  Oral  SpO2: 99% 98%  97%  Weight:      Height:        Intake/Output Summary (Last 24 hours) at 02/20/2024 1056 Last data filed at 02/20/2024 0500 Gross per 24 hour  Intake 340 ml  Output 100 ml  Net 240 ml   Filed Weights   02/18/24 1854  Weight: 95.3 kg    Examination:  Physical Exam: GEN: NAD, alert and oriented x 3, obese HEENT: NCAT, PERRL, EOMI, sclera clear, MMM PULM: CTAB w/o wheezes/crackles, normal respiratory effort, room air CV: RRR w/o M/G/R GI: abd soft, NTND, + BS MSK: no peripheral edema, muscle strength globally intact 5/5 bilateral upper/lower extremities NEURO: CN II-XII intact, no focal deficits, sensation to light touch intact PSYCH: normal mood/affect Integumentary: dry/intact, no rashes or wounds    Data Reviewed: I have personally reviewed following labs and imaging studies  CBC: Recent Labs  Lab 02/18/24 1913 02/19/24 0329 02/19/24 0908  WBC 16.9* 26.0* 14.5*  NEUTROABS  --  23.1*  --   HGB 12.6 10.6* 11.2*  HCT 38.8 32.0* 33.7*  MCV 86.8 87.0 86.0  PLT 404* 380 338   Basic Metabolic Panel: Recent Labs  Lab 02/18/24 1913 02/19/24 0329 02/19/24 0908 02/19/24 2142  NA 127* 128*  --  134*  K 4.0 3.8  --  3.6  CL 94* 92*  --  101  CO2 22 21*  --  23  GLUCOSE 374* 366*  --  158*  BUN 15 19  --  17  CREATININE 1.31* 1.76* 1.87* 1.55*  CALCIUM  9.3 9.7  --  8.7*   GFR: Estimated Creatinine Clearance: 49 mL/min (A) (by C-G formula based on SCr of 1.55 mg/dL (H)). Liver Function  Tests: Recent Labs  Lab 02/18/24 1913 02/19/24 0329  AST 14* 15  ALT 17 18  ALKPHOS 72 76  BILITOT 0.6 0.9  PROT 7.4 8.1  ALBUMIN 3.6 4.0   Recent Labs  Lab 02/18/24 1913  LIPASE 35   No results for input(s): "AMMONIA" in the last 168 hours. Coagulation Profile: Recent Labs  Lab 02/19/24 0329  INR 1.0   Cardiac Enzymes: No results for input(s): "CKTOTAL", "CKMB", "CKMBINDEX", "TROPONINI" in the last 168 hours. BNP (last 3 results) No results for input(s): "PROBNP" in the last 8760 hours. HbA1C: Recent Labs    02/19/24 0908  HGBA1C 9.0*   CBG: Recent Labs  Lab 02/19/24 1215 02/19/24 1349 02/19/24 1641 02/19/24 2033 02/20/24 0738  GLUCAP 208* 207* 154* 179* 177*   Lipid Profile: No results for input(s): "CHOL", "HDL", "LDLCALC", "TRIG", "CHOLHDL", "LDLDIRECT" in the last 72 hours. Thyroid Function Tests: No results for input(s): "TSH", "T4TOTAL", "FREET4", "T3FREE", "THYROIDAB" in the last 72 hours. Anemia Panel: No results for input(s): "VITAMINB12", "FOLATE", "FERRITIN", "TIBC", "IRON", "RETICCTPCT" in the last 72 hours. Sepsis Labs: Recent Labs  Lab 02/19/24 0333 02/19/24 0507 02/19/24 0908  LATICACIDVEN 2.2* 2.4* 2.8*    Recent Results (from the past 240 hours)  Resp panel by RT-PCR (RSV, Flu A&B, Covid) Anterior Nasal Swab     Status: None   Collection Time: 02/19/24  3:31 AM   Specimen: Anterior Nasal Swab  Result Value Ref Range Status   SARS Coronavirus 2 by RT PCR NEGATIVE NEGATIVE Final   Influenza A by PCR NEGATIVE NEGATIVE Final   Influenza B by PCR NEGATIVE NEGATIVE Final    Comment: (NOTE) The Xpert Xpress SARS-CoV-2/FLU/RSV plus assay is intended as an aid in the diagnosis of influenza from Nasopharyngeal swab specimens and should not be used as a sole basis for treatment. Nasal washings and aspirates are unacceptable for Xpert Xpress SARS-CoV-2/FLU/RSV testing.  Fact Sheet for  Patients: BloggerCourse.com  Fact Sheet for Healthcare Providers: SeriousBroker.it  This test is not yet approved or cleared by the [place]  FDA and has been authorized for detection and/or diagnosis of SARS-CoV-2 by FDA under an Emergency Use Authorization (EUA). This EUA will remain in effect (meaning this test can be used) for the duration of the COVID-19 declaration under Section 564(b)(1) of the Act, 21 U.S.C. section 360bbb-3(b)(1), unless the authorization is terminated or revoked.     Resp Syncytial Virus by PCR NEGATIVE NEGATIVE Final    Comment: (NOTE) Fact Sheet for Patients: BloggerCourse.com  Fact Sheet for Healthcare Providers: SeriousBroker.it  This test is not yet approved or cleared by the [place]  FDA and has been authorized for detection and/or diagnosis of SARS-CoV-2 by FDA under an Emergency Use Authorization (EUA). This EUA will remain in effect (meaning this test can be used) for the duration of the COVID-19 declaration under Section 564(b)(1) of the Act, 21 U.S.C. section 360bbb-3(b)(1), unless the authorization is terminated or revoked.  Performed at [facility], [address] [place], [place] [address]          Radiology Studies: CT RENAL STONE STUDY Result Date: 02/19/2024 CLINICAL DATA:  Abdominal/flank pain with stone suspected. UTI with sepsis. EXAM: CT ABDOMEN AND PELVIS WITHOUT CONTRAST TECHNIQUE: Multidetector CT imaging of the abdomen and pelvis was performed following the standard protocol without IV contrast. RADIATION DOSE REDUCTION: This exam was performed according to the departmental dose-optimization program which includes automated exposure control, adjustment of the mA and/or kV according to patient size and/or use of iterative reconstruction technique. COMPARISON:  04/09/2013 abdominal CT FINDINGS: Lower chest:  No  contributory findings. Hepatobiliary: No focal liver abnormality.No evidence of biliary obstruction or stone. Pancreas: Unremarkable. Spleen: Unremarkable. Adrenals/Urinary Tract: Negative adrenals. Left hydroureteronephrosis to the level of the urinary bladder. No calcified stone. Extensive right renal cortical scarring with lobulation and thinning. Mild right hydroureteronephrosis. Collapsed urinary bladder with extensive perivesicular fat stranding, there is history of UTI. Stomach/Bowel:  No obstruction. No appendicitis. Vascular/Lymphatic: No acute vascular abnormality. No mass or adenopathy. Reproductive:5.1 cm cystic density at the left adnexa/ovary Other: No ascites or pneumoperitoneum. Musculoskeletal: No acute abnormalities. Lumbar spine degeneration, especially L4-5 facet osteoarthritis. IMPRESSION: Mild left more than right hydroureteronephrosis to the level of the collapsed urinary bladder which shows prominent cystitis findings. No calculus. Extensive right renal cortical scarring. Cystic density at the left ovary measuring up to 5.1 cm, recommend pelvic ultrasound in 6-12 weeks in this perimenopausal age patient. Electronically Signed   By: Ronnette Coke M.D.   On: 02/19/2024 05:22   DG Chest Port 1 View Result Date: 02/19/2024 CLINICAL DATA:  Possible sepsis EXAM: PORTABLE CHEST 1 VIEW COMPARISON:  12/27/2020 FINDINGS: The heart size and mediastinal contours are within normal limits. Both lungs are clear. The visualized skeletal structures are unremarkable. IMPRESSION: No active disease. Electronically Signed   By: Violeta Grey M.D.   On: 02/19/2024 03:48        Scheduled Meds:  atorvastatin   40 mg Oral QHS   cholecalciferol  1,000 Units Oral Daily   enoxaparin (LOVENOX) injection  40 mg Subcutaneous Q24H   fluticasone   2 spray Each Nare Daily   insulin aspart  0-15 Units Subcutaneous TID WC   insulin glargine-yfgn  10 Units Subcutaneous Daily   cyanocobalamin  100 mcg Oral Daily    Continuous Infusions:  0.9 % NaCl with KCl 20 mEq / L 75 mL/hr at 02/20/24 0938   cefTRIAXone (ROCEPHIN)  IV 2 g (02/19/24 2154)     LOS: 1 day    Time spent: 50 minutes spent on 02/20/2024 caring for this patient face-to-face including chart review, ordering labs/tests, documenting, discussion with nursing staff, consultants, updating family and interview/physical exam    Rema [facility] [place], DO [facility] Available via Epic secure chat 7am-7pm After these hours, please refer to coverage provider listed on [email or web address] 02/20/2024, 10:56 AM

## 2024-02-20 NOTE — Plan of Care (Signed)
  Problem: Metabolic: Goal: Ability to maintain appropriate glucose levels will improve Outcome: Progressing   Problem: Nutritional: Goal: Maintenance of adequate nutrition will improve Outcome: Progressing   Problem: Skin Integrity: Goal: Risk for impaired skin integrity will decrease Outcome: Progressing   Problem: Activity: Goal: Risk for activity intolerance will decrease Outcome: Progressing   Problem: Pain Managment: Goal: General experience of comfort will improve and/or be controlled Outcome: Progressing

## 2024-02-21 DIAGNOSIS — A419 Sepsis, unspecified organism: Secondary | ICD-10-CM | POA: Diagnosis not present

## 2024-02-21 DIAGNOSIS — N390 Urinary tract infection, site not specified: Secondary | ICD-10-CM | POA: Diagnosis not present

## 2024-02-21 LAB — GLUCOSE, CAPILLARY
Glucose-Capillary: 145 mg/dL — ABNORMAL HIGH (ref 70–99)
Glucose-Capillary: 171 mg/dL — ABNORMAL HIGH (ref 70–99)
Glucose-Capillary: 135 mg/dL — ABNORMAL HIGH (ref 70–99)
Glucose-Capillary: 181 mg/dL — ABNORMAL HIGH (ref 70–99)

## 2024-02-21 LAB — CBC
HCT: 29.8 % — ABNORMAL LOW (ref 36.0–46.0)
Hemoglobin: 9.9 g/dL — ABNORMAL LOW (ref 12.0–15.0)
MCH: 28.7 pg (ref 26.0–34.0)
MCHC: 33.2 g/dL (ref 30.0–36.0)
MCV: 86.4 fL (ref 80.0–100.0)
Platelets: 307 10*3/uL (ref 150–400)
RBC: 3.45 MIL/uL — ABNORMAL LOW (ref 3.87–5.11)
RDW: 13.6 % (ref 11.5–15.5)
WBC: 8.6 10*3/uL (ref 4.0–10.5)
nRBC: 0 % (ref 0.0–0.2)

## 2024-02-21 LAB — MAGNESIUM: Magnesium: 1.7 mg/dL (ref 1.7–2.4)

## 2024-02-21 LAB — BASIC METABOLIC PANEL WITH GFR
Anion gap: 11 (ref 5–15)
BUN: 14 mg/dL (ref 6–20)
CO2: 21 mmol/L — ABNORMAL LOW (ref 22–32)
Calcium: 8.2 mg/dL — ABNORMAL LOW (ref 8.9–10.3)
Chloride: 102 mmol/L (ref 98–111)
Creatinine, Ser: 1.4 mg/dL — ABNORMAL HIGH (ref 0.44–1.00)
GFR, Estimated: 46 mL/min — ABNORMAL LOW (ref >60)
Glucose, Bld: 152 mg/dL — ABNORMAL HIGH (ref 70–99)
Potassium: 3.8 mmol/L (ref 3.5–5.1)
Sodium: 134 mmol/L — ABNORMAL LOW (ref 135–145)

## 2024-02-21 MED ORDER — POTASSIUM CHLORIDE IN NACL 20-0.9 MEQ/L-% IV SOLN
INTRAVENOUS | Status: DC
  Filled 2024-02-21: qty 1000

## 2024-02-21 MED ORDER — DOCUSATE SODIUM 100 MG PO CAPS
100.0000 mg | ORAL_CAPSULE | Freq: Every day | ORAL | Status: DC | PRN
  Administered 2024-02-21: 100 mg via ORAL
  Filled 2024-02-21: qty 1

## 2024-02-21 NOTE — Plan of Care (Signed)
  Problem: Metabolic: Goal: Ability to maintain appropriate glucose levels will improve Outcome: Progressing   Problem: Activity: Goal: Risk for activity intolerance will decrease Outcome: Progressing   Problem: Coping: Goal: Level of anxiety will decrease Outcome: Progressing   Problem: Pain Managment: Goal: General experience of comfort will improve and/or be controlled Outcome: Progressing

## 2024-02-21 NOTE — Progress Notes (Signed)
 PROGRESS NOTE    Kelsey Olson  WUJ:[id number] DOB: [birth year] DOA: 02/18/2024 PCP: Susanna Epley, FNP    Brief Narrative:   Kelsey Olson is a 53 y.o. female with past medical history significant for HTN, DM2 who presented to [facility] ED on 02/18/2024 with complaints of bilateral flank pain, nausea/vomiting, body aches, fever/chills, dysuria.  Onset of symptoms 02/16/2024.  She was seen by telemedicine and prescribed nitrofurantoin which she took a few doses however her symptoms continue to progress.  In the ED, temperature 98.6 F, HR 105, RR 20, BP 134/56, SpO2 99% on room air.  WBC 26.0, hemoglobin 10.6, platelet count 380.  Sodium 127, potassium 4.0, chloride 94, CO2 22, glucose 374, BUN 15, creatinine 1.31.  AST 14, ALT 17, total bilirubin 0.6.  Lipase 35.  Lactic acid 2.2.  hCG negative.  COVID/influenza/RSV PCR negative.  Urinalysis with large hemoglobin, small leukocytes, positive nitrite, greater than 300 protein, many bacteria, greater than 50 WBCs.  Chest x-ray with no active cardiopulmonary disease process.  CT renal stone study with mild left greater than right hydroureteronephrosis to the level of collapsed urinary bladder with prominent cystitis, no calculus, extensive right renal cortical scarring, cystic density left ovary measuring up to 5.1 cm.  Assessment & Plan:   Sepsis secondary to urinary tract infection Patient presenting with fever, chills, dysuria, body aches associated with nausea and vomiting.  Patient was afebrile on ED arrival with tachycardia, elevated WC count of 26.0 with lactic acid 2.2.  Urinalysis consistent with urinary tract infection. -- CBC 16.9>26.0>14.5>15.0>8.6 -- Blood cultures x 2: No growth less than 24 hours -- Urine culture not obtained prior to start of antibiotics, ordered as add-on -- Ceftriaxone  2 g IV every 24 hours -- Supportive care, antiemetics, pain control with oxycodone /morphine   Hyponatremia Sodium 127 on admission likely secondary to  hypovolemic hyponatremia in the setting of poor oral intake in the days preceding hospitalization.  Also likely complicated by antihypertensive use with HCTZ. -- Na [phone] -- NS with 20 mEq KCl at 75 mL/h -- Continue to encourage increased oral intake -- BMP in am  Acute renal failure on CKD stage II Baseline creatinine 1.1.  Creatinine on admission elevated 1.31. -- Cr 1.31>1.76>1.87>1.55>1.40 -- Continue IV fluid hydration -- Hold home lisinopril -HCTZ -- Avoid nephrotoxins, renally dose all medications -- BMP daily  Essential hypertension On lisinopril -HCTZ 20-12.5 mg p.o. daily at baseline. -- Holding lisinopril /HCTZ in the setting of acute renal failure, hyponatremia as above. -- Continue to monitor BP closely, 111/55 this morning.  Left ovarian cyst Incidental finding of 5.1 cystic density left ovary.  Recommend outpatient pelvic ultrasound in 6-12 months.  Type 2 diabetes mellitus, poorly controlled Hemoglobin A1c 9.0.  On semaglutide  outpatient. -- Diabetic educator following, appreciate assistance -- Semglee  10 units [place] daily -- Moderate SSI for coverage -- CBG before every meal/at bedtime  Hyperlipidemia -- Atorvastatin  40 mg p.o. daily  Obesity, class II Body mass index is 34.95 kg/m.   DVT prophylaxis: enoxaparin  (LOVENOX ) injection 40 mg Start: 02/19/24 0900    Code Status: Full Code Family Communication: No family present at bedside this morning  Disposition Plan:  Level of care: Med-Surg Status is: Inpatient Remains inpatient appropriate because: IV antibiotics    Consultants:  None  Procedures:  None  Antimicrobials:  Ceftriaxone  5/6>>   Subjective: Patient seen and examined at bedside, lying in bed.  Tmax reported as 100.5 degrees past 24 hours although patient states was taken by ENT overnight and  was 103.0 F but this is not documented.  Discussed with daytime RN and was not passed to her during report today.  Discussed that otherwise  her white blood cell count has now normalized and will continue for another 24 hours of inpatient hospitalization with IV antibiotics with potential discharge home tomorrow if remains afebrile. No other specific questions, concerns or complaints at this time.  Denies headache, no fever/chills/night sweats, no nausea/vomit/diarrhea, no chest pain, no palpitations, no shortness of breath, no abdominal pain, no focal weakness, no fatigue, no paresthesia.  No acute events overnight per nursing staff.  Objective: Vitals:   02/20/24 2000 02/21/24 0002 02/21/24 0802 02/21/24 1207  BP: 132/63 127/73 (!) 138/91 (!) 111/55  Pulse: (!) 118 100 (!) 113 95  Resp: 18 17 17 17   Temp: (!) 100.5 F (38.1 C) 99 F (37.2 C) 99.7 F (37.6 C) 99.1 F (37.3 C)  TempSrc: Oral Oral Oral Oral  SpO2: 97% 96% 95% 97%  Weight:      Height:        Intake/Output Summary (Last 24 hours) at 02/21/2024 1243 Last data filed at 02/20/2024 1505 Gross per 24 hour  Intake 396.9 ml  Output --  Net 396.9 ml   Filed Weights   02/18/24 1854  Weight: 95.3 kg    Examination:  Physical Exam: GEN: NAD, alert and oriented x 3, obese HEENT: NCAT, PERRL, EOMI, sclera clear, MMM PULM: CTAB w/o wheezes/crackles, normal respiratory effort, room air CV: RRR w/o M/G/R GI: abd soft, NTND, + BS MSK: no peripheral edema, muscle strength globally intact 5/5 bilateral upper/lower extremities NEURO: CN II-XII intact, no focal deficits, sensation to light touch intact PSYCH: normal mood/affect Integumentary: dry/intact, no rashes or wounds    Data Reviewed: I have personally reviewed following labs and imaging studies  CBC: Recent Labs  Lab 02/18/24 1913 02/19/24 0329 02/19/24 0908 02/20/24 1029 02/21/24 0639  WBC 16.9* 26.0* 14.5* 15.0* 8.6  NEUTROABS  --  23.1*  --   --   --   HGB 12.6 10.6* 11.2* 12.0 9.9*  HCT 38.8 32.0* 33.7* 36.2 29.8*  MCV 86.8 87.0 86.0 86.2 86.4  PLT 404* 380 338 357 307   Basic Metabolic  Panel: Recent Labs  Lab 02/18/24 1913 02/19/24 0329 02/19/24 0908 02/19/24 2142 02/20/24 1029 02/21/24 0639  NA 127* 128*  --  134* 133* 134*  K 4.0 3.8  --  3.6 3.8 3.8  CL 94* 92*  --  101 98 102  CO2 22 21*  --  23 23 21*  GLUCOSE 374* 366*  --  158* 157* 152*  BUN 15 19  --  17 19 14   CREATININE 1.31* 1.76* 1.87* 1.55* 1.60* 1.40*  CALCIUM  9.3 9.7  --  8.7* 8.8* 8.2*  MG  --   --   --   --   --  1.7   GFR: Estimated Creatinine Clearance: 54.3 mL/min (A) (by C-G formula based on SCr of 1.4 mg/dL (H)). Liver Function Tests: Recent Labs  Lab 02/18/24 1913 02/19/24 0329  AST 14* 15  ALT 17 18  ALKPHOS 72 76  BILITOT 0.6 0.9  PROT 7.4 8.1  ALBUMIN 3.6 4.0   Recent Labs  Lab 02/18/24 1913  LIPASE 35   No results for input(s): "AMMONIA" in the last 168 hours. Coagulation Profile: Recent Labs  Lab 02/19/24 0329  INR 1.0   Cardiac Enzymes: No results for input(s): "CKTOTAL", "CKMB", "CKMBINDEX", "TROPONINI" in the last 168 hours.  BNP (last 3 results) No results for input(s): "PROBNP" in the last 8760 hours. HbA1C: Recent Labs    02/19/24 0908  HGBA1C 9.0*   CBG: Recent Labs  Lab 02/20/24 1152 02/20/24 1702 02/20/24 2009 02/21/24 0801 02/21/24 1203  GLUCAP 159* 134* 115* 145* 171*   Lipid Profile: No results for input(s): "CHOL", "HDL", "LDLCALC", "TRIG", "CHOLHDL", "LDLDIRECT" in the last 72 hours. Thyroid Function Tests: No results for input(s): "TSH", "T4TOTAL", "FREET4", "T3FREE", "THYROIDAB" in the last 72 hours. Anemia Panel: No results for input(s): "VITAMINB12", "FOLATE", "FERRITIN", "TIBC", "IRON", "RETICCTPCT" in the last 72 hours. Sepsis Labs: Recent Labs  Lab 02/19/24 0333 02/19/24 0507 02/19/24 0908 02/20/24 1029  LATICACIDVEN 2.2* 2.4* 2.8* 1.2    Recent Results (from the past 240 hours)  Resp panel by RT-PCR (RSV, Flu A&B, Covid) Anterior Nasal Swab     Status: None   Collection Time: 02/19/24  3:31 AM   Specimen: Anterior  Nasal Swab  Result Value Ref Range Status   SARS Coronavirus 2 by RT PCR NEGATIVE NEGATIVE Final   Influenza A by PCR NEGATIVE NEGATIVE Final   Influenza B by PCR NEGATIVE NEGATIVE Final    Comment: (NOTE) The Xpert Xpress SARS-CoV-2/FLU/RSV plus assay is intended as an aid in the diagnosis of influenza from Nasopharyngeal swab specimens and should not be used as a sole basis for treatment. Nasal washings and aspirates are unacceptable for Xpert Xpress SARS-CoV-2/FLU/RSV testing.  Fact Sheet for Patients: BloggerCourse.com  Fact Sheet for Healthcare Providers: SeriousBroker.it  This test is not yet approved or cleared by the [place]  FDA and has been authorized for detection and/or diagnosis of SARS-CoV-2 by FDA under an Emergency Use Authorization (EUA). This EUA will remain in effect (meaning this test can be used) for the duration of the COVID-19 declaration under Section 564(b)(1) of the Act, 21 U.S.C. section 360bbb-3(b)(1), unless the authorization is terminated or revoked.     Resp Syncytial Virus by PCR NEGATIVE NEGATIVE Final    Comment: (NOTE) Fact Sheet for Patients: BloggerCourse.com  Fact Sheet for Healthcare Providers: SeriousBroker.it  This test is not yet approved or cleared by the [place]  FDA and has been authorized for detection and/or diagnosis of SARS-CoV-2 by FDA under an Emergency Use Authorization (EUA). This EUA will remain in effect (meaning this test can be used) for the duration of the COVID-19 declaration under Section 564(b)(1) of the Act, 21 U.S.C. section 360bbb-3(b)(1), unless the authorization is terminated or revoked.  Performed at [facility], [address] [place], [place] [address]   Blood Culture (routine x 2)     Status: None (Preliminary result)   Collection Time: 02/19/24  9:43 PM   Specimen: BLOOD RIGHT HAND   Result Value Ref Range Status   Specimen Description BLOOD RIGHT HAND  Final   Special Requests   Final    BOTTLES DRAWN AEROBIC AND ANAEROBIC Blood Culture adequate volume   Culture   Final    NO GROWTH 2 DAYS Performed at [facility], [address] [place], [place] [address]    Report Status PENDING  Incomplete  Blood Culture (routine x 2)     Status: None (Preliminary result)   Collection Time: 02/19/24  9:44 PM   Specimen: BLOOD  Result Value Ref Range Status   Specimen Description BLOOD SITE NOT SPECIFIED  Final   Special Requests   Final    BOTTLES DRAWN AEROBIC AND ANAEROBIC Blood Culture adequate volume  Culture   Final    NO GROWTH 2 DAYS Performed at [facility], [address] [place], [place] [address]    Report Status PENDING  Incomplete         Radiology Studies: No results found.       Scheduled Meds:  atorvastatin   40 mg Oral QHS   cholecalciferol   1,000 Units Oral Daily   enoxaparin  (LOVENOX ) injection  40 mg Subcutaneous Q24H   fluticasone   2 spray Each Nare Daily   insulin  aspart  0-15 Units Subcutaneous TID WC   insulin  glargine-yfgn  10 Units Subcutaneous Daily   cyanocobalamin   100 mcg Oral Daily   Continuous Infusions:  cefTRIAXone  (ROCEPHIN )  IV 2 g (02/20/24 2110)     LOS: 2 days    Time spent: 50 minutes spent on 02/21/2024 caring for this patient face-to-face including chart review, ordering labs/tests, documenting, discussion with nursing staff, consultants, updating family and interview/physical exam    [facility] [place], DO [facility] Available via Epic secure chat 7am-7pm After these hours, please refer to coverage provider listed on [email or web address] 02/21/2024, 12:43 PM

## 2024-02-21 NOTE — Plan of Care (Signed)
   Problem: Coping: Goal: Ability to adjust to condition or change in health will improve Outcome: Progressing

## 2024-02-22 ENCOUNTER — Other Ambulatory Visit (HOSPITAL_COMMUNITY): Payer: Self-pay

## 2024-02-22 DIAGNOSIS — A419 Sepsis, unspecified organism: Secondary | ICD-10-CM | POA: Diagnosis not present

## 2024-02-22 DIAGNOSIS — N390 Urinary tract infection, site not specified: Secondary | ICD-10-CM | POA: Diagnosis not present

## 2024-02-22 LAB — GLUCOSE, CAPILLARY: Glucose-Capillary: 139 mg/dL — ABNORMAL HIGH (ref 70–99)

## 2024-02-22 MED ORDER — OXYCODONE HCL 5 MG PO TABS
5.0000 mg | ORAL_TABLET | Freq: Three times a day (TID) | ORAL | 0 refills | Status: DC | PRN
  Filled 2024-02-22: qty 15, 5d supply, fill #0

## 2024-02-22 MED ORDER — CEPHALEXIN 500 MG PO CAPS
500.0000 mg | ORAL_CAPSULE | Freq: Two times a day (BID) | ORAL | 0 refills | Status: AC
  Filled 2024-02-22: qty 12, 6d supply, fill #0

## 2024-02-22 NOTE — Discharge Summary (Signed)
 Physician Discharge Summary  [name]:[id number] DOB: 1971/01/08 DOA: 02/18/2024  PCP: Kelsey Epley, FNP  Admit date: 02/18/2024 Discharge date: 02/22/2024  Admitted From: Home Disposition: Home  Recommendations for Outpatient Follow-up:  Follow up with PCP in 1-2 weeks Continue antibiotics with Keflex  to complete course for UTI Needs close follow-up with PCP regarding poorly controlled type 2 diabetes mellitus with hemoglobin A1c 9 Follow-up sodium level on discharge, if hyponatremia persists recommend discontinuation of hydrochlorothiazide  Recommend outpatient pelvic ultrasound in 6-12 months for left ovarian cyst Please obtain BMP/CBC in one week  Home Health: No Equipment/Devices: None  Discharge Condition: Stable CODE STATUS: Full code Diet recommendation: Heart healthy/consistent carbohydrate diet  History of present illness:  Kelsey Olson is a 53 y.o. female with past medical history significant for HTN, DM2 who presented to [facility] ED on 02/18/2024 with complaints of bilateral flank pain, nausea/vomiting, body aches, fever/chills, dysuria.  Onset of symptoms 02/16/2024.  She was seen by telemedicine and prescribed nitrofurantoin which she took a few doses however her symptoms continue to progress.   In the ED, temperature 98.6 F, HR 105, RR 20, BP 134/56, SpO2 99% on room air.  WBC 26.0, hemoglobin 10.6, platelet count 380.  Sodium 127, potassium 4.0, chloride 94, CO2 22, glucose 374, BUN 15, creatinine 1.31.  AST 14, ALT 17, total bilirubin 0.6.  Lipase 35.  Lactic acid 2.2.  hCG negative.  COVID/influenza/RSV PCR negative.  Urinalysis with large hemoglobin, small leukocytes, positive nitrite, greater than 300 protein, many bacteria, greater than 50 WBCs.  Chest x-ray with no active cardiopulmonary disease process.  CT renal stone study with mild left greater than right hydroureteronephrosis to the level of collapsed urinary bladder with prominent cystitis, no calculus,  extensive right renal cortical scarring, cystic density left ovary measuring up to 5.1 cm.  Hospital course:  Sepsis secondary to urinary tract infection Patient presenting with fever, chills, dysuria, body aches associated with nausea and vomiting.  Patient was afebrile on ED arrival with tachycardia, elevated WBC count of 26.0 with lactic acid 2.2.  Urinalysis consistent with urinary tract infection.  Patient was started on IV ceftriaxone .  Unfortunately no urine culture was obtained prior to initiation of IV antibiotics.  Blood culture showed no growth during hospitalization and patient's WBC count improved to 8.6 at time of discharge.  Will continue Keflex  500 mg p.o. twice daily, 10-day course on discharge.  Outpatient follow-up with PCP.   Hyponatremia Sodium 127 on admission likely secondary to hypovolemic hyponatremia in the setting of poor oral intake in the days preceding hospitalization.  Also likely complicated by antihypertensive use with HCTZ.  Sodium improved to 134 at time of discharge.  Outpatient follow-up with PCP with recommended repeat BMP 1 week.  If persistent hyponatremia recommend discontinuing HCTZ.   Acute renal failure on CKD stage II Baseline creatinine 1.1.  Creatinine on admission elevated 1.31.  Repeat BMP 1 week.   Essential hypertension On lisinopril -HCTZ 20-12.5 mg p.o. daily at baseline.   Left ovarian cyst Incidental finding of 5.1 cystic density left ovary.  Recommend outpatient pelvic ultrasound in 6-12 months.   Type 2 diabetes mellitus, poorly controlled Hemoglobin A1c 9.0.  On semaglutide  outpatient.  Outpatient follow-up PCP.   Hyperlipidemia Atorvastatin  40 mg p.o. daily   Obesity, class II Body mass index is 34.95 kg/m.  Discharge Diagnoses:  Principal Problem:   Sepsis due to urinary tract infection (HCC) Active Problems:   Essential hypertension   Type 2 diabetes mellitus  with complication, without long-term current use of insulin   (HCC)   AKI (acute kidney injury) [facility])    Discharge Instructions  Discharge Instructions     Call MD for:  difficulty breathing, headache or visual disturbances   Complete by: As directed    Call MD for:  extreme fatigue   Complete by: As directed    Call MD for:  persistant dizziness or light-headedness   Complete by: As directed    Call MD for:  persistant nausea and vomiting   Complete by: As directed    Call MD for:  severe uncontrolled pain   Complete by: As directed    Call MD for:  temperature >100.4   Complete by: As directed    Diet - low sodium heart healthy   Complete by: As directed    Increase activity slowly   Complete by: As directed       Allergies as of 02/22/2024       Reactions   Percocet [oxycodone -acetaminophen ] Itching, Other (See Comments)   Hallucinations        Medication List     TAKE these medications    acetaminophen  500 MG tablet Commonly known as: TYLENOL  Take 1,000 mg by mouth every 6 (six) hours as needed for fever or mild pain (pain score 1-3).   atorvastatin  40 MG tablet Commonly known as: LIPITOR Take 1 tablet (40 mg total) by mouth at bedtime.   cephALEXin  500 MG capsule Commonly known as: KEFLEX  Take 1 capsule (500 mg total) by mouth 2 (two) times daily for 6 days.   cholecalciferol  25 MCG (1000 UNIT) tablet Commonly known as: VITAMIN D3 Take 1 tablet (1,000 Units total) by mouth daily.   cyanocobalamin  100 MCG tablet Take 100 mcg by mouth daily.   ferrous sulfate  325 (65 FE) MG EC tablet Take 1 tablet by mouth twice daily What changed: when to take this   fluticasone  50 MCG/ACT nasal spray Commonly known as: FLONASE  Place 2 sprays into both nostrils daily.   ibuprofen  800 MG tablet Commonly known as: ADVIL  Take 1 tablet (800 mg total) by mouth every 8 (eight) hours as needed.   lisinopril -hydrochlorothiazide  20-12.5 MG tablet Commonly known as: ZESTORETIC  Take 1 tablet by mouth once daily   oxyCODONE  5  MG immediate release tablet Commonly known as: Roxicodone  Take 1 tablet (5 mg total) by mouth every 8 (eight) hours as needed for moderate pain (pain score 4-6).   Ozempic  (1 MG/DOSE) 4 MG/3ML Sopn Generic drug: Semaglutide  (1 MG/DOSE) Inject 1 mg into the skin once a week.   triamcinolone  ointment 0.5 % Commonly known as: KENALOG  APPLY  OINTMENT TOPICALLY TWICE DAILY What changed: See the new instructions.        Follow-up Information     Kelsey Epley, FNP. Schedule an appointment as soon as possible for a visit in 1 week(s).   Specialty: General Practice Contact information: [address] [place] [address] [phone]                Allergies  Allergen Reactions   Percocet [Oxycodone -Acetaminophen ] Itching and Other (See Comments)    Hallucinations    Consultations: None   Procedures/Studies: CT RENAL STONE STUDY Result Date: 02/19/2024 CLINICAL DATA:  Abdominal/flank pain with stone suspected. UTI with sepsis. EXAM: CT ABDOMEN AND PELVIS WITHOUT CONTRAST TECHNIQUE: Multidetector CT imaging of the abdomen and pelvis was performed following the standard protocol without IV contrast. RADIATION DOSE REDUCTION: This exam was performed according  to the departmental dose-optimization program which includes automated exposure control, adjustment of the mA and/or kV according to patient size and/or use of iterative reconstruction technique. COMPARISON:  04/09/2013 abdominal CT FINDINGS: Lower chest:  No contributory findings. Hepatobiliary: No focal liver abnormality.No evidence of biliary obstruction or stone. Pancreas: Unremarkable. Spleen: Unremarkable. Adrenals/Urinary Tract: Negative adrenals. Left hydroureteronephrosis to the level of the urinary bladder. No calcified stone. Extensive right renal cortical scarring with lobulation and thinning. Mild right hydroureteronephrosis. Collapsed urinary bladder with extensive perivesicular fat stranding, there  is history of UTI. Stomach/Bowel:  No obstruction. No appendicitis. Vascular/Lymphatic: No acute vascular abnormality. No mass or adenopathy. Reproductive:5.1 cm cystic density at the left adnexa/ovary Other: No ascites or pneumoperitoneum. Musculoskeletal: No acute abnormalities. Lumbar spine degeneration, especially L4-5 facet osteoarthritis. IMPRESSION: Mild left more than right hydroureteronephrosis to the level of the collapsed urinary bladder which shows prominent cystitis findings. No calculus. Extensive right renal cortical scarring. Cystic density at the left ovary measuring up to 5.1 cm, recommend pelvic ultrasound in 6-12 weeks in this perimenopausal age patient. Electronically Signed   By: Ronnette Coke M.D.   On: 02/19/2024 05:22   DG Chest Port 1 View Result Date: 02/19/2024 CLINICAL DATA:  Possible sepsis EXAM: PORTABLE CHEST 1 VIEW COMPARISON:  12/27/2020 FINDINGS: The heart size and mediastinal contours are within normal limits. Both lungs are clear. The visualized skeletal structures are unremarkable. IMPRESSION: No active disease. Electronically Signed   By: Violeta Grey M.D.   On: 02/19/2024 03:48     Subjective: Patient seen examined bedside, lying in bed.  Husband present.  Afebrile past 24 hours.  Feels improve other than occasional continued dysuria with urination.  White blood cell count is now normalized.  Discharging home on oral antibiotics.  Recommend outpatient follow-up with PCP 1 week.  Denies headache, no dizziness, no chest pain, no palpitations, no shortness of breath, no abdominal pain, no fever/chills/night sweats, no nausea/vomiting/diarrhea, no focal weakness, no fatigue, no paresthesia.  No acute events overnight per nursing staff.  Discharge Exam: Vitals:   02/22/24 0447 02/22/24 0805  BP: 124/67 127/63  Pulse: (!) 101 (!) 102  Resp: 18 17  Temp: 99.1 F (37.3 C) 98.8 F (37.1 C)  SpO2: 98% 96%   Vitals:   02/21/24 1659 02/21/24 2032 02/22/24 0447  02/22/24 0805  BP: 120/65 (!) 128/58 124/67 127/63  Pulse: 95 (!) 106 (!) 101 (!) 102  Resp: 17 18 18 17   Temp: 98.7 F (37.1 C) 99.4 F (37.4 C) 99.1 F (37.3 C) 98.8 F (37.1 C)  TempSrc:  Oral Oral Oral  SpO2: 96% 99% 98% 96%  Weight:      Height:        Physical Exam: GEN: NAD, alert and oriented x 3, obese HEENT: NCAT, PERRL, EOMI, sclera clear, MMM PULM: CTAB w/o wheezes/crackles, normal respiratory effort, room air CV: RRR w/o M/G/R GI: abd soft, NTND, + BS MSK: no peripheral edema, muscle strength globally intact 5/5 bilateral upper/lower extremities NEURO: CN II-XII intact, no focal deficits, sensation to light touch intact PSYCH: normal mood/affect Integumentary: dry/intact, no rashes or wounds    The results of significant diagnostics from this hospitalization (including imaging, microbiology, ancillary and laboratory) are listed below for reference.     Microbiology: Recent Results (from the past 240 hours)  Resp panel by RT-PCR (RSV, Flu A&B, Covid) Anterior Nasal Swab     Status: None   Collection Time: 02/19/24  3:31 AM   Specimen: Anterior  Nasal Swab  Result Value Ref Range Status   SARS Coronavirus 2 by RT PCR NEGATIVE NEGATIVE Final   Influenza A by PCR NEGATIVE NEGATIVE Final   Influenza B by PCR NEGATIVE NEGATIVE Final    Comment: (NOTE) The Xpert Xpress SARS-CoV-2/FLU/RSV plus assay is intended as an aid in the diagnosis of influenza from Nasopharyngeal swab specimens and should not be used as a sole basis for treatment. Nasal washings and aspirates are unacceptable for Xpert Xpress SARS-CoV-2/FLU/RSV testing.  Fact Sheet for Patients: BloggerCourse.com  Fact Sheet for Healthcare Providers: SeriousBroker.it  This test is not yet approved or cleared by the [place]  FDA and has been authorized for detection and/or diagnosis of SARS-CoV-2 by FDA under an Emergency Use Authorization (EUA).  This EUA will remain in effect (meaning this test can be used) for the duration of the COVID-19 declaration under Section 564(b)(1) of the Act, 21 U.S.C. section 360bbb-3(b)(1), unless the authorization is terminated or revoked.     Resp Syncytial Virus by PCR NEGATIVE NEGATIVE Final    Comment: (NOTE) Fact Sheet for Patients: BloggerCourse.com  Fact Sheet for Healthcare Providers: SeriousBroker.it  This test is not yet approved or cleared by the [place]  FDA and has been authorized for detection and/or diagnosis of SARS-CoV-2 by FDA under an Emergency Use Authorization (EUA). This EUA will remain in effect (meaning this test can be used) for the duration of the COVID-19 declaration under Section 564(b)(1) of the Act, 21 U.S.C. section 360bbb-3(b)(1), unless the authorization is terminated or revoked.  Performed at [facility], [address] [place], [place] [address]   Blood Culture (routine x 2)     Status: None (Preliminary result)   Collection Time: 02/19/24  9:43 PM   Specimen: BLOOD RIGHT HAND  Result Value Ref Range Status   Specimen Description BLOOD RIGHT HAND  Final   Special Requests   Final    BOTTLES DRAWN AEROBIC AND ANAEROBIC Blood Culture adequate volume   Culture   Final    NO GROWTH 3 DAYS Performed at [facility], [address], [place], [place] [address]    Report Status PENDING  Incomplete  Blood Culture (routine x 2)     Status: None (Preliminary result)   Collection Time: 02/19/24  9:44 PM   Specimen: BLOOD  Result Value Ref Range Status   Specimen Description BLOOD SITE NOT SPECIFIED  Final   Special Requests   Final    BOTTLES DRAWN AEROBIC AND ANAEROBIC Blood Culture adequate volume   Culture   Final    NO GROWTH 3 DAYS Performed at [facility], [address] [place], [place] [address]    Report Status PENDING  Incomplete     Labs: BNP (last 3 results) No  results for input(s): "BNP" in the last 8760 hours. Basic Metabolic Panel: Recent Labs  Lab 02/18/24 1913 02/19/24 0329 02/19/24 0908 02/19/24 2142 02/20/24 1029 02/21/24 0639  NA 127* 128*  --  134* 133* 134*  K 4.0 3.8  --  3.6 3.8 3.8  CL 94* 92*  --  101 98 102  CO2 22 21*  --  23 23 21*  GLUCOSE 374* 366*  --  158* 157* 152*  BUN 15 19  --  17 19 14   CREATININE 1.31* 1.76* 1.87* 1.55* 1.60* 1.40*  CALCIUM  9.3 9.7  --  8.7* 8.8* 8.2*  MG  --   --   --   --   --  1.7   Liver Function Tests: Recent Labs  Lab 02/18/24 1913 02/19/24 0329  AST 14* 15  ALT 17 18  ALKPHOS 72 76  BILITOT 0.6 0.9  PROT 7.4 8.1  ALBUMIN 3.6 4.0   Recent Labs  Lab 02/18/24 1913  LIPASE 35   No results for input(s): "AMMONIA" in the last 168 hours. CBC: Recent Labs  Lab 02/18/24 1913 02/19/24 0329 02/19/24 0908 02/20/24 1029 02/21/24 0639  WBC 16.9* 26.0* 14.5* 15.0* 8.6  NEUTROABS  --  23.1*  --   --   --   HGB 12.6 10.6* 11.2* 12.0 9.9*  HCT 38.8 32.0* 33.7* 36.2 29.8*  MCV 86.8 87.0 86.0 86.2 86.4  PLT 404* 380 338 357 307   Cardiac Enzymes: No results for input(s): "CKTOTAL", "CKMB", "CKMBINDEX", "TROPONINI" in the last 168 hours. BNP: Invalid input(s): "POCBNP" CBG: Recent Labs  Lab 02/21/24 0801 02/21/24 1203 02/21/24 1655 02/21/24 2028 02/22/24 0807  GLUCAP 145* 171* 135* 181* 139*   D-Dimer No results for input(s): "DDIMER" in the last 72 hours. Hgb A1c No results for input(s): "HGBA1C" in the last 72 hours. Lipid Profile No results for input(s): "CHOL", "HDL", "LDLCALC", "TRIG", "CHOLHDL", "LDLDIRECT" in the last 72 hours. Thyroid function studies No results for input(s): "TSH", "T4TOTAL", "T3FREE", "THYROIDAB" in the last 72 hours.  Invalid input(s): "FREET3" Anemia work up No results for input(s): "VITAMINB12", "FOLATE", "FERRITIN", "TIBC", "IRON", "RETICCTPCT" in the last 72 hours. Urinalysis    Component Value Date/Time   COLORURINE AMBER (A)  02/18/2024 1951   APPEARANCEUR HAZY (A) 02/18/2024 1951   LABSPEC 1.020 02/18/2024 1951   PHURINE 6.0 02/18/2024 1951   GLUCOSEU >=500 (A) 02/18/2024 1951   HGBUR LARGE (A) 02/18/2024 1951   BILIRUBINUR NEGATIVE 02/18/2024 1951   BILIRUBINUR negative 01/16/2024 1055   BILIRUBINUR negative 01/10/2022 1112   KETONESUR 15 (A) 02/18/2024 1951   PROTEINUR >300 (A) 02/18/2024 1951   UROBILINOGEN 0.2 01/16/2024 1055   UROBILINOGEN 0.2 10/23/2014 2340   NITRITE POSITIVE (A) 02/18/2024 1951   LEUKOCYTESUR SMALL (A) 02/18/2024 1951   Sepsis Labs Recent Labs  Lab 02/19/24 0329 02/19/24 0908 02/20/24 1029 02/21/24 0639  WBC 26.0* 14.5* 15.0* 8.6   Microbiology Recent Results (from the past 240 hours)  Resp panel by RT-PCR (RSV, Flu A&B, Covid) Anterior Nasal Swab     Status: None   Collection Time: 02/19/24  3:31 AM   Specimen: Anterior Nasal Swab  Result Value Ref Range Status   SARS Coronavirus 2 by RT PCR NEGATIVE NEGATIVE Final   Influenza A by PCR NEGATIVE NEGATIVE Final   Influenza B by PCR NEGATIVE NEGATIVE Final    Comment: (NOTE) The Xpert Xpress SARS-CoV-2/FLU/RSV plus assay is intended as an aid in the diagnosis of influenza from Nasopharyngeal swab specimens and should not be used as a sole basis for treatment. Nasal washings and aspirates are unacceptable for Xpert Xpress SARS-CoV-2/FLU/RSV testing.  Fact Sheet for Patients: BloggerCourse.com  Fact Sheet for Healthcare Providers: SeriousBroker.it  This test is not yet approved or cleared by the [place]  FDA and has been authorized for detection and/or diagnosis of SARS-CoV-2 by FDA under an Emergency Use Authorization (EUA). This EUA will remain in effect (meaning this test can be used) for the duration of the COVID-19 declaration under Section 564(b)(1) of the Act, 21 U.S.C. section 360bbb-3(b)(1), unless the authorization is terminated or revoked.      Resp Syncytial Virus by PCR NEGATIVE NEGATIVE Final    Comment: (NOTE)  Fact Sheet for Patients: BloggerCourse.com  Fact Sheet for Healthcare Providers: SeriousBroker.it  This test is not yet approved or cleared by the [place]  FDA and has been authorized for detection and/or diagnosis of SARS-CoV-2 by FDA under an Emergency Use Authorization (EUA). This EUA will remain in effect (meaning this test can be used) for the duration of the COVID-19 declaration under Section 564(b)(1) of the Act, 21 U.S.C. section 360bbb-3(b)(1), unless the authorization is terminated or revoked.  Performed at [facility], [address], [place], [place] [address]   Blood Culture (routine x 2)     Status: None (Preliminary result)   Collection Time: 02/19/24  9:43 PM   Specimen: BLOOD RIGHT HAND  Result Value Ref Range Status   Specimen Description BLOOD RIGHT HAND  Final   Special Requests   Final    BOTTLES DRAWN AEROBIC AND ANAEROBIC Blood Culture adequate volume   Culture   Final    NO GROWTH 3 DAYS Performed at [facility], [address], [place], [place] [address]    Report Status PENDING  Incomplete  Blood Culture (routine x 2)     Status: None (Preliminary result)   Collection Time: 02/19/24  9:44 PM   Specimen: BLOOD  Result Value Ref Range Status   Specimen Description BLOOD SITE NOT SPECIFIED  Final   Special Requests   Final    BOTTLES DRAWN AEROBIC AND ANAEROBIC Blood Culture adequate volume   Culture   Final    NO GROWTH 3 DAYS Performed at [facility], [address] [place], [place] [address]    Report Status PENDING  Incomplete     Time coordinating discharge: Over 30 minutes  SIGNED:   Rema Care [place], DO  [facility] 02/22/2024, 9:09 AM

## 2024-02-22 NOTE — Plan of Care (Signed)

## 2024-02-23 ENCOUNTER — Emergency Department (HOSPITAL_COMMUNITY)
Admission: EM | Admit: 2024-02-23 | Discharge: 2024-02-24 | Disposition: A | Discharge status: Home / Self Care | Attending: Emergency Medicine | Admitting: Emergency Medicine

## 2024-02-23 DIAGNOSIS — N3000 Acute cystitis without hematuria: Secondary | ICD-10-CM

## 2024-02-23 DIAGNOSIS — R1012 Left upper quadrant pain: Secondary | ICD-10-CM | POA: Diagnosis not present

## 2024-02-23 DIAGNOSIS — E860 Dehydration: Secondary | ICD-10-CM | POA: Diagnosis not present

## 2024-02-23 DIAGNOSIS — D649 Anemia, unspecified: Secondary | ICD-10-CM | POA: Insufficient documentation

## 2024-02-23 DIAGNOSIS — E878 Other disorders of electrolyte and fluid balance, not elsewhere classified: Secondary | ICD-10-CM | POA: Diagnosis not present

## 2024-02-23 DIAGNOSIS — I499 Cardiac arrhythmia, unspecified: Secondary | ICD-10-CM | POA: Diagnosis not present

## 2024-02-23 DIAGNOSIS — N1330 Unspecified hydronephrosis: Secondary | ICD-10-CM | POA: Diagnosis not present

## 2024-02-23 DIAGNOSIS — I10 Essential (primary) hypertension: Secondary | ICD-10-CM | POA: Insufficient documentation

## 2024-02-23 DIAGNOSIS — E876 Hypokalemia: Secondary | ICD-10-CM | POA: Insufficient documentation

## 2024-02-23 DIAGNOSIS — R1032 Left lower quadrant pain: Secondary | ICD-10-CM | POA: Diagnosis not present

## 2024-02-23 DIAGNOSIS — E871 Hypo-osmolality and hyponatremia: Secondary | ICD-10-CM | POA: Diagnosis not present

## 2024-02-23 DIAGNOSIS — E119 Type 2 diabetes mellitus without complications: Secondary | ICD-10-CM | POA: Diagnosis not present

## 2024-02-23 DIAGNOSIS — Z79899 Other long term (current) drug therapy: Secondary | ICD-10-CM | POA: Insufficient documentation

## 2024-02-23 DIAGNOSIS — N3289 Other specified disorders of bladder: Secondary | ICD-10-CM | POA: Diagnosis not present

## 2024-02-23 DIAGNOSIS — R109 Unspecified abdominal pain: Secondary | ICD-10-CM | POA: Diagnosis not present

## 2024-02-23 DIAGNOSIS — R935 Abnormal findings on diagnostic imaging of other abdominal regions, including retroperitoneum: Secondary | ICD-10-CM | POA: Diagnosis not present

## 2024-02-23 DIAGNOSIS — K5900 Constipation, unspecified: Secondary | ICD-10-CM | POA: Diagnosis not present

## 2024-02-23 NOTE — ED Triage Notes (Addendum)
 Pt arrives via [facility] from home. LLQ abdominal pain, rebound tenderness. NO BM in 5 days. D/c from [facility] with UTI, has been taking oxycodone  and keflex . Pain has increased, now radiating to the back- last oxycodone  7pm "Pressure" when she tries to urinate. En route, 160/66, hr 97, 98% ra, cbg 200, 97.7. No vomiting.

## 2024-02-24 ENCOUNTER — Telehealth: Payer: Self-pay

## 2024-02-24 ENCOUNTER — Encounter (HOSPITAL_COMMUNITY): Payer: Self-pay | Admitting: *Deleted

## 2024-02-24 ENCOUNTER — Other Ambulatory Visit: Payer: Self-pay

## 2024-02-24 ENCOUNTER — Emergency Department (HOSPITAL_COMMUNITY)

## 2024-02-24 DIAGNOSIS — R935 Abnormal findings on diagnostic imaging of other abdominal regions, including retroperitoneum: Secondary | ICD-10-CM | POA: Diagnosis not present

## 2024-02-24 DIAGNOSIS — N1330 Unspecified hydronephrosis: Secondary | ICD-10-CM | POA: Diagnosis not present

## 2024-02-24 DIAGNOSIS — R1032 Left lower quadrant pain: Secondary | ICD-10-CM | POA: Diagnosis not present

## 2024-02-24 DIAGNOSIS — N3289 Other specified disorders of bladder: Secondary | ICD-10-CM | POA: Diagnosis not present

## 2024-02-24 LAB — URINALYSIS, ROUTINE W REFLEX MICROSCOPIC
Bilirubin Urine: NEGATIVE
Glucose, UA: 150 mg/dL — AB
Ketones, ur: 5 mg/dL — AB
Nitrite: NEGATIVE
Protein, ur: >=300 mg/dL — AB
Specific Gravity, Urine: 1.01 (ref 1.005–1.030)
WBC, UA: >50 WBC/hpf (ref 0–5)
pH: 7 (ref 5.0–8.0)

## 2024-02-24 LAB — COMPREHENSIVE METABOLIC PANEL WITH GFR
ALT: 41 U/L (ref 0–44)
AST: 26 U/L (ref 15–41)
Albumin: 2.8 g/dL — ABNORMAL LOW (ref 3.5–5.0)
Alkaline Phosphatase: 127 U/L — ABNORMAL HIGH (ref 38–126)
Anion gap: 14 (ref 5–15)
BUN: 8 mg/dL (ref 6–20)
CO2: 24 mmol/L (ref 22–32)
Calcium: 9.1 mg/dL (ref 8.9–10.3)
Chloride: 96 mmol/L — ABNORMAL LOW (ref 98–111)
Creatinine, Ser: 1.1 mg/dL — ABNORMAL HIGH (ref 0.44–1.00)
GFR, Estimated: >60 mL/min (ref >60)
Glucose, Bld: 181 mg/dL — ABNORMAL HIGH (ref 70–99)
Potassium: 3.4 mmol/L — ABNORMAL LOW (ref 3.5–5.1)
Sodium: 134 mmol/L — ABNORMAL LOW (ref 135–145)
Total Bilirubin: 1 mg/dL (ref 0.0–1.2)
Total Protein: 6.9 g/dL (ref 6.5–8.1)

## 2024-02-24 LAB — CULTURE, BLOOD (ROUTINE X 2)
Culture: NO GROWTH
Culture: NO GROWTH
Special Requests: ADEQUATE
Special Requests: ADEQUATE

## 2024-02-24 LAB — CBC
HCT: 30.9 % — ABNORMAL LOW (ref 36.0–46.0)
Hemoglobin: 10 g/dL — ABNORMAL LOW (ref 12.0–15.0)
MCH: 28.2 pg (ref 26.0–34.0)
MCHC: 32.4 g/dL (ref 30.0–36.0)
MCV: 87.3 fL (ref 80.0–100.0)
Platelets: 468 10*3/uL — ABNORMAL HIGH (ref 150–400)
RBC: 3.54 MIL/uL — ABNORMAL LOW (ref 3.87–5.11)
RDW: 13.4 % (ref 11.5–15.5)
WBC: 8.6 10*3/uL (ref 4.0–10.5)
nRBC: 0 % (ref 0.0–0.2)

## 2024-02-24 LAB — HCG, SERUM, QUALITATIVE: Preg, Serum: NEGATIVE

## 2024-02-24 LAB — LIPASE, BLOOD: Lipase: 28 U/L (ref 11–51)

## 2024-02-24 MED ORDER — FENTANYL CITRATE PF 50 MCG/ML IJ SOSY
25.0000 ug | PREFILLED_SYRINGE | Freq: Once | INTRAMUSCULAR | Status: AC
  Administered 2024-02-24: 25 ug via INTRAVENOUS
  Filled 2024-02-24: qty 1

## 2024-02-24 MED ORDER — SODIUM CHLORIDE 0.9 % IV BOLUS
1000.0000 mL | Freq: Once | INTRAVENOUS | Status: AC
Start: 2024-02-24 — End: 2024-02-24
  Administered 2024-02-24: 1000 mL via INTRAVENOUS

## 2024-02-24 MED ORDER — IOHEXOL 300 MG/ML  SOLN
100.0000 mL | Freq: Once | INTRAMUSCULAR | Status: AC | PRN
  Administered 2024-02-24: 100 mL via INTRAVENOUS

## 2024-02-24 MED ORDER — POLYETHYLENE GLYCOL 3350 17 G PO PACK: 17.0000 g | PACK | Freq: Every day | ORAL | 0 refills | Status: AC

## 2024-02-24 MED ORDER — MAGNESIUM CITRATE PO SOLN: 1.0000 | Freq: Once | ORAL | 0 refills | Status: AC

## 2024-02-24 NOTE — ED Provider Notes (Signed)
 Accepted handoff at shift change from Ariel Zelaya, PA-C. Please see prior provider note for more detail.   Briefly: Patient is 53 y.o. presenting for abdominal pain.  Recent admission for urosepsis.  DDX: concern for bowel obstruction, urolithiasis, pyelonephritis, pancreatitis, appendicitis   Plan: CT pending, reassess    Physical Exam  BP (!) 151/68 (BP Location: Left Arm)   Pulse 96   Temp 98.6 F (37 C) (Oral)   Resp 16   SpO2 98%   Physical Exam  Procedures  Procedures  ED Course / MDM   Clinical Course as of 02/24/24 0857  Mon Feb 24, 2024  0630 Recent admission for urosepsis. D/c hom 2 days ago. Hasn't had a BM in 4 to 5 days. On abx for UTI already. CT for obstruction. Doing prune juice and miralax .  [JR]  0834 Glucose(!): 181 [JR]    Clinical Course User Index [JR] Janalee Mcmurray, PA-C   Medical Decision Making Amount and/or Complexity of Data Reviewed Labs: ordered. Decision-making details documented in ED Course. Radiology: ordered.  Risk Prescription drug management.    CT:  1. Marked ureteral wall thickening in the distal left ureter with  associated circumferential irregular bladder wall thickening. There  is associated mild to moderate left hydroureteronephrosis with  prominent left perinephric and periureteric edema. Extraperitoneal  edema/fluid is identified in the left pelvic sidewall and in the  presacral space, presumably arising from the above described changes  in the left kidney and ureter. Findings are compatible with  infectious/inflammatory etiology with involvement of the kidney and  ureter. Although neoplasm is considered less likely, close follow-up  recommended.  2. Marked cortical scarring right kidney with mild right-sided  hydroureteronephrosis. Despite the more advanced scarring in the  right kidney, there is no substantial perinephric or periureteric  edema on the current study.  3. 5 mm left lower lobe pulmonary nodule,  more conspicuous than on  the prior study.    CT findings concerning for ongoing UTI.  On reassessment patient stated that she no longer had abdominal pain and feels well and is ready to leave.  Given urinalysis and CT findings, ultimately suggesting that UTI is still ongoing.  Sent for urine culture.  Advised to continue on Keflex .  Discussed strict return precautions.  Sent bowel regimen to her pharmacy.  Also advised to follow-up PCP.  Discharged in good condition.      Janalee Mcmurray, PA-C 02/24/24 1610    Burnette Carte, MD 02/24/24 [phone]

## 2024-02-24 NOTE — ED Provider Notes (Addendum)
 [place] EMERGENCY DEPARTMENT AT [facility] Provider Note   CSN: [id number] Arrival date & time: 02/23/24  2349     History Chief Complaint  Patient presents with   Abdominal Pain    Kelsey Olson is a 53 y.o. female.  Patient with past history significant for recent hospitalization for urosepsis, and past history significant for type 2 diabetes, hypertension, obesity presents to the emergency department today with concerns of abdominal pain.  States that she has been having left lower quad abdominal pain ongoing for the last 5 days with no bowel movements.  States that she was discharged from [facility] with a UTI has been taking antibiotics as well as pain medicine since then.  States that while she was hospitalized, she had minimal p.o. intake.  Has tried taking prune juice as well as rectal suppository without improvement. Currently taking Keflex  500mg  BID for urinary infection.   Abdominal Pain      Home Medications Prior to Admission medications   Medication Sig Start Date End Date Taking? Authorizing Provider  acetaminophen  (TYLENOL ) 500 MG tablet Take 1,000 mg by mouth every 6 (six) hours as needed for fever or mild pain (pain score 1-3).    [provider]  atorvastatin  (LIPITOR) 40 MG tablet Take 1 tablet (40 mg total) by mouth at bedtime. 09/09/23   Susanna Epley, FNP  cephALEXin  (KEFLEX ) 500 MG capsule Take 1 capsule (500 mg total) by mouth 2 (two) times daily for 6 days. 02/22/24 02/28/24  [place], Rema Care, DO  cholecalciferol  (VITAMIN D3) 25 MCG (1000 UNIT) tablet Take 1 tablet (1,000 Units total) by mouth daily. 09/11/21   Susanna Epley, FNP  cyanocobalamin  100 MCG tablet Take 100 mcg by mouth daily.    [provider]  ferrous sulfate  325 (65 FE) MG EC tablet Take 1 tablet by mouth twice daily Patient taking differently: Take 1 tablet by mouth daily with breakfast. 11/11/23   Susanna Epley, FNP  fluticasone  (FLONASE ) 50 MCG/ACT  nasal spray Place 2 sprays into both nostrils daily. 05/21/23   Susanna Epley, FNP  ibuprofen  (ADVIL ) 800 MG tablet Take 1 tablet (800 mg total) by mouth every 8 (eight) hours as needed. 12/27/22   Susanna Epley, FNP  lisinopril -hydrochlorothiazide  (ZESTORETIC ) 20-12.5 MG tablet Take 1 tablet by mouth once daily 02/06/24   Moore, Janece, FNP  oxyCODONE  (ROXICODONE ) 5 MG immediate release tablet Take 1 tablet (5 mg total) by mouth every 8 (eight) hours as needed for moderate pain (pain score 4-6). 02/22/24 02/21/25  [place], Eric J, DO  Semaglutide , 1 MG/DOSE, 4 MG/3ML SOPN Inject 1 mg into the skin once a week. 01/20/24   Susanna Epley, FNP  triamcinolone  ointment (KENALOG ) 0.5 % APPLY  OINTMENT TOPICALLY TWICE DAILY Patient taking differently: Apply 1 Application topically 2 (two) times daily as needed (psoriasis). 09/20/23   Susanna Epley, FNP      Allergies    Percocet [oxycodone -acetaminophen ]    Review of Systems   Review of Systems  Gastrointestinal:  Positive for abdominal pain.  All other systems reviewed and are negative.   Physical Exam Updated Vital Signs BP 134/78   Pulse (!) 104   Temp 98.5 F (36.9 C)   Resp 19   SpO2 100%  Physical Exam Vitals and nursing note reviewed.  Constitutional:      General: She is not in acute distress.    Appearance: She is well-developed.  HENT:     Head: Normocephalic and atraumatic.  Eyes:     Conjunctiva/sclera: Conjunctivae normal.  Cardiovascular:     Rate and Rhythm: Normal rate and regular rhythm.     Heart sounds: No murmur heard. Pulmonary:     Effort: Pulmonary effort is normal. No respiratory distress.     Breath sounds: Normal breath sounds.  Abdominal:     General: Bowel sounds are decreased.     Palpations: Abdomen is soft.     Tenderness: There is abdominal tenderness in the left upper quadrant and left lower quadrant. There is no right CVA tenderness, left CVA tenderness, guarding or rebound.  Musculoskeletal:         General: No swelling.     Cervical back: Neck supple.  Skin:    General: Skin is warm and dry.     Capillary Refill: Capillary refill takes less than 2 seconds.  Neurological:     Mental Status: She is alert.  Psychiatric:        Mood and Affect: Mood normal.     ED Results / Procedures / Treatments   Labs (all labs ordered are listed, but only abnormal results are displayed) Labs Reviewed  COMPREHENSIVE METABOLIC PANEL WITH GFR - Abnormal; Notable for the following components:      Result Value   Sodium 134 (*)    Potassium 3.4 (*)    Chloride 96 (*)    Glucose, Bld 181 (*)    Creatinine, Ser 1.10 (*)    Albumin 2.8 (*)    Alkaline Phosphatase 127 (*)    All other components within normal limits  CBC - Abnormal; Notable for the following components:   RBC 3.54 (*)    Hemoglobin 10.0 (*)    HCT 30.9 (*)    Platelets 468 (*)    All other components within normal limits  URINALYSIS, ROUTINE W REFLEX MICROSCOPIC - Abnormal; Notable for the following components:   APPearance CLOUDY (*)    Glucose, UA 150 (*)    Hgb urine dipstick SMALL (*)    Ketones, ur 5 (*)    Protein, ur >=300 (*)    Leukocytes,Ua LARGE (*)    Bacteria, UA FEW (*)    All other components within normal limits  LIPASE, BLOOD  HCG, SERUM, QUALITATIVE    EKG None  Radiology No results found.  Procedures Procedures    Medications Ordered in ED Medications  sodium chloride  0.9 % bolus 1,000 mL (1,000 mLs Intravenous New Bag/Given 02/24/24 0622)  fentaNYL  (SUBLIMAZE ) injection 25 mcg (25 mcg Intravenous Given 02/24/24 0622)  iohexol (OMNIPAQUE) 300 MG/ML solution 100 mL (100 mLs Intravenous Contrast Given 02/24/24 1610)    ED Course/ Medical Decision Making/ A&P Clinical Course as of 02/24/24 0658  Mon Feb 24, 2024  0630 Recent admission for urosepsis. D/c hom 2 days ago. Hasn't had a BM in 4 to 5 days. On abx for UTI already. CT for obstruction. Doing prune juice and miralax .  [JR]     Clinical Course User Index [JR] Robinson, John K, PA-C                                 Medical Decision Making Amount and/or Complexity of Data Reviewed Labs: ordered. Radiology: ordered.  Risk Prescription drug management.   This patient presents to the ED for concern of abdominal pain.  Differential diagnosis includes bowel obstruction, urolithiasis, pyelonephritis, pancreatitis, appendicitis   Lab Tests:  I Ordered, and  personally interpreted labs.  The pertinent results include: CBC with baseline anemia, CMP with mild dehydration with hyponatremia, hypokalemia, hypochloremia.  No obvious AKI.  Urinalysis with evidence of infection likely still present with leukocytes and bacteria seen but nitrates not present.  Urine appears to be improved compared to most recent.  hCG negative.   Imaging Studies ordered:  I ordered imaging studies including CT abdomen pelvis I independently visualized and interpreted imaging which showed pending at time of sign out. I agree with the radiologist interpretation   Medicines ordered and prescription drug management:  I ordered medication including fluids, fentanyl  for dehydration, pain Reevaluation of the patient after these medicines showed that the patient improved I have reviewed the patients home medicines and have made adjustments as needed   Problem List / ED Course:  Patient with recent hospitalization for urosepsis presents to the emergency department with concerns of abdominal pain.  Reports that since she was hospitalized, she has mild bowel movement in the last 4 to 5 days.  She states that she can still passing gas but has to strain for this.  She is currently taking oxycodone  that she was discharged home with for pain from head urinary infection.  Denies any recent fever, chills, or bodyaches. On exam, patient has tenderness in the upper primarily towards the left side with no obvious CVA tenderness.  Bowel sounds are slightly  decreased. No abnormal heart of lung findings. At this time, differential includes constipation vs bowel obstruction vs diverticulitis vs pyelonephritis vs other.  Basic labs at this time show anemia but no obvious signs of infection with white count normal at 8.6.  CMP with mild dehydration but no evident AKI.  Urinalysis concerning for her infection still present but improved compared to prior.  hCG negative.  Lipase unremarkable.  CT abdomen pelvis pending at this time.  Fluids and fentanyl  for symptom control.  6:58 AM Care of Chantee Mummey transferred to [facility] and Dr. Reba Camper at the end of my shift as the patient will require reassessment once labs/imaging have resulted. Patient presentation, ED course, and plan of care discussed with review of all pertinent labs and imaging. Please see his/her note for further details regarding further ED course and disposition. Plan at time of handoff is disposition per CTAP results and patient response to interventions in the ED. Patient would likely benefit from stronger bowel regimen as she appears to have worsening constipation from poor diet and opiate use. This may be altered or completely changed at the discretion of the oncoming team pending results of further workup.   Final Clinical Impression(s) / ED Diagnoses Final diagnoses:  None    Rx / DC Orders ED Discharge Orders     None         Concetta Dee, PA-C 02/24/24 0655    Concetta Dee, PA-C 02/24/24 1610    Iva Mariner, MD 02/24/24 [phone]

## 2024-02-24 NOTE — ED Notes (Signed)
 Pt ambulatory from the bathroom, pt states that she has no pain at this time, states that she has some pressure from wanting to have a bm, pt states that she is ready to go home, sig other at bedside, pt verbalized understanding d/c and follow up, scripts reviewed, pt from department.

## 2024-02-24 NOTE — Discharge Instructions (Signed)
 Patient today revealed that you have an ongoing UTI.  Please continue the Keflex .  If you develop worsening abdominal pain or flank pain, developing fever and chills, nausea vomiting diarrhea or any other concerning symptom please return to the ED for further evaluation.  I also sent MiraLAX  and magnesium citrate to help with your constipation at home.  Please also follow-up with your PCP.

## 2024-02-24 NOTE — ED Triage Notes (Signed)
 Pt endorses left lower abdominal pain, pain is worse from time to time. She is still having urinary pain, but this is different. She is passing gas, but no BM since Tuesday, has been using suppositories and drinking prune juice without relief.

## 2024-02-24 NOTE — Transitions of Care (Post Inpatient/ED Visit) (Signed)
   02/24/2024  Name: KYLE LAACK MRN: [id number] DOB: 1971-08-27  Today's TOC FU Call Status: Today's TOC FU Call Status:: Successful TOC FU Call Completed TOC FU Call Complete Date: 02/24/24 Patient's Name and Date of Birth confirmed.  Transition Care Management Follow-up Telephone Call Date of Discharge: 02/22/24 Discharge Facility: [facility]) Type of Discharge: Inpatient Admission Primary Inpatient Discharge Diagnosis:: SEPSIS, AND STOMACH PAIN How have you been since you were released from the hospital?: Better Any questions or concerns?: No  Items Reviewed: Did you receive and understand the discharge instructions provided?: Yes Medications obtained,verified, and reconciled?: Yes (Medications Reviewed) Any new allergies since your discharge?: No Dietary orders reviewed?: No Do you have support at home?: Yes People in Home [RPT]: spouse  Medications Reviewed Today: Medications Reviewed Today   Medications were not reviewed in this encounter     Home Care and Equipment/Supplies: Were Home Health Services Ordered?: No Any new equipment or medical supplies ordered?: No  Functional Questionnaire: Do you need assistance with bathing/showering or dressing?: No Do you need assistance with meal preparation?: No Do you need assistance with eating?: No Do you have difficulty maintaining continence: No Do you need assistance with getting out of bed/getting out of a chair/moving?: No Do you have difficulty managing or taking your medications?: No  Follow up appointments reviewed: PCP Follow-up appointment confirmed?: Yes Date of PCP follow-up appointment?: 03/04/24 Follow-up Provider: [facility] Follow-up appointment confirmed?: NA Do you need transportation to your follow-up appointment?: No Do you understand care options if your condition(s) worsen?: Yes-patient verbalized understanding    SIGNATURE Kassandra Pair, CMA

## 2024-02-25 ENCOUNTER — Ambulatory Visit: Payer: Self-pay | Admitting: Nurse Practitioner

## 2024-02-25 LAB — URINE CULTURE: Culture: NO GROWTH

## 2024-02-27 ENCOUNTER — Ambulatory Visit: Payer: Self-pay | Admitting: *Deleted

## 2024-02-27 ENCOUNTER — Other Ambulatory Visit: Payer: Self-pay

## 2024-02-27 ENCOUNTER — Emergency Department (HOSPITAL_COMMUNITY)
Admission: EM | Admit: 2024-02-27 | Discharge: 2024-02-27 | Disposition: A | Discharge status: Home / Self Care | Attending: Emergency Medicine | Admitting: Emergency Medicine

## 2024-02-27 DIAGNOSIS — E871 Hypo-osmolality and hyponatremia: Secondary | ICD-10-CM | POA: Insufficient documentation

## 2024-02-27 DIAGNOSIS — E876 Hypokalemia: Secondary | ICD-10-CM | POA: Insufficient documentation

## 2024-02-27 DIAGNOSIS — N3090 Cystitis, unspecified without hematuria: Secondary | ICD-10-CM | POA: Diagnosis not present

## 2024-02-27 DIAGNOSIS — R300 Dysuria: Secondary | ICD-10-CM | POA: Diagnosis present

## 2024-02-27 LAB — URINALYSIS, ROUTINE W REFLEX MICROSCOPIC
Bacteria, UA: NONE SEEN
Bilirubin Urine: NEGATIVE
Glucose, UA: >=500 mg/dL — AB
Ketones, ur: NEGATIVE mg/dL
Nitrite: NEGATIVE
Protein, ur: 100 mg/dL — AB
RBC / HPF: >50 RBC/hpf (ref 0–5)
Specific Gravity, Urine: 1.013 (ref 1.005–1.030)
WBC, UA: >50 WBC/hpf (ref 0–5)
pH: 6 (ref 5.0–8.0)

## 2024-02-27 LAB — CBC WITH DIFFERENTIAL/PLATELET
Abs Immature Granulocytes: 0.1 10*3/uL — ABNORMAL HIGH (ref 0.00–0.07)
Basophils Absolute: 0.1 10*3/uL (ref 0.0–0.1)
Basophils Relative: 1 %
Eosinophils Absolute: 0.2 10*3/uL (ref 0.0–0.5)
Eosinophils Relative: 2 %
HCT: 32.7 % — ABNORMAL LOW (ref 36.0–46.0)
Hemoglobin: 10.6 g/dL — ABNORMAL LOW (ref 12.0–15.0)
Immature Granulocytes: 1 %
Lymphocytes Relative: 25 %
Lymphs Abs: 2.3 10*3/uL (ref 0.7–4.0)
MCH: 28 pg (ref 26.0–34.0)
MCHC: 32.4 g/dL (ref 30.0–36.0)
MCV: 86.5 fL (ref 80.0–100.0)
Monocytes Absolute: 0.7 10*3/uL (ref 0.1–1.0)
Monocytes Relative: 8 %
Neutro Abs: 5.7 10*3/uL (ref 1.7–7.7)
Neutrophils Relative %: 63 %
Platelets: 663 10*3/uL — ABNORMAL HIGH (ref 150–400)
RBC: 3.78 MIL/uL — ABNORMAL LOW (ref 3.87–5.11)
RDW: 13.2 % (ref 11.5–15.5)
WBC: 9 10*3/uL (ref 4.0–10.5)
nRBC: 0 % (ref 0.0–0.2)

## 2024-02-27 LAB — BASIC METABOLIC PANEL WITH GFR
Anion gap: 9 (ref 5–15)
BUN: 9 mg/dL (ref 6–20)
CO2: 28 mmol/L (ref 22–32)
Calcium: 9.3 mg/dL (ref 8.9–10.3)
Chloride: 96 mmol/L — ABNORMAL LOW (ref 98–111)
Creatinine, Ser: 1.12 mg/dL — ABNORMAL HIGH (ref 0.44–1.00)
GFR, Estimated: 60 mL/min — ABNORMAL LOW (ref >60)
Glucose, Bld: 273 mg/dL — ABNORMAL HIGH (ref 70–99)
Potassium: 3.3 mmol/L — ABNORMAL LOW (ref 3.5–5.1)
Sodium: 133 mmol/L — ABNORMAL LOW (ref 135–145)

## 2024-02-27 MED ORDER — CEPHALEXIN 500 MG PO CAPS: 500.0000 mg | ORAL_CAPSULE | Freq: Two times a day (BID) | ORAL | 0 refills | Status: AC

## 2024-02-27 NOTE — Telephone Encounter (Addendum)
 Copied from CRM [id number]. Topic: Clinical - Red Word Triage >> Feb 27, 2024 10:39 AM Kelsey Olson T wrote: Red Word that prompted transfer to Nurse Triage: patient stated she is still having extreme pain right after she use the bathroom. Her bladder does not feel like its empty and she has the constant pressure and pain that is a 10. She says she has not slept since leaving the hospital Reason for Disposition  Patient sounds very sick or weak to the triager    I have referred to the ED at [facility].   That's where she was.  Answer Assessment - Initial Assessment Questions 1. SEVERITY: "How bad is the pain?"  (e.g., Scale 1-10; mild, moderate, or severe)   - MILD (1-3): complains slightly about urination hurting   - MODERATE (4-7): interferes with normal activities     - SEVERE (8-10): excruciating, unwilling or unable to urinate because of the pain      I'm hurting at night so bad.   I'm having to pee every hour and it's only a little bit.  After I finish I still feel like I need to pee and it hurts real bad.  It's still hurting.     2. FREQUENCY: "How many times have you had painful urination today?"      Every time I have one more antibiotic pill left.   CT scan done at [facility] ED because thought I was constipation due to abd pain I was having.   Mild constipation but not enough to cause abd pain.   I took a stool softener and I had a BM but I'm still having abd pain. The pain is in my bladder. 3. PATTERN: "Is pain present every time you urinate or just sometimes?"      Yes 4. ONSET: "When did the painful urination start?"      I was in the hospital last Tues-Sat with UTI and Sepsis.    I went back to [facility] ED ED on Sun. Because I was still hurting.   I was given an antibiotic.    5. FEVER: "Do you have a fever?" If Yes, ask: "What is your temperature, how was it measured, and when did it start?"     No fever.    6. PAST UTI: "Have you had a urine infection before?" If Yes, ask: "When was the last  time?" and "What happened that time?"      Yes see above.   I was in the hospital for UTI and Sepsis and then went back to the ED on Sun.    I'm bent over with pain due to the pain in my bladder. 7. CAUSE: "What do you think is causing the painful urination?"  (e.g., UTI, scratch, Herpes sore)     UTI with sepsis.     CT scan did not show kidney stones.   Constipation not enough to be causing this kind of pain.  I did a stool softener and I had a BM.    THey said there was infection in my right kidney when in the ED on Sunday.    The pain around my stomach area is gone.   This pain is all in my privette area and only after I pee and I still feel like I need to go.   Sometimes I pee a lot and other time I don't.   I'm going often and I can't control the urge.    It just comes. 8. OTHER  SYMPTOMS: "Do you have any other symptoms?" (e.g., blood in urine, flank pain, genital sores, urgency, vaginal discharge)     See above 9. PREGNANCY: "Is there any chance you are pregnant?" "When was your last menstrual period?"     Not asked  Protocols used: Urination Pain - Female-A-AH  Chief Complaint: Severe pain in bladder after urinating.  Frequency, urgency and trouble with controlling urine, incontinence.   Was in hospital last Tues through Sat for UTI and Sepsis.   I was still having severe pain so I went back to the ED on Sunday at [facility].   They put me on an antibiotic.   I'm on my last pill and I'm having so much pain in my bladder after I urinate.   It is doubling me over it hurts so bad.  They told me to follow up with my PCP.   [facility] is closed from today 5/15-until they open on 5/19, 2025 in their new location on [address], [address].    Not seeing pts during this time.   Can do virtual but this pt needs to go to the ED due to her continued symptoms).    I am calling pt back and letting her know I have given her information for the wrong clinic.   It's not [facility] that is moving.   I have referred her back to [facility] ED. Symptoms: Severe pain in  bladder after urinating.   Still feels like she needs to pee after she has finished.   Going every hour and only a little bit at the time though occasionally it will be more.   The severe pain occurs after she has finished urinating.  It doubles her over it hurts so bad in her bladder.  She said the abd pain has resolved but the bladder pain is still there. Frequency: every time she urinates Pertinent Negatives: Patient denies abd pain at this point.  Disposition: [x] ED /[] Urgent Care (no appt availability in office) / [] Appointment(In office/virtual)/ []  [facility]/ [] Home Care/ [] Refused Recommended Disposition /[] [facility]/ []  Follow-up with PCP Additional Notes: I have referred this pt to [facility] ED.

## 2024-02-27 NOTE — Discharge Instructions (Addendum)
 Your symptoms are more consistent with inflammatory cystitis (inflammation of the bladder) rather than continued UTI.  However, we will extend your antibiotic therapy with 3 more days of Keflex  which has been sent to your pharmacy. You can trial taking over-the-counter Azo for no more than 3 days to see if this helps relieve your symptoms. Also recommend taking Tylenol  and over-the-counter ibuprofen  and staying hydrated by drinking plenty of fluids. If you develop other symptoms including back pain, or fever please return. We referred you to urology for further evaluation, please call to schedule an appointment: [facility] Address: [address] [place], [place], [place] [address] Phone: [phone]

## 2024-02-27 NOTE — ED Provider Notes (Signed)
 [name] Provider Note   CSN: [id number] Arrival date & time: 02/27/24  1237     History T2DM, HTN Chief Complaint  Patient presents with   Dysuria    Kelsey Olson is a 53 y.o. female.  Pt complains of dysuria and frequent urination for over a week despite antibiotic use.  Did have a little blood in her urine this morning. No fevers, flank pain, abdominal pain, nausea, vomiting, or diarrhea.  Does have a hx of UTIs but not frequent.  Hospitalized from 5/6-5/10 for urosepsis. Urine cx not obtained. She was discharged with 10 day course of keflex , she has one more day left to complete the course.    Dysuria       Home Medications Prior to Admission medications   Medication Sig Start Date End Date Taking? Authorizing Provider  cephALEXin  (KEFLEX ) 500 MG capsule Take 1 capsule (500 mg total) by mouth 2 (two) times daily for 3 days. 02/29/24 03/03/24 Yes Kelsey Lange, DO  acetaminophen  (TYLENOL ) 500 MG tablet Take 1,000 mg by mouth every 6 (six) hours as needed for fever or mild pain (pain score 1-3).    [provider]  atorvastatin  (LIPITOR) 40 MG tablet Take 1 tablet (40 mg total) by mouth at bedtime. 09/09/23   Kelsey Epley, FNP  cephALEXin  (KEFLEX ) 500 MG capsule Take 1 capsule (500 mg total) by mouth 2 (two) times daily for 6 days. 02/22/24 02/28/24  [place], Rema Care, DO  cholecalciferol  (VITAMIN D3) 25 MCG (1000 UNIT) tablet Take 1 tablet (1,000 Units total) by mouth daily. 09/11/21   Kelsey Epley, FNP  cyanocobalamin  100 MCG tablet Take 100 mcg by mouth daily.    [provider]  ferrous sulfate  325 (65 FE) MG EC tablet Take 1 tablet by mouth twice daily Patient taking differently: Take 1 tablet by mouth daily with breakfast. 11/11/23   Kelsey Epley, FNP  fluticasone  (FLONASE ) 50 MCG/ACT nasal spray Place 2 sprays into both nostrils daily. 05/21/23   Kelsey Epley, FNP  ibuprofen  (ADVIL ) 800 MG tablet Take 1  tablet (800 mg total) by mouth every 8 (eight) hours as needed. 12/27/22   Kelsey Epley, FNP  lisinopril -hydrochlorothiazide  (ZESTORETIC ) 20-12.5 MG tablet Take 1 tablet by mouth once daily 02/06/24   Moore, Janece, FNP  oxyCODONE  (ROXICODONE ) 5 MG immediate release tablet Take 1 tablet (5 mg total) by mouth every 8 (eight) hours as needed for moderate pain (pain score 4-6). 02/22/24 02/21/25  [place], Rema Care, DO  polyethylene glycol (MIRALAX ) 17 g packet Take 17 g by mouth daily. 02/24/24   Robinson, John K, PA-C  Semaglutide , 1 MG/DOSE, 4 MG/3ML SOPN Inject 1 mg into the skin once a week. 01/20/24   Kelsey Epley, FNP  triamcinolone  ointment (KENALOG ) 0.5 % APPLY  OINTMENT TOPICALLY TWICE DAILY Patient taking differently: Apply 1 Application topically 2 (two) times daily as needed (psoriasis). 09/20/23   Kelsey Epley, FNP      Allergies    Percocet [oxycodone -acetaminophen ]    Review of Systems   Review of Systems  Genitourinary:  Positive for dysuria.    Physical Exam Updated Vital Signs BP 118/66 (BP Location: Left Arm)   Pulse 96   Temp 98.1 F (36.7 C) (Oral)   Resp 18   SpO2 100%  Physical Exam Constitutional:      General: She is not in acute distress.    Appearance: Normal appearance.  HENT:     Mouth/Throat:  Mouth: Mucous membranes are moist.  Cardiovascular:     Rate and Rhythm: Normal rate and regular rhythm.     Heart sounds: No murmur heard. Pulmonary:     Effort: Pulmonary effort is normal. No respiratory distress.     Breath sounds: Normal breath sounds.  Abdominal:     General: Abdomen is flat. Bowel sounds are normal. There is no distension.     Palpations: Abdomen is soft.     Tenderness: There is no abdominal tenderness. There is no right CVA tenderness or left CVA tenderness.  Musculoskeletal:     Cervical back: Neck supple.  Skin:    General: Skin is warm and dry.  Neurological:     General: No focal deficit present.     Mental Status: She is alert.   Psychiatric:        Mood and Affect: Mood normal.        Behavior: Behavior normal.     ED Results / Procedures / Treatments   Labs (all labs ordered are listed, but only abnormal results are displayed) Labs Reviewed  CBC WITH DIFFERENTIAL/PLATELET - Abnormal; Notable for the following components:      Result Value   RBC 3.78 (*)    Hemoglobin 10.6 (*)    HCT 32.7 (*)    Platelets 663 (*)    Abs Immature Granulocytes 0.10 (*)    All other components within normal limits  BASIC METABOLIC PANEL WITH GFR - Abnormal; Notable for the following components:   Sodium 133 (*)    Potassium 3.3 (*)    Chloride 96 (*)    Glucose, Bld 273 (*)    Creatinine, Ser 1.12 (*)    GFR, Estimated 60 (*)    All other components within normal limits  URINALYSIS, ROUTINE W REFLEX MICROSCOPIC - Abnormal; Notable for the following components:   APPearance CLOUDY (*)    Glucose, UA >=500 (*)    Hgb urine dipstick MODERATE (*)    Protein, ur 100 (*)    Leukocytes,Ua LARGE (*)    All other components within normal limits    EKG None  Radiology No results found.  Procedures Procedures  None  Medications Ordered in ED Medications - No data to display  ED Course/ Medical Decision Making/ A&P   {                                Medical Decision Making 53 year old female presents with dysuria and increased frequency of urination which have persisted since she was discharged from hospitalization due to urosepsis earlier this month.  She has 1 more day of Keflex  to complete.  During that hospitalization and subsequent hospitalization in the ED on 5/12, CT abdomen pelvis was significant for left and right hydronephrosis, L>R, L ureter wall thickening, left perinephric and periureteric edema, irregular bladder wall thickening and extraperitoneal edema of the left pelvic sidewall which radiologist noted was compatible with infectious/inflammatory etiology but also recommended close follow-up to rule  out neoplasm.  In the ED, patient is well-appearing and vitals stable other than mild tachycardia.  Exam is reassuring against pyelonephritis and abdominal exam benign. No leukocytosis on CBC but platelets continue to be elevated, could be delayed acute phase reactant after urosepsis. CMP with mild hyponatremia and hypokalemia but no AKI. UA is without nitrites but has leukocytes and greater than 50 RBCs. Urine culture from prior ED on 5/12 reviewed and  showed no growth. Overall, this is more consistent with interstitial cystitis versus continued UTI given negative urine culture 3 days ago while on Keflex .  Very low concern for pyelonephritis in the absence of flank pain, leukocytosis and fever.   Will extend antibiotics for 3 more days and referral placed for urology for further evaluation, may need cystoscopy.  Patient advised to increase water intake, can use OTC ibuprofen  and Tylenol  for pain and trial Azo.  She advised to return if she develops flank pain or fever.  Amount and/or Complexity of Data Reviewed Discussion of management or test interpretation with external provider(s): Your symptoms are most consistent with inflammatory cystitis which is inflammation of your bladder and less likely associated with a continued urinary tract infection.  I will send in 3 more days of Keflex  to extend the course.  I recommend you follow up with urology for further evaluation. You can trial over-the-counter Azo for relief of symptoms but do not take for more than 3 days.  Risk Prescription drug management.      Final Clinical Impression(s) / ED Diagnoses Final diagnoses:  Cystitis    Rx / DC Orders ED Discharge Orders          Ordered    cephALEXin  (KEFLEX ) 500 MG capsule  2 times daily        02/27/24 1537    Ambulatory referral to Urology       Comments: Concern for interstitial cystitis s/p urosepsis. Also has hematuria.   02/27/24 1533              Kelsey Lange,  DO 02/27/24 1547    Dorenda Gandy, MD 02/29/24 1723

## 2024-02-27 NOTE — ED Triage Notes (Signed)
 Pt complaining of pain/pressure in her vaginal area. Pt states that it only hurts when she urinates. Pt states that she has the constant urge to urinate. Pt currently on antibiotics for UTI and has one more day left.

## 2024-02-27 NOTE — Telephone Encounter (Signed)
 I accidentally told pt that it was [facility] that was moving to [address].    It's [facility] that is moving there NOT [facility].  I left her a voicemail to disregard the information I gave her about the practice moving that that was for another practice.   Her doctor's office [facility] is not moving and is at the same location.    I have referred her to the ED so that's why I left her a voicemail letting her know to disregard the information I gave her about the office moving.     She was still to report to the ED.     I have sent an updated message to [facility] for Susanna Epley, FNP regarding the referral to [facility] ED.

## 2024-03-02 ENCOUNTER — Encounter: Payer: Self-pay | Admitting: Nurse Practitioner

## 2024-03-04 ENCOUNTER — Inpatient Hospital Stay: Payer: Self-pay | Admitting: Family Medicine

## 2024-03-04 ENCOUNTER — Encounter: Payer: Self-pay | Admitting: Nurse Practitioner

## 2024-03-04 ENCOUNTER — Encounter: Payer: Self-pay | Admitting: Family Medicine

## 2024-03-04 ENCOUNTER — Telehealth: Admitting: Family Medicine

## 2024-03-04 ENCOUNTER — Other Ambulatory Visit: Payer: Self-pay | Admitting: Nurse Practitioner

## 2024-03-04 DIAGNOSIS — N3090 Cystitis, unspecified without hematuria: Secondary | ICD-10-CM | POA: Diagnosis not present

## 2024-03-04 DIAGNOSIS — N130 Hydronephrosis with ureteropelvic junction obstruction: Secondary | ICD-10-CM | POA: Diagnosis not present

## 2024-03-04 DIAGNOSIS — N3021 Other chronic cystitis with hematuria: Secondary | ICD-10-CM | POA: Diagnosis not present

## 2024-03-04 DIAGNOSIS — K5900 Constipation, unspecified: Secondary | ICD-10-CM

## 2024-03-04 DIAGNOSIS — E1159 Type 2 diabetes mellitus with other circulatory complications: Secondary | ICD-10-CM

## 2024-03-04 DIAGNOSIS — E1169 Type 2 diabetes mellitus with other specified complication: Secondary | ICD-10-CM

## 2024-03-04 LAB — LAB REPORT - SCANNED: EGFR: 67.3

## 2024-03-04 MED ORDER — DAPAGLIFLOZIN PROPANEDIOL 10 MG PO TABS: 10.0000 mg | ORAL_TABLET | Freq: Every day | ORAL | 2 refills | Status: DC

## 2024-03-04 MED ORDER — FREESTYLE LIBRE 3 SENSOR MISC: 2 refills | Status: DC

## 2024-03-04 NOTE — Telephone Encounter (Signed)
 Can you send a freestyle libre? See her note

## 2024-03-04 NOTE — Assessment & Plan Note (Signed)
 Follow up with Urology. First Appointment 03/04/2024

## 2024-03-04 NOTE — Assessment & Plan Note (Signed)
 Continue scoop of miralax  daily; increase water and daily fiber.

## 2024-03-04 NOTE — Progress Notes (Addendum)
 Virtual Visit via Video Note  I,Jameka J Llittleton, CMA,acting as a scribe for [employer], NP.,have documented all relevant documentation on the behalf of Melodie Spry, NP,as directed by  Melodie Spry, NP while in the presence of Melodie Spry, NP.  I connected with Kelsey Olson on 03/04/24 at  8:20 AM EDT by a video enabled telemedicine application and verified that I am speaking with the correct person using two identifiers.  Patient Location: Home Provider Location: Office/Clinic  I discussed the limitations, risks, security, and privacy concerns of performing an evaluation and management service by video and the availability of in person appointments. I also discussed with the patient that there may be a patient responsible charge related to this service. The patient expressed understanding and agreed to proceed.  Subjective: PCP: Susanna Epley, FNP  Chief Complaint  Patient presents with   ER f/u    Patient presents today for a ER f/u. Patient reports she is improving but she still feels like her flow is not strong.   Patient states she started having chills and abdominal pains on 02/17/2024 and had Teledoc appointment where she was prescribed Macrobid but she she states that by the next day she was hurting so bad that she went to the ER and she was admitted for Urosepsis from 5/6-5/10 and discharged with 10 day course of Keflex . She reports that she had to go back to [facility] hospital on 02/23/2024 for continued abdominal pain and decreased urinary flow. She was told her UTI is clear she was referred to [facility]  urology. She states that she feels constipated and has been using miralax  daily. Patient has her first appointment this morning.     ROS: Per HPI  Current Outpatient Medications:    acetaminophen  (TYLENOL ) 500 MG tablet, Take 1,000 mg by mouth every 6 (six) hours as needed for fever or mild pain (pain score 1-3)., Disp: , Rfl:    atorvastatin  (LIPITOR) 40 MG tablet, Take 1  tablet (40 mg total) by mouth at bedtime., Disp: 90 tablet, Rfl: 1   cholecalciferol  (VITAMIN D3) 25 MCG (1000 UNIT) tablet, Take 1 tablet (1,000 Units total) by mouth daily., Disp: 30 tablet, Rfl: 2   cyanocobalamin  100 MCG tablet, Take 100 mcg by mouth daily., Disp: , Rfl:    fluticasone  (FLONASE ) 50 MCG/ACT nasal spray, Place 2 sprays into both nostrils daily., Disp: 16 g, Rfl: 2   ibuprofen  (ADVIL ) 800 MG tablet, Take 1 tablet (800 mg total) by mouth every 8 (eight) hours as needed., Disp: 30 tablet, Rfl: 2   lisinopril -hydrochlorothiazide  (ZESTORETIC ) 20-12.5 MG tablet, Take 1 tablet by mouth once daily, Disp: 90 tablet, Rfl: 0   polyethylene glycol (MIRALAX ) 17 g packet, Take 17 g by mouth daily., Disp: 14 each, Rfl: 0   Semaglutide , 1 MG/DOSE, 4 MG/3ML SOPN, Inject 1 mg into the skin once a week., Disp: 9 mL, Rfl: 1   triamcinolone  ointment (KENALOG ) 0.5 %, APPLY  OINTMENT TOPICALLY TWICE DAILY (Patient taking differently: Apply 1 Application topically 2 (two) times daily as needed (psoriasis).), Disp: 30 g, Rfl: 0   ferrous sulfate  325 (65 FE) MG EC tablet, Take 1 tablet by mouth twice daily (Patient not taking: Reported on 03/04/2024), Disp: 60 tablet, Rfl: 0   Physical Exam Neurological:     Mental Status: She is alert.     Assessment and Plan:   Follow Up Instructions: Keep next appt with PCP   I discussed the assessment and treatment plan  with the patient. The patient was provided an opportunity to ask questions, and all were answered. The patient agreed with the plan and demonstrated an understanding of the instructions.   The patient was advised to call back or seek an in-person evaluation if the symptoms worsen or if the condition fails to improve as anticipated.  The above assessment and management plan was discussed with the patient. The patient verbalized understanding of and has agreed to the management plan.   I, Melodie Spry, NP, have reviewed all documentation for  this visit. The documentation on 03/04/2024 for the exam, diagnosis, procedures, and orders are all accurate and complete.

## 2024-03-05 IMAGING — CR DG LUMBAR SPINE COMPLETE 4+V
5 series · 5 of 5 positions shown · non-contrast
Comparison: None.

CLINICAL DATA: Chronic low back pain, worse in mornings and with
bending over.

EXAM:
LUMBAR SPINE - COMPLETE 4+ upright VIEW

[w lumbar spine ap]
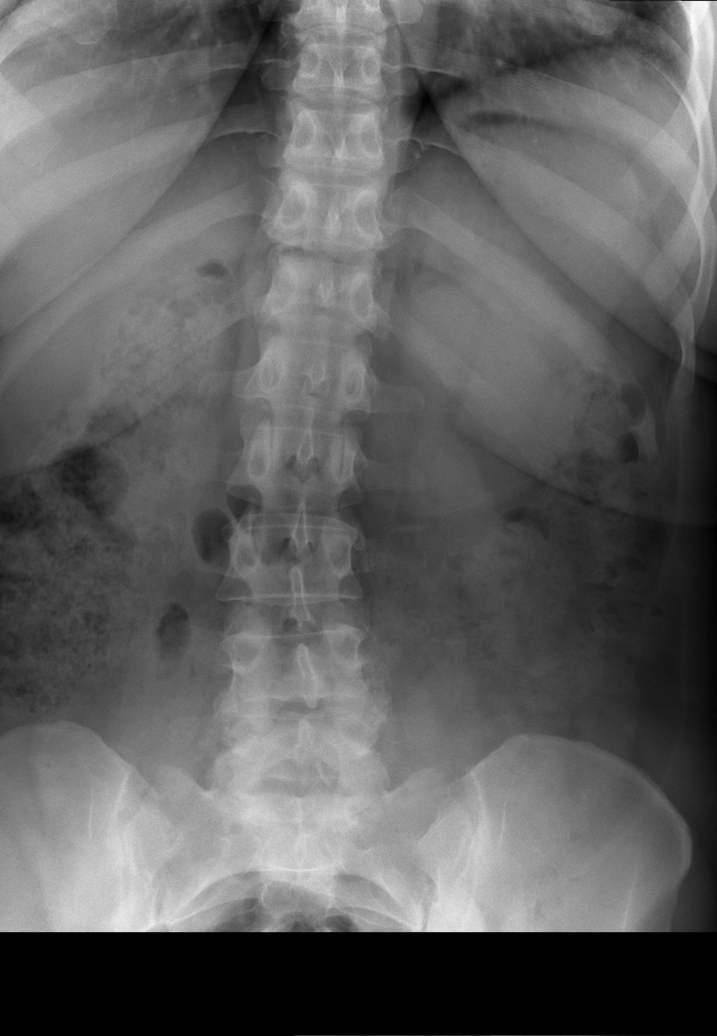

[w lumbar spine obl (1 of 2)]
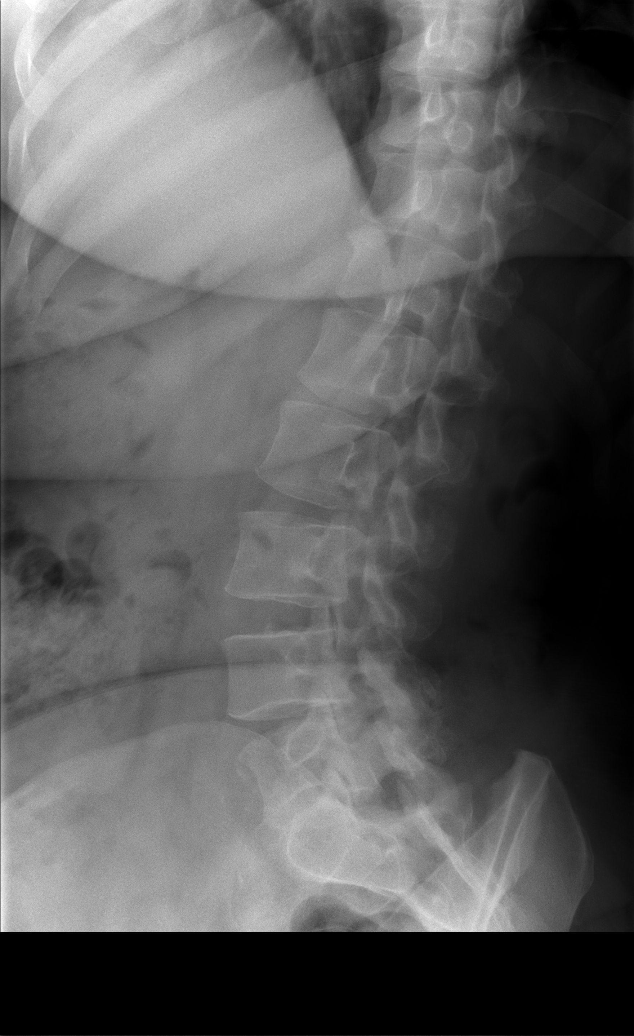

[w lumbar spine obl (2 of 2)]
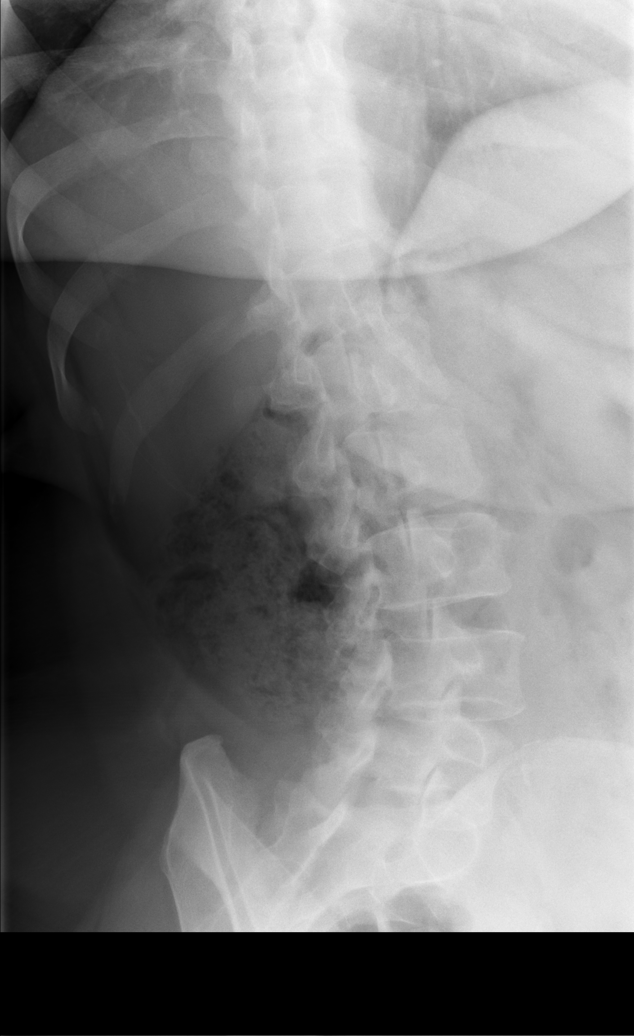

[w lumbar spine lat]
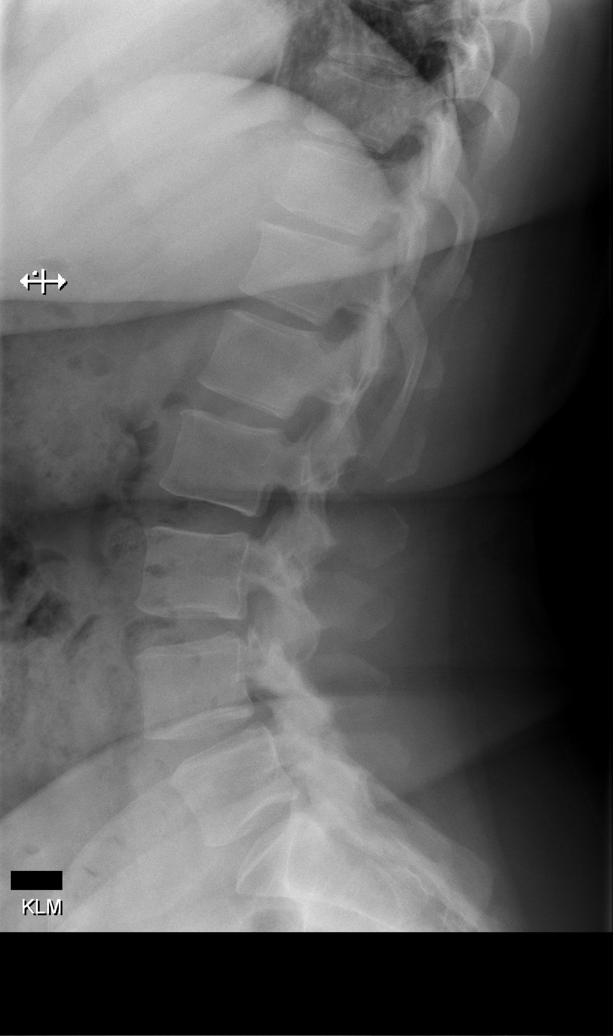

[w lumbar l-5 s-1 spot]
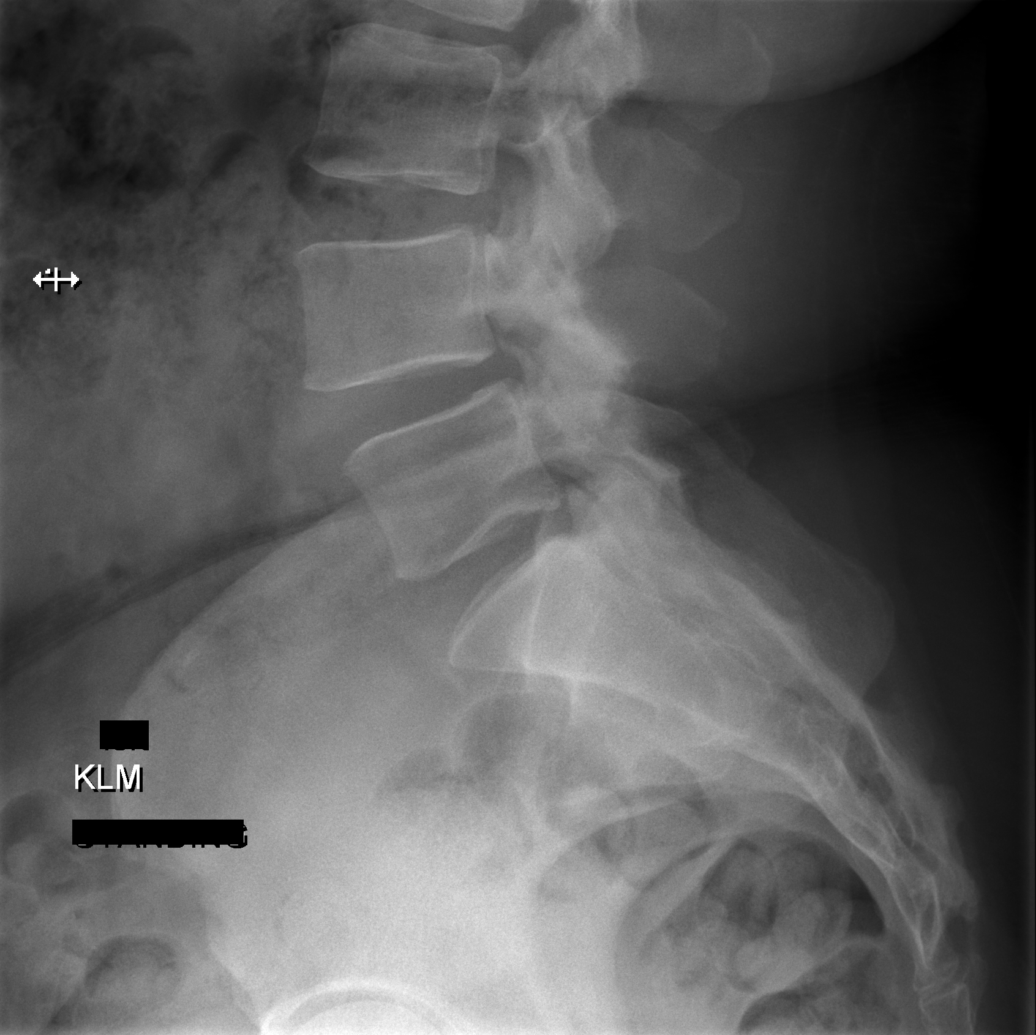

[5 of 5 positions shown; findings below may reference images not displayed]

FINDINGS: There is mild dextroscoliosis. Recent fracture is seen. Alignment of
posterior margins of vertebral bodies is unremarkable. There is no
significant disc space narrowing. Degenerative changes are noted in
the facet joints, more severe at L4-L5 and L5-S1 levels. Small
anterior bony spurs are noted at multiple levels in the lumbar spine
and lower thoracic spine.
IMPRESSION: No recent fracture is seen in the lumbar spine. Degenerative changes
are noted, particularly severe in facet joints at L4-L5 and L5-S1
levels.

## 2024-03-07 ENCOUNTER — Other Ambulatory Visit: Payer: Self-pay | Admitting: Nurse Practitioner

## 2024-03-10 ENCOUNTER — Encounter: Payer: Self-pay | Admitting: Nurse Practitioner

## 2024-03-10 ENCOUNTER — Inpatient Hospital Stay: Admitting: Family Medicine

## 2024-03-10 NOTE — Telephone Encounter (Signed)
 Call her to let her know I would like to hold off on refilling ibuprofen  since her kidney functions have recently been lower and can affect your kidneys decline. she can take tylenol  for pain as needed.

## 2024-03-11 ENCOUNTER — Other Ambulatory Visit: Payer: Self-pay

## 2024-03-11 MED ORDER — IBUPROFEN 800 MG PO TABS: 800.0000 mg | ORAL_TABLET | Freq: Three times a day (TID) | ORAL | 2 refills | Status: AC | PRN

## 2024-04-01 DIAGNOSIS — N130 Hydronephrosis with ureteropelvic junction obstruction: Secondary | ICD-10-CM | POA: Diagnosis not present

## 2024-04-01 DIAGNOSIS — N3021 Other chronic cystitis with hematuria: Secondary | ICD-10-CM | POA: Diagnosis not present

## 2024-04-01 NOTE — Addendum Note (Signed)
 Addended byJarrett Merry, Emari Hreha E on: 04/01/2024 09:21 AM   Modules accepted: Level of Service

## 2024-04-22 ENCOUNTER — Encounter: Payer: Self-pay | Admitting: Nurse Practitioner

## 2024-05-03 ENCOUNTER — Other Ambulatory Visit: Payer: Self-pay | Admitting: Nurse Practitioner

## 2024-05-03 DIAGNOSIS — I10 Essential (primary) hypertension: Secondary | ICD-10-CM

## 2024-05-04 ENCOUNTER — Other Ambulatory Visit: Payer: Self-pay | Admitting: Nurse Practitioner

## 2024-05-04 DIAGNOSIS — M7502 Adhesive capsulitis of left shoulder: Secondary | ICD-10-CM

## 2024-05-04 DIAGNOSIS — M4182 Other forms of scoliosis, cervical region: Secondary | ICD-10-CM

## 2024-05-15 ENCOUNTER — Other Ambulatory Visit: Payer: Self-pay

## 2024-05-15 DIAGNOSIS — Z6835 Body mass index (BMI) 35.0-35.9, adult: Secondary | ICD-10-CM | POA: Diagnosis not present

## 2024-05-15 DIAGNOSIS — M4316 Spondylolisthesis, lumbar region: Secondary | ICD-10-CM | POA: Diagnosis not present

## 2024-05-15 DIAGNOSIS — M4302 Spondylolysis, cervical region: Secondary | ICD-10-CM

## 2024-05-15 DIAGNOSIS — M47812 Spondylosis without myelopathy or radiculopathy, cervical region: Secondary | ICD-10-CM | POA: Diagnosis not present

## 2024-05-20 ENCOUNTER — Encounter (HOSPITAL_COMMUNITY): Payer: Self-pay

## 2024-05-20 ENCOUNTER — Other Ambulatory Visit (HOSPITAL_COMMUNITY): Payer: Self-pay

## 2024-05-20 ENCOUNTER — Encounter: Payer: Self-pay | Admitting: Nurse Practitioner

## 2024-05-20 ENCOUNTER — Ambulatory Visit: Admitting: Nurse Practitioner

## 2024-05-20 VITALS — BP 120/60 | HR 93 | Temp 99.1°F | Ht 65.0 in | Wt 217.8 lb

## 2024-05-20 DIAGNOSIS — I10 Essential (primary) hypertension: Secondary | ICD-10-CM

## 2024-05-20 DIAGNOSIS — E669 Obesity, unspecified: Secondary | ICD-10-CM | POA: Diagnosis not present

## 2024-05-20 DIAGNOSIS — Z2821 Immunization not carried out because of patient refusal: Secondary | ICD-10-CM

## 2024-05-20 DIAGNOSIS — E1169 Type 2 diabetes mellitus with other specified complication: Secondary | ICD-10-CM | POA: Diagnosis not present

## 2024-05-20 DIAGNOSIS — E785 Hyperlipidemia, unspecified: Secondary | ICD-10-CM | POA: Diagnosis not present

## 2024-05-20 DIAGNOSIS — Z139 Encounter for screening, unspecified: Secondary | ICD-10-CM

## 2024-05-20 DIAGNOSIS — Z6836 Body mass index (BMI) 36.0-36.9, adult: Secondary | ICD-10-CM | POA: Diagnosis not present

## 2024-05-20 DIAGNOSIS — E66812 Obesity, class 2: Secondary | ICD-10-CM

## 2024-05-20 MED ORDER — MOUNJARO 5 MG/0.5ML ~~LOC~~ SOAJ
5.0000 mg | SUBCUTANEOUS | 0 refills | Status: DC
  Filled 2024-05-20: qty 2, 28d supply, fill #0

## 2024-05-20 MED ORDER — ATORVASTATIN CALCIUM 40 MG PO TABS
40.0000 mg | ORAL_TABLET | Freq: Every day | ORAL | 1 refills | Status: DC
  Filled 2024-05-20: qty 90, 90d supply, fill #0

## 2024-05-20 NOTE — Patient Instructions (Addendum)
 Please call breast imaging for mammogram appointment  Also call to schedule diabetic eye exam, if you are not able to get done by end of year please let me know so we can refer to [facility] for a diabetic eye exam.

## 2024-05-20 NOTE — Assessment & Plan Note (Addendum)
 Cholesterol levels are stable. Lipid panel drawn, continue atorvastatin 

## 2024-05-20 NOTE — Progress Notes (Signed)
 [name], CMA,acting as a Neurosurgeon for Gaines Ada, FNP.,have documented all relevant documentation on the behalf of Gaines Ada, FNP,as directed by  Gaines Ada, FNP while in the presence of Gaines Ada, FNP.  Subjective:  Patient ID: Kelsey Olson , female    DOB: [birth year] , 53 y.o.   MRN: [id number]  Chief Complaint  Patient presents with   Hypertension    Patient presents today for a bp and dm follow up, Patient reports compliance with medication. Patient denies any chest pain, SOB, or headaches. Patient reports her blood sugars have been running high in the upper 200s.    Diabetes    HPI  Patient presents for dm follow up. States that she has had a lot of pain in her neck and lower back.  She is scheduled for MRI with for follow up.  Advil .    Blood sugars elevated to high 200's, on farxiga .  She states that she has not made changes with her diet.  She has a free style monitor.  Endorses polyuria.    Has been followed by urology since May for UTI, post ER visit for sepsis.  At that time she was advised to stop ozempic .  Patient states that she was told by urology that the ozempic  mechanism of action could contribute to her UTI's.  Education provided regarding mechanism of action of GLP-1 versus farxiga .    Breakfast she is having eggs and toast with bacon, she has salad 2-3 days a week.  She eats fish more often, does endorse snacking more on potato chips.  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  She is drinking at least 64 oz of water per day.    Diabetes She presents for her follow-up diabetic visit. She has type 2 diabetes mellitus. There are no hypoglycemic associated symptoms. Pertinent negatives for hypoglycemia include no headaches. Associated symptoms include polyuria. Pertinent negatives for diabetes include no chest pain, no fatigue, no polydipsia, no polyphagia and no weakness. There are no hypoglycemic complications. Risk factors  for coronary artery disease include diabetes mellitus, sedentary lifestyle and obesity. Current diabetic treatment includes oral agent (dual therapy). She is following a generally healthy diet. She has not had a previous visit with a dietitian. She participates in exercise intermittently.     Past Medical History:  Diagnosis Date   Anxiety    Arthritis    Chronic back pain    Diabetes mellitus 10/16/2003   Former smoker 02/12/2014   GERD (gastroesophageal reflux disease)    Hypertension    Obesity    Tubal pregnancy    Wears glasses      Family History  Problem Relation Age of Onset   Heart disease Mother        CHF   Diabetes Mother    Kidney disease Mother        dialysis   Arthritis Mother    COPD Father    Hepatitis C Father    Cancer Father        colon   Diabetes Sister    Hypertension Sister    Cancer Maternal Uncle        prostate   Heart disease Maternal Grandmother    Hypertension Brother    Diabetes Brother    Hypertension Brother    Breast cancer Neg Hx      Current Outpatient Medications:    acetaminophen  (TYLENOL ) 500 MG tablet, Take 1,000 mg by  mouth every 6 (six) hours as needed for fever or mild pain (pain score 1-3)., Disp: , Rfl:    cholecalciferol  (VITAMIN D3) 25 MCG (1000 UNIT) tablet, Take 1 tablet (1,000 Units total) by mouth daily., Disp: 30 tablet, Rfl: 2   Continuous Glucose Sensor (FREESTYLE LIBRE 3 SENSOR) MISC, Place 1 sensor on the skin every 14 days. Use to check glucose continuously, Disp: 2 each, Rfl: 2   cyanocobalamin  100 MCG tablet, Take 100 mcg by mouth daily., Disp: , Rfl:    dapagliflozin  propanediol (FARXIGA ) 10 MG TABS tablet, Take 1 tablet (10 mg total) by mouth daily before breakfast., Disp: 30 tablet, Rfl: 2   fluticasone  (FLONASE ) 50 MCG/ACT nasal spray, Place 2 sprays into both nostrils daily., Disp: 16 g, Rfl: 2   ibuprofen  (ADVIL ) 800 MG tablet, Take 1 tablet (800 mg total) by mouth every 8 (eight) hours as needed., Disp:  30 tablet, Rfl: 2   lisinopril -hydrochlorothiazide  (ZESTORETIC ) 20-12.5 MG tablet, Take 1 tablet by mouth once daily, Disp: 90 tablet, Rfl: 0   polyethylene glycol (MIRALAX ) 17 g packet, Take 17 g by mouth daily., Disp: 14 each, Rfl: 0   tirzepatide  (MOUNJARO ) 5 MG/0.5ML Pen, Inject 5 mg into the skin once a week., Disp: 2 mL, Rfl: 0   triamcinolone  ointment (KENALOG ) 0.5 %, APPLY  OINTMENT TOPICALLY TWICE DAILY (Patient taking differently: Apply 1 Application topically 2 (two) times daily as needed (psoriasis).), Disp: 30 g, Rfl: 0   atorvastatin  (LIPITOR) 40 MG tablet, Take 1 tablet (40 mg total) by mouth at bedtime., Disp: 90 tablet, Rfl: 1   Allergies  Allergen Reactions   Percocet [Oxycodone -Acetaminophen ] Itching and Other (See Comments)    Hallucinations     Review of Systems  Constitutional:  Negative for chills, fatigue and fever.  Respiratory:  Negative for cough, chest tightness and shortness of breath.   Cardiovascular:  Negative for chest pain, palpitations and leg swelling.  Gastrointestinal:  Negative for abdominal pain, constipation, diarrhea, nausea and vomiting.  Endocrine: Positive for polyuria. Negative for polydipsia and polyphagia.  Genitourinary:  Negative for dysuria, frequency and urgency.  Musculoskeletal:  Negative for arthralgias and myalgias.  Neurological:  Negative for weakness, light-headedness and headaches.     Today's Vitals   05/20/24 0830  BP: 120/60  Pulse: 93  Temp: 99.1 F (37.3 C)  TempSrc: Oral  Weight: 217 lb 12.8 oz (98.8 kg)  Height: 5' 5 (1.651 m)  PainSc: 4   PainLoc: Back   Body mass index is 36.24 kg/m.  Wt Readings from Last 3 Encounters:  05/20/24 217 lb 12.8 oz (98.8 kg)  02/18/24 210 lb (95.3 kg)  01/16/24 220 lb 9.6 oz (100.1 kg)    The 10-year ASCVD risk score (Arnett DK, et al., 2019) is: 7.3%   Values used to calculate the score:     Age: 60 years     Clincally relevant sex: Female     Is Non-Hispanic African  American: Yes     Diabetic: Yes     Tobacco smoker: No     Systolic Blood Pressure: 120 mmHg     Is BP treated: Yes     HDL Cholesterol: 43 mg/dL     Total Cholesterol: 154 mg/dL  Objective:  Physical Exam Constitutional:      General: She is not in acute distress.    Appearance: She is obese.  HENT:     Head: Normocephalic and atraumatic.  Neck:     Vascular: No  carotid bruit.  Cardiovascular:     Rate and Rhythm: Normal rate and regular rhythm.     Pulses: Normal pulses.     Heart sounds: Normal heart sounds. No murmur heard. Pulmonary:     Effort: Pulmonary effort is normal. No respiratory distress.     Breath sounds: Normal breath sounds. No wheezing.  Musculoskeletal:     Right lower leg: No edema.     Left lower leg: No edema.  Lymphadenopathy:     Cervical: No cervical adenopathy.  Skin:    General: Skin is warm and dry.     Capillary Refill: Capillary refill takes less than 2 seconds.  Neurological:     General: No focal deficit present.     Mental Status: She is alert and oriented to person, place, and time. Mental status is at baseline.  Psychiatric:        Mood and Affect: Mood normal.        Behavior: Behavior normal.        Thought Content: Thought content normal.        Judgment: Judgment normal.      Assessment And Plan:  Type 2 diabetes mellitus with obesity (HCC) Assessment & Plan: A1c was elevated at last visit, will Restart Mounjaro  5 mg pending insurance approval. Was on Ozempic  1 mg until she spoke with the Urologist who recommended she stop taking due to the risk for UTI. Typically GLP1s do not increase the risk for UTIs but SGLT2s do. She does feel more vaginal irritation with farxiga  this could be due to elevated blood sugars or Farxiga , advised to make sure to drink at least 8 oz water with her Farxiga .  Will evaluate HgbA1C due to increased blood sugars since May 2025.    Orders: -     Hemoglobin A1c -     Mounjaro ; Inject 5 mg into the skin  once a week.  Dispense: 2 mL; Refill: 0  Dyslipidemia Assessment & Plan: Cholesterol levels are stable. Lipid panel drawn, continue atorvastatin    Orders: -     Lipid panel -     Atorvastatin  Calcium ; Take 1 tablet (40 mg total) by mouth at bedtime.  Dispense: 90 tablet; Refill: 1  Essential hypertension Assessment & Plan: Chronic, BP well controlled.  Continue zestortic as prescribed. Will check eGFR  Orders: -     BMP8+eGFR  COVID-19 vaccination declined Assessment & Plan: Declines covid 19 vaccine. Discussed risk of covid 41 and if she changes her mind about the vaccine to call the office. Education has been provided regarding the importance of this vaccine but patient still declined. Advised may receive this vaccine at local pharmacy or Health Dept.or vaccine clinic. Aware to provide a copy of the vaccination record if obtained from local pharmacy or Health Dept.  Encouraged to take multivitamin, vitamin d , vitamin c and zinc to increase immune system. Aware can call office if would like to have vaccine here at office. Verbalized acceptance and understanding.    Herpes zoster vaccination declined Assessment & Plan: Declines shingrix, educated on disease process and is aware if he changes his mind to notify office    Encounter for screening -     Hepatitis B surface antibody,qualitative  Tetanus, diphtheria, and acellular pertussis (Tdap) vaccination declined  Class 2 severe obesity due to excess calories with serious comorbidity and body mass index (BMI) of 36.0 to 36.9 in adult [facility]) Assessment & Plan: She is encouraged to continue to strive for BMI  less than 30 to decrease cardiac risk. Advised to aim for at least 150 minutes of exercise per week.      Return for controlled DM check 4 months.  Patient was given opportunity to ask questions. Patient verbalized understanding of the plan and was able to repeat key elements of the plan. All questions were answered to their  satisfaction.   I have reviewed this encounter including the documentation in this note and/or discussed this patient with Delon Louder FNP Student. I am certifying that I agree with the content of this note as the primary care nurse practitioner.  Gaines Ada, DNP, FNP-BC   I, Gaines Ada, FNP, have reviewed all documentation for this visit. The documentation on 05/20/24 for the exam, diagnosis, procedures, and orders are all accurate and complete.   IF YOU HAVE BEEN REFERRED TO A SPECIALIST, IT MAY TAKE 1-2 WEEKS TO SCHEDULE/PROCESS THE REFERRAL. IF YOU HAVE NOT HEARD FROM US /SPECIALIST IN TWO WEEKS, PLEASE GIVE US  A CALL AT [phone].

## 2024-05-20 NOTE — Assessment & Plan Note (Addendum)
 She is encouraged to continue to strive for BMI less than 30 to decrease cardiac risk. Advised to aim for at least 150 minutes of exercise per week.

## 2024-05-20 NOTE — Assessment & Plan Note (Addendum)
 A1c was elevated at last visit, will Restart Mounjaro  5 mg pending insurance approval. Was on Ozempic  1 mg until she spoke with the Urologist who recommended she stop taking due to the risk for UTI. Typically GLP1s do not increase the risk for UTIs but SGLT2s do. She does feel more vaginal irritation with farxiga  this could be due to elevated blood sugars or Farxiga , advised to make sure to drink at least 8 oz water with her Farxiga .  Will evaluate HgbA1C due to increased blood sugars since May 2025.

## 2024-05-20 NOTE — Assessment & Plan Note (Signed)
 Declines shingrix, educated on disease process and is aware if he changes his mind to notify office

## 2024-05-20 NOTE — Assessment & Plan Note (Signed)

## 2024-05-20 NOTE — Assessment & Plan Note (Addendum)
 Chronic, BP well controlled.  Continue zestortic as prescribed. Will check eGFR

## 2024-05-21 LAB — BMP8+EGFR
BUN/Creatinine Ratio: 15 (ref 9–23)
BUN: 18 mg/dL (ref 6–24)
CO2: 20 mmol/L (ref 20–29)
Calcium: 10 mg/dL (ref 8.7–10.2)
Chloride: 93 mmol/L — ABNORMAL LOW (ref 96–106)
Creatinine, Ser: 1.17 mg/dL — ABNORMAL HIGH (ref 0.57–1.00)
Glucose: 231 mg/dL — ABNORMAL HIGH (ref 70–99)
Potassium: 4.4 mmol/L (ref 3.5–5.2)
Sodium: 133 mmol/L — ABNORMAL LOW (ref 134–144)
eGFR: 56 mL/min/1.73 — ABNORMAL LOW (ref >59)

## 2024-05-21 LAB — LIPID PANEL
Chol/HDL Ratio: 5.6 ratio — ABNORMAL HIGH (ref 0.0–4.4)
Cholesterol, Total: 245 mg/dL — ABNORMAL HIGH (ref 100–199)
HDL: 44 mg/dL (ref >39)
LDL Chol Calc (NIH): 171 mg/dL — ABNORMAL HIGH (ref 0–99)
Triglycerides: 162 mg/dL — ABNORMAL HIGH (ref 0–149)
VLDL Cholesterol Cal: 30 mg/dL (ref 5–40)

## 2024-05-21 LAB — HEMOGLOBIN A1C
Est. average glucose Bld gHb Est-mCnc: 252 mg/dL
Hgb A1c MFr Bld: 10.4 % — ABNORMAL HIGH (ref 4.8–5.6)

## 2024-05-21 LAB — HEPATITIS B SURFACE ANTIBODY,QUALITATIVE: Hep B Surface Ab, Qual: NONREACTIVE

## 2024-05-29 ENCOUNTER — Other Ambulatory Visit: Payer: Self-pay | Admitting: Nurse Practitioner

## 2024-05-29 ENCOUNTER — Other Ambulatory Visit (HOSPITAL_COMMUNITY): Payer: Self-pay

## 2024-05-29 DIAGNOSIS — E1169 Type 2 diabetes mellitus with other specified complication: Secondary | ICD-10-CM

## 2024-06-12 ENCOUNTER — Encounter: Payer: Self-pay | Admitting: Nurse Practitioner

## 2024-06-16 ENCOUNTER — Ambulatory Visit: Payer: Self-pay | Admitting: Nurse Practitioner

## 2024-06-16 DIAGNOSIS — M5416 Radiculopathy, lumbar region: Secondary | ICD-10-CM | POA: Diagnosis not present

## 2024-06-16 NOTE — Telephone Encounter (Signed)
 Can you check on her PA for Mounjaro  please?

## 2024-06-26 ENCOUNTER — Other Ambulatory Visit: Payer: Self-pay | Admitting: Nurse Practitioner

## 2024-06-26 DIAGNOSIS — E669 Obesity, unspecified: Secondary | ICD-10-CM

## 2024-06-30 ENCOUNTER — Other Ambulatory Visit: Payer: Self-pay | Admitting: Nurse Practitioner

## 2024-06-30 MED ORDER — TRULICITY 1.5 MG/0.5ML ~~LOC~~ SOAJ: 1.5000 mg | SUBCUTANEOUS | 1 refills | Status: DC

## 2024-07-30 ENCOUNTER — Other Ambulatory Visit: Payer: Self-pay | Admitting: Nurse Practitioner

## 2024-07-30 DIAGNOSIS — E669 Obesity, unspecified: Secondary | ICD-10-CM

## 2024-07-30 DIAGNOSIS — I10 Essential (primary) hypertension: Secondary | ICD-10-CM

## 2024-08-04 NOTE — Therapy (Incomplete)
 OUTPATIENT PHYSICAL THERAPY THORACOLUMBAR EVALUATION   Patient Name: Kelsey Olson MRN: [id number] DOB:09-Mar-1971, 53 y.o., female Today's Date: 08/04/2024  END OF SESSION:   Past Medical History:  Diagnosis Date   Anxiety    Arthritis    Chronic back pain    Diabetes mellitus 10/16/2003   Former smoker 02/12/2014   GERD (gastroesophageal reflux disease)    Hypertension    Obesity    Tubal pregnancy    Wears glasses    Past Surgical History:  Procedure Laterality Date   ECTOPIC PREGNANCY SURGERY  1999   right   Patient Active Problem List   Diagnosis Date Noted   Type 2 diabetes mellitus with obesity 05/20/2024   Cystitis 03/04/2024   Constipation in female 03/04/2024   Sepsis due to urinary tract infection (HCC) 02/19/2024   AKI (acute kidney injury) 02/19/2024   Adhesive capsulitis of left shoulder 01/16/2024   Encounter for screening mammogram for breast cancer 01/16/2024   Encounter for annual health examination 01/16/2024   Herpes zoster vaccination declined 09/09/2023   COVID-19 vaccination declined 09/09/2023   BMI 37.0-37.9, adult 09/09/2023   Vitamin D  deficiency 01/15/2023   Obesity, diabetes, and hypertension syndrome (HCC) 01/15/2023   Dyslipidemia 07/11/2017   Dermatitis 11/26/2016   Flu-like symptoms 11/16/2016   Type 2 diabetes mellitus with complication, without long-term current use of insulin  (HCC) 11/16/2016   Noncompliance 11/16/2016   Influenza vaccination declined 12/06/2015   Pneumococcal vaccination declined 12/06/2015   Essential hypertension 10/01/2014   Obesity with serious comorbidity 10/01/2014    PCP: Georgina Speaks, FN  REFERRING PROVIDER: Darnella Dorn SAUNDERS, MD   REFERRING DIAG: M54.16 (ICD-10-CM) - Lumbar radiculopathy   Rationale for Evaluation and Treatment: Rehabilitation  THERAPY DIAG:  No diagnosis found.  ONSET DATE: ***  SUBJECTIVE:                                                                                                                                                                                            SUBJECTIVE STATEMENT: ***  PERTINENT HISTORY:  ***  PAIN:  Are you having pain? Yes: NPRS scale: *** Pain location: *** Pain description: *** Aggravating factors: *** Relieving factors: ***  PRECAUTIONS: {Therapy precautions:24002}  RED FLAGS: {PT Red Flags:29287}   WEIGHT BEARING RESTRICTIONS: {Yes ***/No:24003}  FALLS:  Has patient fallen in last 6 months? {fallsyesno:27318}  LIVING ENVIRONMENT: Lives with: {[facility] lives with:25569::lives with their family} Lives in: {Lives in:25570} Stairs: {opstairs:27293} Has following equipment at home: {Assistive devices:23999}  OCCUPATION: ***  PLOF: {PLOF:24004}  PATIENT GOALS: ***  NEXT MD VISIT: ***  OBJECTIVE:  Note: Objective measures  were completed at Evaluation unless otherwise noted.  DIAGNOSTIC FINDINGS:  12/28/21 DG lumbar spine IMPRESSION: No recent fracture is seen in the lumbar spine. Degenerative changes are noted, particularly severe in facet joints at L4-L5 and L5-S1 levels.  PATIENT SURVEYS:  {rehab surveys:24030}  COGNITION: Overall cognitive status: {cognition:24006}     SENSATION: {sensation:27233}  MUSCLE LENGTH: Hamstrings: Right *** deg; Left *** deg Debby test: Right *** deg; Left *** deg  POSTURE: {posture:25561}  PALPATION: ***  LUMBAR ROM:   AROM eval  Flexion   Extension   Right lateral flexion   Left lateral flexion   Right rotation   Left rotation    (Blank rows = not tested)  LOWER EXTREMITY ROM:     {AROM/PROM:27142}  Right eval Left eval  Hip flexion    Hip extension    Hip abduction    Hip adduction    Hip internal rotation    Hip external rotation    Knee flexion    Knee extension    Ankle dorsiflexion    Ankle plantarflexion    Ankle inversion    Ankle eversion     (Blank rows = not tested)  LOWER EXTREMITY MMT:    MMT Right eval  Left eval  Hip flexion    Hip extension    Hip abduction    Hip adduction    Hip internal rotation    Hip external rotation    Knee flexion    Knee extension    Ankle dorsiflexion    Ankle plantarflexion    Ankle inversion    Ankle eversion     (Blank rows = not tested)  LUMBAR SPECIAL TESTS:  {lumbar special test:25242}  FUNCTIONAL TESTS:  {Functional tests:24029}  GAIT: Distance walked: *** Assistive device utilized: {Assistive devices:23999} Level of assistance: {Levels of assistance:24026} Comments: ***  TREATMENT DATE:  [facility] Adult PT Treatment:                                                DATE: 1//22/25 Therapeutic Exercise: *** Manual Therapy: *** Neuromuscular re-ed: *** Therapeutic Activity: *** Modalities: *** Self Care: ***                                                                                                                                  PATIENT EDUCATION:  Education details: *** Person educated: {Person educated:25204} Education method: {Education Method:25205} Education comprehension: {Education Comprehension:25206}  HOME EXERCISE PROGRAM: ***  ASSESSMENT:  CLINICAL IMPRESSION: Patient is a 53 y.o. female who was seen today for physical therapy evaluation and treatment for M54.16 (ICD-10-CM) - Lumbar radiculopathy .   OBJECTIVE IMPAIRMENTS: {opptimpairments:25111}.   ACTIVITY LIMITATIONS: {activitylimitations:27494}  PARTICIPATION LIMITATIONS: {participationrestrictions:25113}  PERSONAL FACTORS: {Personal factors:25162} are also affecting patient's functional outcome.   REHAB POTENTIAL: {rehabpotential:25112}  CLINICAL DECISION MAKING: {clinical decision making:25114}  EVALUATION COMPLEXITY: {Evaluation complexity:25115}   GOALS:   SHORT TERM GOALS: Target date: ***  *** Baseline: Goal status: INITIAL  2.  *** Baseline:  Goal status: INITIAL  3.  *** Baseline:  Goal status: INITIAL  4.   *** Baseline:  Goal status: INITIAL  5.  *** Baseline:  Goal status: INITIAL  6.  *** Baseline:  Goal status: INITIAL  LONG TERM GOALS: Target date: ***  *** Baseline:  Goal status: INITIAL  2.  *** Baseline:  Goal status: INITIAL  3.  *** Baseline:  Goal status: INITIAL  4.  *** Baseline:  Goal status: INITIAL  5.  *** Baseline:  Goal status: INITIAL  6.  *** Baseline:  Goal status: INITIAL  PLAN:  PT FREQUENCY: {rehab frequency:25116}  PT DURATION: {rehab duration:25117}  PLANNED INTERVENTIONS: {rehab planned interventions:25118::97110-Therapeutic exercises,97530- Therapeutic [phone]- Neuromuscular re-education,97535- Self Rjmz,02859- Manual therapy,Patient/Family education}.  PLAN FOR NEXT SESSION: ***   Dasie Daft, PT 08/04/2024, 10:23 PM

## 2024-08-05 ENCOUNTER — Ambulatory Visit

## 2024-08-21 ENCOUNTER — Other Ambulatory Visit: Payer: Self-pay | Admitting: Nurse Practitioner

## 2024-08-21 DIAGNOSIS — E669 Obesity, unspecified: Secondary | ICD-10-CM

## 2024-09-02 ENCOUNTER — Encounter: Payer: Self-pay | Admitting: Nurse Practitioner

## 2024-09-22 ENCOUNTER — Ambulatory Visit: Payer: Self-pay | Admitting: Family Medicine

## 2024-11-09 ENCOUNTER — Ambulatory Visit: Admitting: Nurse Practitioner

## 2024-11-11 ENCOUNTER — Telehealth: Admitting: Nurse Practitioner

## 2024-11-11 ENCOUNTER — Encounter: Payer: Self-pay | Admitting: Nurse Practitioner

## 2024-11-11 DIAGNOSIS — E1169 Type 2 diabetes mellitus with other specified complication: Secondary | ICD-10-CM | POA: Diagnosis not present

## 2024-11-11 DIAGNOSIS — I10 Essential (primary) hypertension: Secondary | ICD-10-CM

## 2024-11-11 DIAGNOSIS — E785 Hyperlipidemia, unspecified: Secondary | ICD-10-CM

## 2024-11-11 DIAGNOSIS — E669 Obesity, unspecified: Secondary | ICD-10-CM | POA: Diagnosis not present

## 2024-11-11 DIAGNOSIS — E119 Type 2 diabetes mellitus without complications: Secondary | ICD-10-CM

## 2024-11-11 MED ORDER — MOUNJARO 5 MG/0.5ML ~~LOC~~ SOAJ: 5.0000 mg | SUBCUTANEOUS | 0 refills | Status: AC

## 2024-11-11 MED ORDER — TRULICITY 3 MG/0.5ML ~~LOC~~ SOAJ: 3.0000 mg | SUBCUTANEOUS | 1 refills | Status: DC

## 2024-11-11 MED ORDER — ATORVASTATIN CALCIUM 40 MG PO TABS: 40.0000 mg | ORAL_TABLET | Freq: Every day | ORAL | 1 refills | Status: AC

## 2024-11-11 MED ORDER — FREESTYLE LIBRE 3 SENSOR MISC: 11 refills | Status: AC

## 2024-11-11 MED ORDER — LISINOPRIL-HYDROCHLOROTHIAZIDE 20-12.5 MG PO TABS: 1.0000 | ORAL_TABLET | Freq: Every day | ORAL | 1 refills | Status: AC

## 2024-11-11 MED ORDER — DAPAGLIFLOZIN PROPANEDIOL 10 MG PO TABS: 10.0000 mg | ORAL_TABLET | Freq: Every day | ORAL | 2 refills | Status: AC

## 2024-11-11 NOTE — Progress Notes (Signed)
 "  Virtual Visit via Video Note  Kelsey Olson, CMA,acting as a scribe for Olson Ada, FNP.,have documented all relevant documentation on the behalf of Olson Ada, FNP,as directed by  Olson Ada, FNP while in the presence of Olson Ada, FNP.  I connected with Kelsey Olson on 11/11/2024 at  4:20 PM EST by a video enabled telemedicine application and verified that I am speaking with the correct person using two identifiers.  Patient Location: Home Provider Location: Home Office  I discussed the limitations, risks, security, and privacy concerns of performing an evaluation and management service by video and the availability of in person appointments. I also discussed with the patient that there may be a patient responsible charge related to this service. The patient expressed understanding and agreed to proceed.  Subjective: PCP: Kelsey Gaines, FNP  Chief Complaint  Patient presents with   Hypertension    Patient presents today for a bp and dm follow up, Patient reports compliance with medication. Patient denies any chest pain, SOB, or headaches. Patient has no concerns today.    She has an appt with ophthalmology in April. Her blood sugars have been up to 250's. With a heavy meal her blood sugar has been over 300.   Diabetes She presents for her follow-up diabetic visit. She has type 2 diabetes mellitus. Hypoglycemia symptoms include hunger. Pertinent negatives for hypoglycemia include no headaches. Associated symptoms include polyuria. Pertinent negatives for diabetes include no chest pain, no fatigue, no polydipsia, no polyphagia and no weakness. There are no hypoglycemic complications. Risk factors for coronary artery disease include diabetes mellitus, sedentary lifestyle and obesity. Current diabetic treatment includes oral agent (dual therapy). She is following a generally healthy diet. She has not had a previous visit with a dietitian. She participates in exercise  intermittently.     ROS: Per HPI Current Medications[1]  Observations/Objective: There were no vitals filed for this visit. Physical Exam Vitals and nursing note reviewed.  Constitutional:      General: She is not in acute distress.    Appearance: Normal appearance. She is obese.  Pulmonary:     Effort: Pulmonary effort is normal. No respiratory distress.  Skin:    Capillary Refill: Capillary refill takes less than 2 seconds.  Neurological:     General: No focal deficit present.     Mental Status: She is alert and oriented to person, place, and time.     Cranial Nerves: No cranial nerve deficit.  Psychiatric:        Mood and Affect: Mood normal.        Behavior: Behavior normal.        Thought Content: Thought content normal.        Judgment: Judgment normal.     Assessment and Plan: Type 2 diabetes mellitus in patient with obesity (HCC) -     Hemoglobin A1c; Future -     Mounjaro ; Inject 5 mg into the skin once a week.  Dispense: 2 mL; Refill: 0 -     FreeStyle Libre 3 Sensor; Place 1 sensor on the skin every 14 days. Use to check glucose continuously  Dispense: 2 each; Refill: 11  Essential hypertension Assessment & Plan: Chronic, BP well controlled.  Continue zestortic as prescribed. Will check eGFR  Orders: -     BMP8+eGFR; Future -     Lisinopril -hydroCHLOROthiazide ; Take 1 tablet by mouth daily.  Dispense: 90 tablet; Refill: 1  Dyslipidemia Assessment & Plan: Cholesterol levels are stable. Lipid  panel drawn, continue atorvastatin    Orders: -     Lipid panel; Future -     Atorvastatin  Calcium ; Take 1 tablet (40 mg total) by mouth at bedtime.  Dispense: 90 tablet; Refill: 1  Obesity, diabetes, and hypertension syndrome (HCC) Assessment & Plan: Her blood sugars have been increasing on the current dose of Trulicity , will try to change her over to Mounjaro  since she has a new insurance. She will come in a couple weeks for labs due to the weather  Orders: -      Dapagliflozin  Propanediol; Take 1 tablet (10 mg total) by mouth daily before breakfast.  Dispense: 30 tablet; Refill: 2  Essential hypertension Assessment & Plan: Chronic, BP well controlled.  Continue zestortic as prescribed. Will check eGFR  Orders: -     BMP8+eGFR; Future -     Lisinopril -hydroCHLOROthiazide ; Take 1 tablet by mouth daily.  Dispense: 90 tablet; Refill: 1    Follow Up Instructions: Return if symptoms worsen or fail to improve.   I discussed the assessment and treatment plan with the patient. The patient was provided an opportunity to ask questions, and all were answered. The patient agreed with the plan and demonstrated an understanding of the instructions.   The patient was advised to call back or seek an in-person evaluation if the symptoms worsen or if the condition fails to improve as anticipated.  The above assessment and management plan was discussed with the patient. The patient verbalized understanding of and has agreed to the management plan.   Kelsey Olson Ada, FNP, have reviewed all documentation for this visit. The documentation on 11/11/2024 for the exam, diagnosis, procedures, and orders are all accurate and complete.      [1]  Current Outpatient Medications:    acetaminophen  (TYLENOL ) 500 MG tablet, Take 1,000 mg by mouth every 6 (six) hours as needed for fever or mild pain (pain score 1-3)., Disp: , Rfl:    cholecalciferol  (VITAMIN D3) 25 MCG (1000 UNIT) tablet, Take 1 tablet (1,000 Units total) by mouth daily., Disp: 30 tablet, Rfl: 2   cyanocobalamin  100 MCG tablet, Take 100 mcg by mouth daily., Disp: , Rfl:    fluticasone  (FLONASE ) 50 MCG/ACT nasal spray, Place 2 sprays into both nostrils daily., Disp: 16 g, Rfl: 2   ibuprofen  (ADVIL ) 800 MG tablet, Take 1 tablet (800 mg total) by mouth every 8 (eight) hours as needed., Disp: 30 tablet, Rfl: 2   polyethylene glycol (MIRALAX ) 17 g packet, Take 17 g by mouth daily., Disp: 14 each, Rfl: 0    tirzepatide  (MOUNJARO ) 5 MG/0.5ML Pen, Inject 5 mg into the skin once a week., Disp: 2 mL, Rfl: 0   triamcinolone  ointment (KENALOG ) 0.5 %, APPLY  OINTMENT TOPICALLY TWICE DAILY (Patient taking differently: Apply 1 Application topically 2 (two) times daily as needed (psoriasis).), Disp: 30 g, Rfl: 0   atorvastatin  (LIPITOR) 40 MG tablet, Take 1 tablet (40 mg total) by mouth at bedtime., Disp: 90 tablet, Rfl: 1   Continuous Glucose Sensor (FREESTYLE LIBRE 3 SENSOR) MISC, Place 1 sensor on the skin every 14 days. Use to check glucose continuously, Disp: 2 each, Rfl: 11   dapagliflozin  propanediol (FARXIGA ) 10 MG TABS tablet, Take 1 tablet (10 mg total) by mouth daily before breakfast., Disp: 30 tablet, Rfl: 2   lisinopril -hydrochlorothiazide  (ZESTORETIC ) 20-12.5 MG tablet, Take 1 tablet by mouth daily., Disp: 90 tablet, Rfl: 1  "

## 2024-11-13 NOTE — Assessment & Plan Note (Signed)
 Cholesterol levels are stable. Lipid panel drawn, continue atorvastatin 

## 2024-11-13 NOTE — Assessment & Plan Note (Signed)
 Chronic, BP well controlled.  Continue zestortic as prescribed. Will check eGFR

## 2024-11-13 NOTE — Assessment & Plan Note (Signed)
 Her blood sugars have been increasing on the current dose of Trulicity , will try to change her over to Mounjaro  since she has a new insurance. She will come in a couple weeks for labs due to the weather

## 2025-01-20 ENCOUNTER — Encounter: Payer: Self-pay | Admitting: Nurse Practitioner
